# Patient Record
Sex: Female | Born: 1960 | Race: White | Hispanic: No | Marital: Married | Smoking: Never smoker
Health system: Southern US, Community
[De-identification: ages and names within clinical notes are randomized; demographics above are authoritative.]

## PROBLEM LIST (undated history)

## (undated) DIAGNOSIS — Z862 Personal history of diseases of the blood and blood-forming organs and certain disorders involving the immune mechanism: Secondary | ICD-10-CM

## (undated) DIAGNOSIS — Z860100 Personal history of colon polyps, unspecified: Secondary | ICD-10-CM

## (undated) DIAGNOSIS — E119 Type 2 diabetes mellitus without complications: Secondary | ICD-10-CM

## (undated) DIAGNOSIS — K219 Gastro-esophageal reflux disease without esophagitis: Secondary | ICD-10-CM

## (undated) DIAGNOSIS — M5126 Other intervertebral disc displacement, lumbar region: Secondary | ICD-10-CM

## (undated) DIAGNOSIS — K7581 Nonalcoholic steatohepatitis (NASH): Secondary | ICD-10-CM

## (undated) DIAGNOSIS — F419 Anxiety disorder, unspecified: Secondary | ICD-10-CM

## (undated) DIAGNOSIS — Z8619 Personal history of other infectious and parasitic diseases: Secondary | ICD-10-CM

## (undated) DIAGNOSIS — F32A Depression, unspecified: Secondary | ICD-10-CM

## (undated) DIAGNOSIS — G4730 Sleep apnea, unspecified: Secondary | ICD-10-CM

## (undated) DIAGNOSIS — I10 Essential (primary) hypertension: Secondary | ICD-10-CM

## (undated) DIAGNOSIS — M5030 Other cervical disc degeneration, unspecified cervical region: Secondary | ICD-10-CM

## (undated) DIAGNOSIS — Z9889 Other specified postprocedural states: Secondary | ICD-10-CM

## (undated) DIAGNOSIS — J40 Bronchitis, not specified as acute or chronic: Secondary | ICD-10-CM

## (undated) DIAGNOSIS — R200 Anesthesia of skin: Secondary | ICD-10-CM

## (undated) DIAGNOSIS — S0300XA Dislocation of jaw, unspecified side, initial encounter: Secondary | ICD-10-CM

## (undated) DIAGNOSIS — T7840XA Allergy, unspecified, initial encounter: Secondary | ICD-10-CM

## (undated) DIAGNOSIS — K589 Irritable bowel syndrome without diarrhea: Secondary | ICD-10-CM

## (undated) DIAGNOSIS — M5136 Other intervertebral disc degeneration, lumbar region: Secondary | ICD-10-CM

## (undated) DIAGNOSIS — N901 Moderate vulvar dysplasia: Secondary | ICD-10-CM

## (undated) DIAGNOSIS — H6690 Otitis media, unspecified, unspecified ear: Secondary | ICD-10-CM

## (undated) DIAGNOSIS — F329 Major depressive disorder, single episode, unspecified: Secondary | ICD-10-CM

## (undated) DIAGNOSIS — R112 Nausea with vomiting, unspecified: Secondary | ICD-10-CM

## (undated) DIAGNOSIS — B159 Hepatitis A without hepatic coma: Secondary | ICD-10-CM

## (undated) DIAGNOSIS — E785 Hyperlipidemia, unspecified: Secondary | ICD-10-CM

## (undated) DIAGNOSIS — Z973 Presence of spectacles and contact lenses: Secondary | ICD-10-CM

## (undated) DIAGNOSIS — W57XXXA Bitten or stung by nonvenomous insect and other nonvenomous arthropods, initial encounter: Secondary | ICD-10-CM

## (undated) DIAGNOSIS — M51369 Other intervertebral disc degeneration, lumbar region without mention of lumbar back pain or lower extremity pain: Secondary | ICD-10-CM

## (undated) HISTORY — PX: WRIST SURGERY: SHX841

## (undated) HISTORY — DX: Allergy, unspecified, initial encounter: T78.40XA

## (undated) HISTORY — PX: TUBAL LIGATION: SHX77

## (undated) HISTORY — DX: Depression, unspecified: F32.A

## (undated) HISTORY — DX: Anxiety disorder, unspecified: F41.9

## (undated) HISTORY — DX: Essential (primary) hypertension: I10

## (undated) HISTORY — DX: Gastro-esophageal reflux disease without esophagitis: K21.9

## (undated) HISTORY — DX: Type 2 diabetes mellitus without complications: E11.9

## (undated) HISTORY — PX: OTHER SURGICAL HISTORY: SHX169

## (undated) HISTORY — DX: Major depressive disorder, single episode, unspecified: F32.9

## (undated) HISTORY — PX: CRYOTHERAPY: SHX1416

## (undated) HISTORY — DX: Hyperlipidemia, unspecified: E78.5

---

## 1999-05-25 ENCOUNTER — Other Ambulatory Visit: Admission: RE | Admit: 1999-05-25 | Discharge: 1999-05-25 | Payer: Self-pay | Admitting: *Deleted

## 2000-04-05 ENCOUNTER — Encounter: Payer: Self-pay | Admitting: *Deleted

## 2000-04-05 ENCOUNTER — Ambulatory Visit (HOSPITAL_COMMUNITY): Admission: RE | Admit: 2000-04-05 | Discharge: 2000-04-05 | Payer: Self-pay | Admitting: *Deleted

## 2003-05-21 ENCOUNTER — Other Ambulatory Visit: Admission: RE | Admit: 2003-05-21 | Discharge: 2003-05-21 | Payer: Self-pay | Admitting: *Deleted

## 2003-05-27 ENCOUNTER — Encounter: Payer: Self-pay | Admitting: *Deleted

## 2003-05-27 ENCOUNTER — Ambulatory Visit (HOSPITAL_COMMUNITY): Admission: RE | Admit: 2003-05-27 | Discharge: 2003-05-27 | Payer: Self-pay | Admitting: *Deleted

## 2003-11-17 ENCOUNTER — Ambulatory Visit (HOSPITAL_BASED_OUTPATIENT_CLINIC_OR_DEPARTMENT_OTHER): Admission: RE | Admit: 2003-11-17 | Discharge: 2003-11-17 | Payer: Self-pay | Admitting: Family Medicine

## 2004-07-07 ENCOUNTER — Other Ambulatory Visit: Admission: RE | Admit: 2004-07-07 | Discharge: 2004-07-07 | Payer: Self-pay | Admitting: *Deleted

## 2005-02-03 ENCOUNTER — Ambulatory Visit: Payer: Self-pay | Admitting: Psychiatry

## 2005-08-03 ENCOUNTER — Other Ambulatory Visit: Admission: RE | Admit: 2005-08-03 | Discharge: 2005-08-03 | Payer: Self-pay | Admitting: *Deleted

## 2005-08-11 ENCOUNTER — Encounter: Admission: RE | Admit: 2005-08-11 | Discharge: 2005-08-11 | Payer: Self-pay | Admitting: *Deleted

## 2006-01-04 ENCOUNTER — Emergency Department: Payer: Self-pay | Admitting: Internal Medicine

## 2006-05-18 ENCOUNTER — Ambulatory Visit: Payer: Self-pay | Admitting: Orthopedic Surgery

## 2006-09-18 ENCOUNTER — Encounter: Admission: RE | Admit: 2006-09-18 | Discharge: 2006-09-18 | Payer: Self-pay | Admitting: *Deleted

## 2007-10-09 ENCOUNTER — Ambulatory Visit (HOSPITAL_COMMUNITY): Admission: RE | Admit: 2007-10-09 | Discharge: 2007-10-09 | Payer: Self-pay | Admitting: *Deleted

## 2007-11-07 ENCOUNTER — Other Ambulatory Visit: Admission: RE | Admit: 2007-11-07 | Discharge: 2007-11-07 | Payer: Self-pay | Admitting: *Deleted

## 2008-06-09 ENCOUNTER — Ambulatory Visit: Payer: Self-pay | Admitting: Internal Medicine

## 2008-06-09 DIAGNOSIS — R51 Headache: Secondary | ICD-10-CM | POA: Insufficient documentation

## 2008-06-09 DIAGNOSIS — J309 Allergic rhinitis, unspecified: Secondary | ICD-10-CM | POA: Insufficient documentation

## 2008-06-09 DIAGNOSIS — E785 Hyperlipidemia, unspecified: Secondary | ICD-10-CM | POA: Insufficient documentation

## 2008-06-09 DIAGNOSIS — Z860100 Personal history of colon polyps, unspecified: Secondary | ICD-10-CM | POA: Insufficient documentation

## 2008-06-09 DIAGNOSIS — K219 Gastro-esophageal reflux disease without esophagitis: Secondary | ICD-10-CM | POA: Insufficient documentation

## 2008-06-09 DIAGNOSIS — F32A Depression, unspecified: Secondary | ICD-10-CM | POA: Insufficient documentation

## 2008-06-09 DIAGNOSIS — I152 Hypertension secondary to endocrine disorders: Secondary | ICD-10-CM | POA: Insufficient documentation

## 2008-06-09 DIAGNOSIS — F419 Anxiety disorder, unspecified: Secondary | ICD-10-CM | POA: Insufficient documentation

## 2008-06-09 DIAGNOSIS — F329 Major depressive disorder, single episode, unspecified: Secondary | ICD-10-CM

## 2008-06-09 DIAGNOSIS — I10 Essential (primary) hypertension: Secondary | ICD-10-CM | POA: Insufficient documentation

## 2008-06-09 DIAGNOSIS — E1169 Type 2 diabetes mellitus with other specified complication: Secondary | ICD-10-CM | POA: Insufficient documentation

## 2008-06-09 DIAGNOSIS — R519 Headache, unspecified: Secondary | ICD-10-CM | POA: Insufficient documentation

## 2008-07-11 ENCOUNTER — Ambulatory Visit: Payer: Self-pay | Admitting: Gastroenterology

## 2008-12-11 ENCOUNTER — Ambulatory Visit (HOSPITAL_COMMUNITY): Admission: RE | Admit: 2008-12-11 | Discharge: 2008-12-11 | Payer: Self-pay | Admitting: Gynecology

## 2008-12-26 HISTORY — PX: COLONOSCOPY: SHX174

## 2009-07-29 ENCOUNTER — Ambulatory Visit: Payer: Self-pay | Admitting: Family Medicine

## 2009-08-03 LAB — HM COLONOSCOPY

## 2009-08-13 ENCOUNTER — Ambulatory Visit: Payer: Self-pay | Admitting: Gastroenterology

## 2009-12-17 ENCOUNTER — Ambulatory Visit: Payer: Self-pay | Admitting: Chiropractic Medicine

## 2010-12-08 LAB — HM PAP SMEAR: HM PAP: NEGATIVE

## 2011-10-03 ENCOUNTER — Ambulatory Visit: Payer: Self-pay | Admitting: Family Medicine

## 2011-10-27 ENCOUNTER — Ambulatory Visit: Payer: Self-pay | Admitting: Family Medicine

## 2011-11-26 ENCOUNTER — Ambulatory Visit: Payer: Self-pay | Admitting: Family Medicine

## 2013-04-30 ENCOUNTER — Ambulatory Visit: Payer: Self-pay | Admitting: Orthopedic Surgery

## 2013-06-17 ENCOUNTER — Other Ambulatory Visit: Payer: Self-pay | Admitting: Gynecology

## 2013-06-17 DIAGNOSIS — R928 Other abnormal and inconclusive findings on diagnostic imaging of breast: Secondary | ICD-10-CM

## 2013-07-19 ENCOUNTER — Ambulatory Visit
Admission: RE | Admit: 2013-07-19 | Discharge: 2013-07-19 | Disposition: A | Discharge status: Home / Self Care | Payer: BC Managed Care – PPO | Source: Ambulatory Visit | Attending: Gynecology | Admitting: Gynecology

## 2013-07-19 DIAGNOSIS — R928 Other abnormal and inconclusive findings on diagnostic imaging of breast: Secondary | ICD-10-CM

## 2013-11-25 ENCOUNTER — Ambulatory Visit: Payer: Self-pay | Admitting: Family Medicine

## 2015-01-23 LAB — HEMOGLOBIN A1C: HEMOGLOBIN A1C: 6.4 % — AB (ref 4.0–6.0)

## 2015-03-04 LAB — HEPATIC FUNCTION PANEL
ALT: 46 U/L — AB (ref 7–35)
AST: 27 U/L (ref 13–35)

## 2015-03-04 LAB — BASIC METABOLIC PANEL
BUN: 15 mg/dL (ref 4–21)
Creatinine: 0.8 mg/dL (ref 0.5–1.1)
GLUCOSE: 170 mg/dL
Potassium: 4 mmol/L (ref 3.4–5.3)
Sodium: 139 mmol/L (ref 137–147)

## 2015-03-04 LAB — CBC AND DIFFERENTIAL
HCT: 39 % (ref 36–46)
Hemoglobin: 13.2 g/dL (ref 12.0–16.0)
PLATELETS: 291 10*3/uL (ref 150–399)
WBC: 7.5 10*3/mL

## 2015-08-07 ENCOUNTER — Other Ambulatory Visit: Payer: Self-pay | Admitting: Family Medicine

## 2015-08-07 DIAGNOSIS — F32A Depression, unspecified: Secondary | ICD-10-CM

## 2015-08-07 DIAGNOSIS — F329 Major depressive disorder, single episode, unspecified: Secondary | ICD-10-CM

## 2015-08-07 NOTE — Telephone Encounter (Signed)
Last ov 01/23/15

## 2015-08-28 ENCOUNTER — Other Ambulatory Visit: Payer: Self-pay | Admitting: Family Medicine

## 2015-08-28 DIAGNOSIS — B084 Enteroviral vesicular stomatitis with exanthem: Secondary | ICD-10-CM | POA: Insufficient documentation

## 2015-08-28 DIAGNOSIS — M8580 Other specified disorders of bone density and structure, unspecified site: Secondary | ICD-10-CM | POA: Insufficient documentation

## 2015-08-28 DIAGNOSIS — I10 Essential (primary) hypertension: Secondary | ICD-10-CM | POA: Insufficient documentation

## 2015-08-28 DIAGNOSIS — I8390 Asymptomatic varicose veins of unspecified lower extremity: Secondary | ICD-10-CM | POA: Insufficient documentation

## 2015-08-28 DIAGNOSIS — G8929 Other chronic pain: Secondary | ICD-10-CM | POA: Insufficient documentation

## 2015-08-28 DIAGNOSIS — E559 Vitamin D deficiency, unspecified: Secondary | ICD-10-CM | POA: Insufficient documentation

## 2015-08-28 DIAGNOSIS — N938 Other specified abnormal uterine and vaginal bleeding: Secondary | ICD-10-CM | POA: Insufficient documentation

## 2015-08-28 DIAGNOSIS — M19079 Primary osteoarthritis, unspecified ankle and foot: Secondary | ICD-10-CM | POA: Insufficient documentation

## 2015-08-28 DIAGNOSIS — K219 Gastro-esophageal reflux disease without esophagitis: Secondary | ICD-10-CM | POA: Insufficient documentation

## 2015-08-28 DIAGNOSIS — G5600 Carpal tunnel syndrome, unspecified upper limb: Secondary | ICD-10-CM | POA: Insufficient documentation

## 2015-08-28 DIAGNOSIS — Z91010 Allergy to peanuts: Secondary | ICD-10-CM | POA: Insufficient documentation

## 2015-08-28 DIAGNOSIS — E119 Type 2 diabetes mellitus without complications: Secondary | ICD-10-CM | POA: Insufficient documentation

## 2015-08-28 DIAGNOSIS — R040 Epistaxis: Secondary | ICD-10-CM | POA: Insufficient documentation

## 2015-08-28 DIAGNOSIS — M7060 Trochanteric bursitis, unspecified hip: Secondary | ICD-10-CM | POA: Insufficient documentation

## 2015-08-28 DIAGNOSIS — M109 Gout, unspecified: Secondary | ICD-10-CM | POA: Insufficient documentation

## 2015-08-28 DIAGNOSIS — R202 Paresthesia of skin: Secondary | ICD-10-CM | POA: Insufficient documentation

## 2015-08-28 DIAGNOSIS — M545 Low back pain, unspecified: Secondary | ICD-10-CM | POA: Insufficient documentation

## 2015-08-28 DIAGNOSIS — B341 Enterovirus infection, unspecified: Secondary | ICD-10-CM | POA: Insufficient documentation

## 2015-08-28 DIAGNOSIS — F32A Depression, unspecified: Secondary | ICD-10-CM | POA: Insufficient documentation

## 2015-08-28 DIAGNOSIS — R319 Hematuria, unspecified: Secondary | ICD-10-CM | POA: Insufficient documentation

## 2015-08-28 DIAGNOSIS — K7581 Nonalcoholic steatohepatitis (NASH): Secondary | ICD-10-CM | POA: Insufficient documentation

## 2015-08-28 DIAGNOSIS — N809 Endometriosis, unspecified: Secondary | ICD-10-CM | POA: Insufficient documentation

## 2015-08-28 DIAGNOSIS — F4320 Adjustment disorder, unspecified: Secondary | ICD-10-CM | POA: Insufficient documentation

## 2015-08-28 DIAGNOSIS — B9711 Coxsackievirus as the cause of diseases classified elsewhere: Secondary | ICD-10-CM | POA: Insufficient documentation

## 2015-08-28 DIAGNOSIS — F329 Major depressive disorder, single episode, unspecified: Secondary | ICD-10-CM | POA: Insufficient documentation

## 2015-08-28 DIAGNOSIS — Z91030 Bee allergy status: Secondary | ICD-10-CM | POA: Insufficient documentation

## 2015-08-28 DIAGNOSIS — E7800 Pure hypercholesterolemia, unspecified: Secondary | ICD-10-CM | POA: Insufficient documentation

## 2015-09-12 ENCOUNTER — Ambulatory Visit: Payer: Self-pay

## 2015-09-18 ENCOUNTER — Ambulatory Visit (INDEPENDENT_AMBULATORY_CARE_PROVIDER_SITE_OTHER): Payer: BLUE CROSS/BLUE SHIELD | Admitting: Family Medicine

## 2015-09-18 ENCOUNTER — Encounter: Payer: Self-pay | Admitting: Family Medicine

## 2015-09-18 VITALS — BP 120/72 | HR 80 | Temp 98.4°F | Resp 16 | Wt 165.0 lb

## 2015-09-18 DIAGNOSIS — F32A Depression, unspecified: Secondary | ICD-10-CM

## 2015-09-18 DIAGNOSIS — B354 Tinea corporis: Secondary | ICD-10-CM | POA: Diagnosis not present

## 2015-09-18 DIAGNOSIS — E119 Type 2 diabetes mellitus without complications: Secondary | ICD-10-CM

## 2015-09-18 DIAGNOSIS — F329 Major depressive disorder, single episode, unspecified: Secondary | ICD-10-CM | POA: Diagnosis not present

## 2015-09-18 DIAGNOSIS — Z23 Encounter for immunization: Secondary | ICD-10-CM | POA: Diagnosis not present

## 2015-09-18 DIAGNOSIS — E785 Hyperlipidemia, unspecified: Secondary | ICD-10-CM

## 2015-09-18 DIAGNOSIS — I10 Essential (primary) hypertension: Secondary | ICD-10-CM

## 2015-09-18 LAB — POCT GLYCOSYLATED HEMOGLOBIN (HGB A1C): HEMOGLOBIN A1C: 7.8

## 2015-09-18 MED ORDER — METFORMIN HCL 1000 MG PO TABS: 1000.0000 mg | ORAL_TABLET | Freq: Two times a day (BID) | ORAL | Status: DC

## 2015-09-18 MED ORDER — NYSTATIN 100000 UNIT/GM EX OINT: 1.0000 "application " | TOPICAL_OINTMENT | Freq: Two times a day (BID) | CUTANEOUS | Status: DC

## 2015-09-18 NOTE — Progress Notes (Signed)
Patient ID: Hailey Horton, female   DOB: 05/03/1961, 54 y.o.   MRN: [id number]       Patient: Hailey Horton Female    DOB: Mar 27, 1961   54 y.o.   MRN: [id number] Visit Date: 09/18/2015  Today's Provider: Margarita Rana, MD   Chief Complaint  Patient presents with  . Diabetes  . Rash   Subjective:    HPI   Diabetes Mellitus Type II, Follow-up:   Lab Results  Component Value Date   HGBA1C 6.4* 01/23/2015   Last seen for diabetes 9 months ago.  Management since then includes none. She reports good compliance with treatment. She is not having side effects.  Current symptoms include none. Home blood sugar records: 120-150's  Episodes of hypoglycemia? no   Current Insulin Regimen: n/a Most Recent Eye Exam: within a year Current exercise: none  ------------------------------------------------------------------------   Hypertension, follow-up:  BP Readings from Last 3 Encounters:  09/18/15 120/72  04/28/15 116/78  06/09/08 124/88    She was last seen for hypertension 9 months ago.  BP at that visit was 116/78. Management since that visit includes none .She reports good compliance with treatment. She is not having side effects.  She is not exercising. Outside blood pressures are not being checked.  Patient denies chest pain, chest pressure/discomfort and dyspnea.    ------------------------------------------------------------------------    Lipid/Cholesterol, Follow-up:   Last seen for this 9 months ago.  Management since that visit includes none.  Last Lipid Panel: No results found for: CHOL, TRIG, HDL, CHOLHDL, VLDL, LDLCALC, LDLDIRECT  She reports good compliance with treatment. She is not having side effects.   Wt Readings from Last 3 Encounters:  09/18/15 165 lb (74.844 kg)  04/28/15 166 lb (75.297 kg)  06/09/08 166 lb (75.297 kg)    ------------------------------------------------------------------------   Pt reports that she has had a  red rash under both her breast for 3 weeks. She reports that it is pain and she has cracks in the skin. She has tried baby powder, cold packs, gold bond, Cut green, aloe, and diaper rash cream. None of these has worked. She reports that the area itches. She reports that she has been sweating a lot because of the weather and not being able to wear a bra.       Allergies  Allergen Reactions  . Acetaminophen-Codeine   . Clarithromycin     GI upset  . Codeine Phosphate     REACTION: difficulty breathing; vomiting  . Hydrochlorothiazide   . Penicillins     REACTION: difficulty breathing; rash; vomiting  . Peanuts  [Peanut Oil] Swelling  . Sulfacetamide Sodium     REACTION: difficulty breathing, rash, vomiting  . Sulfa Antibiotics Rash   Previous Medications   ALBUTEROL (PROAIR HFA) 108 (90 BASE) MCG/ACT INHALER    Inhale 1 spray into the lungs as needed.   ALPRAZOLAM (XANAX) 0.5 MG TABLET    Take by mouth. 1/2 -1 tablet 2 times a day as needed.   ASPIRIN 81 MG TABLET    Take 1 tablet by mouth daily.   EPINEPHRINE (EPIPEN 2-PAK) 0.3 MG/0.3 ML IJ SOAJ INJECTION    EPIPEN 2-PAK, 0.3MG/0.3ML (Injection Solution Auto-injector)  1 Device as needed for 0 days  Quantity: 1;  Refills: 2   Ordered :14-Apr-2015  Margarita Rana MD;  Started 14-Apr-2015 Active Comments: Medication taken as needed.    ESCITALOPRAM (LEXAPRO) 20 MG TABLET    TAKE 1 TABLET BY MOUTH EVERY DAY  ESOMEPRAZOLE (NEXIUM) 40 MG CAPSULE    Take 1 capsule by mouth daily.   FLUTICASONE (FLONASE) 50 MCG/ACT NASAL SPRAY    Place 2 sprays into the nose daily.   METFORMIN (GLUCOPHAGE) 500 MG TABLET    TAKE 1 TABLET TWICE DAILY   QUINAPRIL (ACCUPRIL) 20 MG TABLET    Take 1 tablet by mouth daily.   SIMVASTATIN (ZOCOR) 20 MG TABLET    Take 1 tablet by mouth at bedtime.   VITAMIN D, ERGOCALCIFEROL, (DRISDOL) 50000 UNITS CAPS CAPSULE    Take 1 capsule by mouth every 30 (thirty) days.    Review of Systems  Constitutional: Negative.     HENT: Negative.   Eyes: Negative.   Respiratory: Negative.   Cardiovascular: Negative.   Gastrointestinal: Negative.   Endocrine: Negative.   Genitourinary: Negative.   Musculoskeletal: Negative.   Skin: Positive for rash.  Allergic/Immunologic: Negative.   Neurological: Negative.   Hematological: Negative.   Psychiatric/Behavioral: Negative.     Social History  Substance Use Topics  . Smoking status: Never Smoker   . Smokeless tobacco: Not on file  . Alcohol Use: No   Objective:   BP 120/72 mmHg  Pulse 80  Temp(Src) 98.4 F (36.9 C) (Oral)  Resp 16  Wt 165 lb (74.844 kg)  Physical Exam  Constitutional: She is oriented to person, place, and time. She appears well-developed and well-nourished.  Cardiovascular: Normal rate and regular rhythm.   Pulmonary/Chest: Effort normal and breath sounds normal.  Neurological: She is alert and oriented to person, place, and time.  Skin: There is erythema.  Rash with satellite lesions under breasts bilaterally.   Psychiatric: She has a normal mood and affect. Her behavior is normal. Judgment and thought content normal.      Assessment & Plan:     1. Essential hypertension Condition is stable. Please continue current medication and  plan of care as noted.    2. Type 2 diabetes mellitus without complication Diabetes not under as good of control as would like. Will increase Metformin and work on diet and exercise.  Recheck in 3 months.    - POCT HgB A1C - metFORMIN (GLUCOPHAGE) 1000 MG tablet; Take 1 tablet (1,000 mg total) by mouth 2 (two) times daily.  Dispense: 60 tablet; Refill: 5 Results for orders placed or performed in visit on 09/18/15  POCT HgB A1C  Result Value Ref Range   Hemoglobin A1C 7.8     3. Hyperlipidemia Stable.   4. Depression Will monitor. Has had a lot of changes.    5. Need for influenza vaccination - Flu Vaccine QUAD 36+ mos IM  6. Tinea corporis Condition is worsening. Will start medication  for better control.  Patient instructed to call back if condition worsens or does not improve.    - nystatin ointment (MYCOSTATIN); Apply 1 application topically 2 (two) times daily. To affected area  Dispense: 60 g; Refill: 1     Patient was seen and examined by Jerrell Belfast, MD, and note scribed by Webb Laws, [place].  I have reviewed the document for accuracy and completeness and I agree with above. - Jerrell Belfast, MD   Margarita Rana, MD  [facility]

## 2015-10-06 ENCOUNTER — Other Ambulatory Visit: Payer: Self-pay | Admitting: Family Medicine

## 2015-10-06 DIAGNOSIS — E119 Type 2 diabetes mellitus without complications: Secondary | ICD-10-CM

## 2015-10-06 NOTE — Telephone Encounter (Signed)
Needs refill on one touch ultra blue strips.  Uses [facility]  [address]  Call back is [phone]  Thanks Con Memos

## 2015-10-07 MED ORDER — GLUCOSE BLOOD VI STRP: ORAL_STRIP | Status: DC

## 2015-10-08 ENCOUNTER — Other Ambulatory Visit: Payer: Self-pay | Admitting: Family Medicine

## 2015-10-08 ENCOUNTER — Telehealth: Payer: Self-pay | Admitting: Family Medicine

## 2015-10-08 DIAGNOSIS — F419 Anxiety disorder, unspecified: Secondary | ICD-10-CM

## 2015-10-08 MED ORDER — ALPRAZOLAM 0.5 MG PO TABS: ORAL_TABLET | ORAL | Status: DC

## 2015-10-08 NOTE — Telephone Encounter (Signed)
Pt contacted office for refill request on the following medications:  ALPRAZolam (XANAX) 0.5 MG.  [facility].  [phone]

## 2015-10-08 NOTE — Telephone Encounter (Signed)
Printed, please fax or call in to pharmacy. Thank you.

## 2015-10-08 NOTE — Telephone Encounter (Signed)
Pt contacted office for refill request on the following medications:

## 2015-10-21 ENCOUNTER — Other Ambulatory Visit: Payer: Self-pay | Admitting: Family Medicine

## 2015-10-21 DIAGNOSIS — I10 Essential (primary) hypertension: Secondary | ICD-10-CM

## 2015-11-05 ENCOUNTER — Other Ambulatory Visit: Payer: Self-pay | Admitting: Family Medicine

## 2015-11-05 ENCOUNTER — Other Ambulatory Visit: Payer: Self-pay

## 2015-11-05 DIAGNOSIS — K219 Gastro-esophageal reflux disease without esophagitis: Secondary | ICD-10-CM

## 2015-11-05 DIAGNOSIS — F329 Major depressive disorder, single episode, unspecified: Secondary | ICD-10-CM

## 2015-11-05 DIAGNOSIS — F32A Depression, unspecified: Secondary | ICD-10-CM

## 2015-11-05 MED ORDER — ESOMEPRAZOLE MAGNESIUM 40 MG PO CPDR: 40.0000 mg | DELAYED_RELEASE_CAPSULE | Freq: Every day | ORAL | Status: DC

## 2015-12-09 ENCOUNTER — Other Ambulatory Visit: Payer: Self-pay | Admitting: Family Medicine

## 2015-12-09 DIAGNOSIS — E785 Hyperlipidemia, unspecified: Secondary | ICD-10-CM

## 2015-12-14 ENCOUNTER — Ambulatory Visit: Payer: BLUE CROSS/BLUE SHIELD | Admitting: Family Medicine

## 2015-12-15 ENCOUNTER — Ambulatory Visit: Payer: Self-pay | Admitting: Family Medicine

## 2015-12-18 ENCOUNTER — Ambulatory Visit: Payer: BLUE CROSS/BLUE SHIELD | Admitting: Family Medicine

## 2016-02-03 ENCOUNTER — Ambulatory Visit: Payer: BLUE CROSS/BLUE SHIELD | Admitting: Family Medicine

## 2016-03-18 ENCOUNTER — Ambulatory Visit: Payer: BLUE CROSS/BLUE SHIELD | Admitting: Family Medicine

## 2016-03-28 ENCOUNTER — Ambulatory Visit (INDEPENDENT_AMBULATORY_CARE_PROVIDER_SITE_OTHER): Payer: BLUE CROSS/BLUE SHIELD | Admitting: Family Medicine

## 2016-03-28 ENCOUNTER — Encounter: Payer: Self-pay | Admitting: Family Medicine

## 2016-03-28 VITALS — BP 116/68 | HR 68 | Temp 97.6°F | Resp 16 | Wt 159.0 lb

## 2016-03-28 DIAGNOSIS — F32A Depression, unspecified: Secondary | ICD-10-CM

## 2016-03-28 DIAGNOSIS — I10 Essential (primary) hypertension: Secondary | ICD-10-CM

## 2016-03-28 DIAGNOSIS — E119 Type 2 diabetes mellitus without complications: Secondary | ICD-10-CM

## 2016-03-28 DIAGNOSIS — J309 Allergic rhinitis, unspecified: Secondary | ICD-10-CM | POA: Diagnosis not present

## 2016-03-28 DIAGNOSIS — F419 Anxiety disorder, unspecified: Secondary | ICD-10-CM | POA: Diagnosis not present

## 2016-03-28 DIAGNOSIS — F329 Major depressive disorder, single episode, unspecified: Secondary | ICD-10-CM

## 2016-03-28 DIAGNOSIS — E559 Vitamin D deficiency, unspecified: Secondary | ICD-10-CM | POA: Diagnosis not present

## 2016-03-28 DIAGNOSIS — E7800 Pure hypercholesterolemia, unspecified: Secondary | ICD-10-CM

## 2016-03-28 LAB — POCT GLYCOSYLATED HEMOGLOBIN (HGB A1C): Hemoglobin A1C: 6.7

## 2016-03-28 LAB — POCT UA - MICROALBUMIN: Microalbumin Ur, POC: 20 mg/L

## 2016-03-28 MED ORDER — VITAMIN D (ERGOCALCIFEROL) 1.25 MG (50000 UNIT) PO CAPS: 50000.0000 [IU] | ORAL_CAPSULE | ORAL | Status: DC

## 2016-03-28 MED ORDER — QUINAPRIL HCL 20 MG PO TABS: 20.0000 mg | ORAL_TABLET | Freq: Every day | ORAL | Status: DC

## 2016-03-28 MED ORDER — FLUTICASONE PROPIONATE 50 MCG/ACT NA SUSP: 2.0000 | Freq: Every day | NASAL | Status: DC

## 2016-03-28 MED ORDER — MIRTAZAPINE 15 MG PO TBDP: 15.0000 mg | ORAL_TABLET | Freq: Every day | ORAL | Status: DC

## 2016-03-28 NOTE — Progress Notes (Signed)
Patient ID: Hailey Horton, female   DOB: Jan 20, 1961, 55 y.o.   MRN: [id number]        Patient: Hailey Horton Female    DOB: Feb 05, 1961   55 y.o.   MRN: [id number] Visit Date: 03/28/2016  Today's Provider: Margarita Rana, MD   Chief Complaint  Patient presents with  . Diabetes  . Hypertension  . Hyperlipidemia  . Depression  . Anxiety   Subjective:    Depression        This is a chronic problem.  The problem has been gradually worsening since onset.  Associated symptoms include decreased concentration, fatigue, appetite change (Pt reports not having much of an appetite.), sad and suicidal ideas.  Associated symptoms include does not have insomnia, no restlessness, no myalgias and no headaches.  Past treatments include SSRIs - Selective serotonin reuptake inhibitors.  Compliance with treatment is good.  Past medical history includes anxiety and suicide attempts (Pt report "cutting her wrists as a teenager"  but "Not very deep").   Anxiety Presents for follow-up visit. The problem has been gradually worsening. Symptoms include chest pain (Pt describes this as a "Sharpe chest pain"  ), decreased concentration, depressed mood, excessive worry, nausea, nervous/anxious behavior, panic and suicidal ideas. Patient reports no compulsions, confusion, dizziness, feeling of choking, hyperventilation, impotence, insomnia, irritability, malaise, muscle tension, obsessions, palpitations, restlessness or shortness of breath. The symptoms are aggravated by family issues (Pt reports recently starting a new job, but the anxiety/depression has been worsening since before then. ). The quality of sleep is poor. Nighttime awakenings: several.   Her past medical history is significant for anxiety/panic attacks and suicide attempts (Pt report "cutting her wrists as a teenager"  but "Not very deep"). Past treatments include SSRIs. The treatment provided no relief. Compliance with prior treatments has been good.      Diabetes Mellitus Type II, Follow-up:   Lab Results  Component Value Date   HGBA1C 6.7 03/28/2016   HGBA1C 7.8 09/18/2015   HGBA1C 6.4* 01/23/2015   Last seen for diabetes 6 months ago.  Management since then includes Increased Metformin to 1,038m twice a day.  Pt reports having to go back to the 5030mtwice a day secondary to the increase caused diarrhea. She reports good compliance with treatment.  She is not able to take Metformin everyday twice a day secondary to diarrhea. She is having side effects. Pt reports even taking 50030mwice a day causes occasional diarrhea. Current symptoms include paresthesia of the feet and have been stable. Home blood sugar records: low to mid 100's.  Episodes of hypoglycemia? yes - May have had a low last week.   Most Recent Eye Exam: Has been over a year Weight trend: stable Current exercise: walking  ------------------------------------------------------------------------   Hypertension, follow-up:  BP Readings from Last 3 Encounters:  03/28/16 116/68  09/18/15 120/72  04/28/15 116/78    She was last seen for hypertension 6 months ago.  Management since that visit includes None .She reports excellent compliance with treatment. She is not having side effects.  She is not exercising. She is not adherent to low salt diet.   She is experiencing fatigue.  Patient denies none.   Cardiovascular risk factors include diabetes mellitus and hypertension.  ------------------------------------------------------------------------    Lipid/Cholesterol, Follow-up:   Last seen for this 6 months ago.  Management since that visit includes None.  Last Lipid Panel: No results found for: CHOL, TRIG, HDL, CHOLHDL, VLDL, LDLCALC, LDLDIRECT  She reports  excellent compliance with treatment. She is having side effects.   Wt Readings from Last 3 Encounters:  03/28/16 159 lb (72.122 kg)  09/18/15 165 lb (74.844 kg)  04/28/15 166 lb (75.297 kg)     ------------------------------------------------------------------------        Allergies  Allergen Reactions  . Acetaminophen-Codeine   . Clarithromycin     GI upset  . Codeine Phosphate     REACTION: difficulty breathing; vomiting  . Hydrochlorothiazide   . Penicillins     REACTION: difficulty breathing; rash; vomiting  . Peanuts  [Peanut Oil] Swelling  . Sulfacetamide Sodium     REACTION: difficulty breathing, rash, vomiting  . Sulfa Antibiotics Rash   Previous Medications   ALBUTEROL (PROAIR HFA) 108 (90 BASE) MCG/ACT INHALER    Inhale 1 spray into the lungs as needed.   ALPRAZOLAM (XANAX) 0.5 MG TABLET    1/2 -1 tablet 2 times a day as needed.   ASPIRIN 81 MG TABLET    Take 1 tablet by mouth daily.   EPINEPHRINE (EPIPEN 2-PAK) 0.3 MG/0.3 ML IJ SOAJ INJECTION    EPIPEN 2-PAK, 0.3MG/0.3ML (Injection Solution Auto-injector)  1 Device as needed for 0 days  Quantity: 1;  Refills: 2   Ordered :14-Apr-2015  Margarita Rana MD;  Started 14-Apr-2015 Active Comments: Medication taken as needed.    ESCITALOPRAM (LEXAPRO) 20 MG TABLET    TAKE 1 TABLET BY MOUTH EVERY DAY   ESOMEPRAZOLE (NEXIUM) 40 MG CAPSULE    Take 1 capsule (40 mg total) by mouth daily.   GLUCOSE BLOOD (ONE TOUCH ULTRA TEST) TEST STRIP    To check blood sugar once a day.  DX: E11.9   METFORMIN (GLUCOPHAGE) 500 MG TABLET    Take 500 mg by mouth 2 (two) times daily.   NYSTATIN OINTMENT (MYCOSTATIN)    Apply 1 application topically 2 (two) times daily. To affected area   SIMVASTATIN (ZOCOR) 20 MG TABLET    TAKE 1 TABLET BY MOUTH AT BEDTIME    Review of Systems  Constitutional: Positive for appetite change (Pt reports not having much of an appetite.) and fatigue. Negative for fever, diaphoresis, activity change, irritability and unexpected weight change.  Respiratory: Positive for cough (Has a little cough and sore throat. ). Negative for apnea, choking, chest tightness, shortness of breath, wheezing and  stridor.   Cardiovascular: Positive for chest pain (Pt describes this as a "Sharpe chest pain"  ). Negative for palpitations and leg swelling.  Gastrointestinal: Positive for nausea, abdominal pain and diarrhea. Negative for vomiting, constipation, blood in stool, abdominal distention, anal bleeding and rectal pain.  Endocrine: Positive for polydipsia. Negative for cold intolerance, heat intolerance, polyphagia and polyuria.  Genitourinary: Negative for impotence.  Musculoskeletal: Positive for back pain, joint swelling and arthralgias. Negative for myalgias, gait problem, neck pain and neck stiffness.       Worsening back pain; now also left ankle pain.  Neurological: Negative for dizziness, light-headedness and headaches.  Psychiatric/Behavioral: Positive for depression, suicidal ideas, sleep disturbance, dysphoric mood and decreased concentration. Negative for hallucinations, behavioral problems, confusion, self-injury and agitation. The patient is nervous/anxious. The patient does not have insomnia and is not hyperactive.     Social History  Substance Use Topics  . Smoking status: Never Smoker   . Smokeless tobacco: Not on file  . Alcohol Use: No   Objective:   BP 116/68 mmHg  Pulse 68  Temp(Src) 97.6 F (36.4 C) (Oral)  Resp 16  Wt 159 lb (72.122  kg)  Results for orders placed or performed in visit on 03/28/16  POCT glycosylated hemoglobin (Hb A1C)  Result Value Ref Range   Hemoglobin A1C 6.7   POCT UA - Microalbumin  Result Value Ref Range   Microalbumin Ur, POC 20 mg/L    Physical Exam  Constitutional: She is oriented to person, place, and time. She appears well-developed and well-nourished.  Cardiovascular: Normal rate, regular rhythm and normal heart sounds.   Pulmonary/Chest: Effort normal and breath sounds normal.  Neurological: She is alert and oriented to person, place, and time.  Psychiatric: She has a normal mood and affect. Her behavior is normal. Judgment and  thought content normal.        Assessment & Plan:      1. Essential hypertension Stable; refills provided, will recheck labs.  Recheck at follow up.   - quinapril (ACCUPRIL) 20 MG tablet; Take 1 tablet (20 mg total) by mouth daily.  Dispense: 30 tablet; Refill: 5 - TSH - CBC with Differential/Platelet - Comprehensive metabolic panel  2. Type 2 diabetes mellitus without complication, without long-term current use of insulin (HCC) A1C improved at 6.7%; Microalbumin normal at 20.  Advised pt to continue Metformin 535m BID.  Recheck in 3 months.   - POCT glycosylated hemoglobin (Hb A1C) - POCT UA - Microalbumin - Comprehensive metabolic panel  3. Hypercholesteremia Stable will recheck labs today. - Lipid panel  4. Allergic rhinitis, unspecified allergic rhinitis type Stable Refills provided.  - fluticasone (FLONASE) 50 MCG/ACT nasal spray; Place 2 sprays into both nostrils daily.  Dispense: 16 g; Refill: 5  5. Depression Not at goal.  Contracted for safety.  Will add Mirtazapine and recheck in one week.  Warned about sedation for first few days that should improve.   - mirtazapine (REMERON SOL-TAB) 15 MG disintegrating tablet; Take 1 tablet (15 mg total) by mouth at bedtime. Take 1/2 tablet at bedtime for one week; then increase 1 tablet at night.  Dispense: 30 tablet; Refill: 5  6. Anxiety Not at goal. Will increase medication as noted.   - mirtazapine (REMERON SOL-TAB) 15 MG disintegrating tablet; Take 1 tablet (15 mg total) by mouth at bedtime. Take 1/2 tablet at bedtime for one week; then increase 1 tablet at night.  Dispense: 30 tablet; Refill: 5 - TSH  7. Avitaminosis D Refills provided; will recheck vitamin d today.   - Vitamin D, Ergocalciferol, (DRISDOL) 50000 units CAPS capsule; Take 1 capsule (50,000 Units total) by mouth every 30 (thirty) days.  Dispense: 3 capsule; Refill: 1 - VITAMIN D 25 Hydroxy (Vit-D Deficiency, Fractures) - CBC with Differential/Platelet       Patient was seen and examined by NJerrell Belfast MD, and note scribed by ERenaldo Fiddler CMA. I have reviewed the document for accuracy and completeness and I agree with above. - NJerrell Belfast MD   NMargarita Rana MD  [facility]

## 2016-03-29 LAB — CBC WITH DIFFERENTIAL/PLATELET
Basophils Absolute: 0 10*3/uL (ref 0.0–0.2)
Basos: 1 %
EOS (ABSOLUTE): 0.2 10*3/uL (ref 0.0–0.4)
EOS: 3 %
HEMATOCRIT: 41.1 % (ref 34.0–46.6)
Hemoglobin: 13.5 g/dL (ref 11.1–15.9)
IMMATURE GRANULOCYTES: 0 %
Immature Grans (Abs): 0 10*3/uL (ref 0.0–0.1)
Lymphocytes Absolute: 2.2 10*3/uL (ref 0.7–3.1)
Lymphs: 28 %
MCH: 29.1 pg (ref 26.6–33.0)
MCHC: 32.8 g/dL (ref 31.5–35.7)
MCV: 89 fL (ref 79–97)
MONOCYTES: 7 %
MONOS ABS: 0.5 10*3/uL (ref 0.1–0.9)
NEUTROS PCT: 61 %
Neutrophils Absolute: 4.8 10*3/uL (ref 1.4–7.0)
Platelets: 326 10*3/uL (ref 150–379)
RBC: 4.64 x10E6/uL (ref 3.77–5.28)
RDW: 14.6 % (ref 12.3–15.4)
WBC: 7.8 10*3/uL (ref 3.4–10.8)

## 2016-03-29 LAB — LIPID PANEL
Chol/HDL Ratio: 3.7 ratio units (ref 0.0–4.4)
Cholesterol, Total: 174 mg/dL (ref 100–199)
HDL: 47 mg/dL (ref >39)
LDL Calculated: 92 mg/dL (ref 0–99)
TRIGLYCERIDES: 174 mg/dL — AB (ref 0–149)
VLDL CHOLESTEROL CAL: 35 mg/dL (ref 5–40)

## 2016-03-29 LAB — COMPREHENSIVE METABOLIC PANEL
ALT: 42 IU/L — AB (ref 0–32)
AST: 25 IU/L (ref 0–40)
Albumin/Globulin Ratio: 1.7 (ref 1.2–2.2)
Albumin: 4.3 g/dL (ref 3.5–5.5)
Alkaline Phosphatase: 119 IU/L — ABNORMAL HIGH (ref 39–117)
BUN/Creatinine Ratio: 21 (ref 9–23)
BUN: 16 mg/dL (ref 6–24)
Bilirubin Total: 0.2 mg/dL (ref 0.0–1.2)
CALCIUM: 9.9 mg/dL (ref 8.7–10.2)
CO2: 24 mmol/L (ref 18–29)
CREATININE: 0.77 mg/dL (ref 0.57–1.00)
Chloride: 106 mmol/L (ref 96–106)
GFR calc Af Amer: 100 mL/min/{1.73_m2} (ref >59)
GFR, EST NON AFRICAN AMERICAN: 87 mL/min/{1.73_m2} (ref >59)
GLOBULIN, TOTAL: 2.5 g/dL (ref 1.5–4.5)
Glucose: 117 mg/dL — ABNORMAL HIGH (ref 65–99)
Potassium: 5.2 mmol/L (ref 3.5–5.2)
SODIUM: 149 mmol/L — AB (ref 134–144)
Total Protein: 6.8 g/dL (ref 6.0–8.5)

## 2016-03-29 LAB — VITAMIN D 25 HYDROXY (VIT D DEFICIENCY, FRACTURES): VIT D 25 HYDROXY: 29.6 ng/mL — AB (ref 30.0–100.0)

## 2016-03-29 LAB — TSH: TSH: 4.67 u[IU]/mL — ABNORMAL HIGH (ref 0.450–4.500)

## 2016-04-04 ENCOUNTER — Ambulatory Visit: Payer: BLUE CROSS/BLUE SHIELD | Admitting: Family Medicine

## 2016-04-04 ENCOUNTER — Telehealth: Payer: Self-pay

## 2016-04-04 NOTE — Telephone Encounter (Signed)
[email or web address]

## 2016-04-04 NOTE — Telephone Encounter (Signed)
Pt is returning call.  CB#[phone]/MW

## 2016-04-04 NOTE — Telephone Encounter (Signed)
-----   Message from Margarita Rana, MD sent at 04/01/2016  9:35 AM EDT ----- Labs stable. Vit D at borderline, think can improve with 10 to 15 minutes in the sun daily, will help her mental health also.  TSH borderline.  Recheck TSH and T4  After next follow up .   Cholesterol looks good.  Liver enzymes stable. Thanks.

## 2016-04-05 NOTE — Telephone Encounter (Signed)
[email or web address] 

## 2016-04-05 NOTE — Telephone Encounter (Signed)
Pt informed and voiced understanding of results. 

## 2016-04-06 ENCOUNTER — Ambulatory Visit (INDEPENDENT_AMBULATORY_CARE_PROVIDER_SITE_OTHER): Payer: BLUE CROSS/BLUE SHIELD | Admitting: Family Medicine

## 2016-04-06 ENCOUNTER — Encounter: Payer: Self-pay | Admitting: Family Medicine

## 2016-04-06 VITALS — BP 120/82 | HR 79 | Temp 98.1°F | Resp 16 | Wt 160.0 lb

## 2016-04-06 DIAGNOSIS — F329 Major depressive disorder, single episode, unspecified: Secondary | ICD-10-CM | POA: Diagnosis not present

## 2016-04-06 DIAGNOSIS — F32A Depression, unspecified: Secondary | ICD-10-CM

## 2016-04-06 DIAGNOSIS — J019 Acute sinusitis, unspecified: Secondary | ICD-10-CM | POA: Insufficient documentation

## 2016-04-06 DIAGNOSIS — J309 Allergic rhinitis, unspecified: Secondary | ICD-10-CM

## 2016-04-06 DIAGNOSIS — J0100 Acute maxillary sinusitis, unspecified: Secondary | ICD-10-CM

## 2016-04-06 MED ORDER — DOXYCYCLINE HYCLATE 100 MG PO TABS: 100.0000 mg | ORAL_TABLET | Freq: Two times a day (BID) | ORAL | Status: DC

## 2016-04-06 NOTE — Progress Notes (Signed)
Patient: Hailey Horton Female    DOB: 1961/09/26   55 y.o.   MRN: [id number] Visit Date: 04/06/2016  Today's Provider: Margarita Rana, MD   Chief Complaint  Patient presents with  . URI   Subjective:    URI  This is a new problem. The current episode started 1 to 4 weeks ago (8 days ago). The problem has been gradually worsening. There has been no fever. Associated symptoms include congestion (nasal congestion when laying down), coughing (not productive), ear pain (just not right. Comes and goes.  ), headaches (intermiittent. Frontal. ), nausea, a plugged ear sensation, rhinorrhea, sneezing and a sore throat (resolved). Pertinent negatives include no abdominal pain, chest pain, diarrhea, dysuria, joint pain, joint swelling, neck pain, sinus pain, swollen glands, vomiting or wheezing. Treatments tried: DayQuil and Benadryl OTC. The treatment provided no relief.   Depression is slightly better. Ddi not start Remeron because of concern of weight gain.  Has to drive her brother in law around.  Does feel she is doing better since she is not at work.   No panic. Is going to see how it goes.    Also reviewed labs. TSH is borderline.  Will recheck this summer. Not concerning at this time.     Allergies  Allergen Reactions  . Acetaminophen-Codeine   . Clarithromycin     GI upset  . Codeine Phosphate     REACTION: difficulty breathing; vomiting  . Hydrochlorothiazide   . Penicillins     REACTION: difficulty breathing; rash; vomiting  . Peanuts  [Peanut Oil] Swelling  . Sulfacetamide Sodium     REACTION: difficulty breathing, rash, vomiting  . Sulfa Antibiotics Rash   Previous Medications   ALBUTEROL (PROAIR HFA) 108 (90 BASE) MCG/ACT INHALER    Inhale 1 spray into the lungs as needed.   ALPRAZOLAM (XANAX) 0.5 MG TABLET    1/2 -1 tablet 2 times a day as needed.   ASPIRIN 81 MG TABLET    Take 1 tablet by mouth daily.   EPINEPHRINE (EPIPEN 2-PAK) 0.3 MG/0.3 ML IJ SOAJ INJECTION     EPIPEN 2-PAK, 0.3MG/0.3ML (Injection Solution Auto-injector)  1 Device as needed for 0 days  Quantity: 1;  Refills: 2   Ordered :14-Apr-2015  Margarita Rana MD;  Started 14-Apr-2015 Active Comments: Medication taken as needed.    ESCITALOPRAM (LEXAPRO) 20 MG TABLET    TAKE 1 TABLET BY MOUTH EVERY DAY   ESOMEPRAZOLE (NEXIUM) 40 MG CAPSULE    Take 1 capsule (40 mg total) by mouth daily.   FLUTICASONE (FLONASE) 50 MCG/ACT NASAL SPRAY    Place 2 sprays into both nostrils daily.   GLUCOSE BLOOD (ONE TOUCH ULTRA TEST) TEST STRIP    To check blood sugar once a day.  DX: E11.9   METFORMIN (GLUCOPHAGE) 500 MG TABLET    Take 500 mg by mouth 2 (two) times daily.   NYSTATIN OINTMENT (MYCOSTATIN)    Apply 1 application topically 2 (two) times daily. To affected area   QUINAPRIL (ACCUPRIL) 20 MG TABLET    Take 1 tablet (20 mg total) by mouth daily.   SIMVASTATIN (ZOCOR) 20 MG TABLET    TAKE 1 TABLET BY MOUTH AT BEDTIME   VITAMIN D, ERGOCALCIFEROL, (DRISDOL) 50000 UNITS CAPS CAPSULE    Take 1 capsule (50,000 Units total) by mouth every 30 (thirty) days.    Review of Systems  Constitutional: Negative for fever, chills, diaphoresis, activity change, appetite change and fatigue.  HENT: Positive for congestion (nasal congestion when laying down), ear pain (just not right. Comes and goes.  ), postnasal drip, rhinorrhea, sinus pressure, sneezing, sore throat (resolved), tinnitus and voice change. Negative for mouth sores and nosebleeds.        Blowing yellow/ green out of nose  Respiratory: Positive for cough (not productive). Negative for chest tightness, shortness of breath and wheezing.   Cardiovascular: Negative for chest pain and palpitations.  Gastrointestinal: Positive for nausea. Negative for vomiting, abdominal pain and diarrhea.  Genitourinary: Negative for dysuria.  Musculoskeletal: Negative for joint pain and neck pain.  Neurological: Positive for dizziness, light-headedness and headaches  (intermiittent. Frontal. ). Negative for weakness.    Social History  Substance Use Topics  . Smoking status: Never Smoker   . Smokeless tobacco: Not on file  . Alcohol Use: No   Objective:   BP 120/82 mmHg  Pulse 79  Temp(Src) 98.1 F (36.7 C) (Oral)  Resp 16  Wt 160 lb (72.576 kg)  SpO2 97%  Physical Exam  Constitutional: She is oriented to person, place, and time. She appears well-developed and well-nourished.  HENT:  Head: Normocephalic and atraumatic.  Right Ear: Tympanic membrane and external ear normal.  Left Ear: Tympanic membrane and external ear normal.  Nose: Mucosal edema and rhinorrhea present. Right sinus exhibits maxillary sinus tenderness. Left sinus exhibits maxillary sinus tenderness.  Mouth/Throat: Uvula is midline and oropharynx is clear and moist.  Eyes: Conjunctivae and EOM are normal. Pupils are equal, round, and reactive to light.  Neck: Normal range of motion. Neck supple.  Cardiovascular: Normal rate and regular rhythm.   Pulmonary/Chest: Effort normal and breath sounds normal. She has no wheezes. She has no rales.  Neurological: She is alert and oriented to person, place, and time.      Assessment & Plan:     1. Allergic rhinitis, unspecified allergic rhinitis type Continue current medication.   2. Clinical depression Improved some. Continue to monitor.  Did not take Mirtazapine. Discontinued on medication list.    3. Acute maxillary sinusitis, recurrence not specified New problem. Worsening. Will treat. Patient instructed to call back if condition worsens or does not improve.    - doxycycline (VIBRA-TABS) 100 MG tablet; Take 1 tablet (100 mg total) by mouth 2 (two) times daily.  Dispense: 20 tablet; Refill: 0      Margarita Rana, MD  [facility]

## 2016-04-14 ENCOUNTER — Encounter: Payer: BLUE CROSS/BLUE SHIELD | Admitting: Family Medicine

## 2016-04-18 ENCOUNTER — Other Ambulatory Visit: Payer: Self-pay

## 2016-04-18 DIAGNOSIS — E119 Type 2 diabetes mellitus without complications: Secondary | ICD-10-CM

## 2016-04-18 MED ORDER — METFORMIN HCL 500 MG PO TABS: 500.0000 mg | ORAL_TABLET | Freq: Two times a day (BID) | ORAL | Status: DC

## 2016-05-11 ENCOUNTER — Other Ambulatory Visit: Payer: Self-pay

## 2016-05-11 DIAGNOSIS — K219 Gastro-esophageal reflux disease without esophagitis: Secondary | ICD-10-CM

## 2016-05-11 DIAGNOSIS — F329 Major depressive disorder, single episode, unspecified: Secondary | ICD-10-CM

## 2016-05-11 DIAGNOSIS — F32A Depression, unspecified: Secondary | ICD-10-CM

## 2016-05-11 MED ORDER — ESCITALOPRAM OXALATE 20 MG PO TABS: 20.0000 mg | ORAL_TABLET | Freq: Every day | ORAL | Status: DC

## 2016-05-11 MED ORDER — ESOMEPRAZOLE MAGNESIUM 40 MG PO CPDR: 40.0000 mg | DELAYED_RELEASE_CAPSULE | Freq: Every day | ORAL | Status: DC

## 2016-05-25 ENCOUNTER — Other Ambulatory Visit: Payer: Self-pay | Admitting: Family Medicine

## 2016-05-25 DIAGNOSIS — F4312 Post-traumatic stress disorder, chronic: Secondary | ICD-10-CM | POA: Diagnosis not present

## 2016-05-25 DIAGNOSIS — E559 Vitamin D deficiency, unspecified: Secondary | ICD-10-CM

## 2016-06-01 ENCOUNTER — Ambulatory Visit: Payer: BLUE CROSS/BLUE SHIELD | Admitting: Family Medicine

## 2016-06-03 ENCOUNTER — Other Ambulatory Visit: Payer: Self-pay | Admitting: Family Medicine

## 2016-06-03 DIAGNOSIS — F419 Anxiety disorder, unspecified: Secondary | ICD-10-CM

## 2016-06-03 DIAGNOSIS — E559 Vitamin D deficiency, unspecified: Secondary | ICD-10-CM

## 2016-06-17 DIAGNOSIS — F4312 Post-traumatic stress disorder, chronic: Secondary | ICD-10-CM | POA: Diagnosis not present

## 2016-06-30 DIAGNOSIS — H524 Presbyopia: Secondary | ICD-10-CM | POA: Diagnosis not present

## 2016-07-11 ENCOUNTER — Other Ambulatory Visit: Payer: Self-pay | Admitting: Family Medicine

## 2016-07-11 DIAGNOSIS — E7800 Pure hypercholesterolemia, unspecified: Secondary | ICD-10-CM

## 2016-08-02 ENCOUNTER — Ambulatory Visit: Payer: BLUE CROSS/BLUE SHIELD | Admitting: Family Medicine

## 2016-08-26 ENCOUNTER — Ambulatory Visit: Payer: BLUE CROSS/BLUE SHIELD | Admitting: Family Medicine

## 2016-09-22 ENCOUNTER — Ambulatory Visit (INDEPENDENT_AMBULATORY_CARE_PROVIDER_SITE_OTHER): Payer: BLUE CROSS/BLUE SHIELD

## 2016-09-22 DIAGNOSIS — Z23 Encounter for immunization: Secondary | ICD-10-CM | POA: Diagnosis not present

## 2016-09-28 ENCOUNTER — Encounter: Payer: Self-pay | Admitting: Family Medicine

## 2016-09-28 ENCOUNTER — Ambulatory Visit (INDEPENDENT_AMBULATORY_CARE_PROVIDER_SITE_OTHER): Payer: BLUE CROSS/BLUE SHIELD | Admitting: Family Medicine

## 2016-09-28 VITALS — BP 130/74 | HR 84 | Temp 98.0°F | Resp 16 | Wt 162.0 lb

## 2016-09-28 DIAGNOSIS — F329 Major depressive disorder, single episode, unspecified: Secondary | ICD-10-CM

## 2016-09-28 DIAGNOSIS — F32A Depression, unspecified: Secondary | ICD-10-CM

## 2016-09-28 DIAGNOSIS — R7989 Other specified abnormal findings of blood chemistry: Secondary | ICD-10-CM

## 2016-09-28 DIAGNOSIS — I8393 Asymptomatic varicose veins of bilateral lower extremities: Secondary | ICD-10-CM | POA: Diagnosis not present

## 2016-09-28 DIAGNOSIS — F419 Anxiety disorder, unspecified: Secondary | ICD-10-CM

## 2016-09-28 DIAGNOSIS — E119 Type 2 diabetes mellitus without complications: Secondary | ICD-10-CM | POA: Diagnosis not present

## 2016-09-28 DIAGNOSIS — R946 Abnormal results of thyroid function studies: Secondary | ICD-10-CM

## 2016-09-28 DIAGNOSIS — R079 Chest pain, unspecified: Secondary | ICD-10-CM

## 2016-09-28 DIAGNOSIS — I10 Essential (primary) hypertension: Secondary | ICD-10-CM

## 2016-09-28 LAB — POCT GLYCOSYLATED HEMOGLOBIN (HGB A1C): Hemoglobin A1C: 7.2

## 2016-09-28 MED ORDER — GLUCOSE BLOOD VI STRP: ORAL_STRIP | 3 refills | Status: DC

## 2016-09-28 MED ORDER — EPINEPHRINE 0.3 MG/0.3ML IJ SOAJ: 0.3000 mg | Freq: Once | INTRAMUSCULAR | 0 refills | Status: AC

## 2016-09-28 MED ORDER — ALBUTEROL SULFATE HFA 108 (90 BASE) MCG/ACT IN AERS: 1.0000 | INHALATION_SPRAY | RESPIRATORY_TRACT | 12 refills | Status: DC | PRN

## 2016-09-28 MED ORDER — QUINAPRIL HCL 20 MG PO TABS: 20.0000 mg | ORAL_TABLET | Freq: Every day | ORAL | 5 refills | Status: DC

## 2016-09-28 NOTE — Progress Notes (Signed)
Subjective:  HPI  Diabetes Mellitus Type II, Follow-up:   Lab Results  Component Value Date   HGBA1C 6.7 03/28/2016   HGBA1C 7.8 09/18/2015   HGBA1C 6.4 (A) 01/23/2015    Last seen for diabetes 6 months ago.  Management since then includes none. She reports good compliance with treatment. She is not having side effects.  Home blood sugar records: 100-120  Episodes of hypoglycemia? no   Current Insulin Regimen: n/a Most Recent Eye Exam: 3-4 months ago Current diet: not asked Current exercise: none  Pertinent Labs:    Component Value Date/Time   CHOL 174 03/28/2016 1005   TRIG 174 (H) 03/28/2016 1005   HDL 47 03/28/2016 1005   LDLCALC 92 03/28/2016 1005   CREATININE 0.77 03/28/2016 1005    Wt Readings from Last 3 Encounters:  09/28/16 162 lb (73.5 kg)  04/06/16 160 lb (72.6 kg)  03/28/16 159 lb (72.1 kg)    ------------------------------------------------------------------------   Hypertension, follow-up:  BP Readings from Last 3 Encounters:  09/28/16 130/74  04/06/16 120/82  03/28/16 116/68     She was last seen for hypertension 6 months ago.  BP at that visit was 120/82. Management since that visit includes none. She reports good compliance with treatment. She is not having side effects.  She is not exercising. Outside blood pressures are occasionally checked. She is experiencing chest pain.  Patient denies chest pressure/discomfort, claudication, dyspnea, exertional chest pressure/discomfort, fatigue, irregular heart beat, lower extremity edema, near-syncope, orthopnea, palpitations, paroxysmal nocturnal dyspnea, syncope and tachypnea.   Cardiovascular risk factors include diabetes mellitus, dyslipidemia and hypertension.  Wt Readings from Last 3 Encounters:  09/28/16 162 lb (73.5 kg)  04/06/16 160 lb (72.6 kg)  03/28/16 159 lb (72.1 kg)   ------------------------------------------------------------------------   Pt also reports that she  had chest pain for 4 days about 1 month ago. She also had sweats, nausea and pain in her arm from the elbow down on her left arm. She also felt that she was going to pass out. Pt denies any shortness of breath. No further chest pain since this episode. " I was almost about to call 911 but I didn't, I might should have but I didn't.  This is the first time I've seen the patient but I am impressed that she has significant anxiety. Her description of her anxiety it is one of him pounding the attacks. 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 She denies that she has suicidal thoughts recently but says about the past that "everybody has those thoughts ."   Back in April pt had labs done and TSH was borderline, will need rechecked today.  Prior to Admission medications   Medication Sig Start Date End Date Taking? Authorizing Provider  albuterol (PROAIR HFA) 108 (90 BASE) MCG/ACT inhaler Inhale 1 spray into the lungs as needed. 04/14/15   Historical Provider, MD  ALPRAZolam Duanne Moron) 0.5 MG tablet TAKE 1/2 TO 1 TABLET TWICE A DAY AS NEEDED 06/03/16   Margarita Rana, MD  aspirin 81 MG tablet Take 1 tablet by mouth daily. 12/08/10   Historical Provider, MD  doxycycline (VIBRA-TABS) 100 MG tablet Take 1 tablet (100 mg total) by mouth 2 (two) times daily. 04/06/16   Margarita Rana, MD  EPINEPHrine (EPIPEN 2-PAK) 0.3 mg/0.3 mL IJ SOAJ injection EPIPEN 2-PAK, 0.3MG/0.3ML (Injection Solution Auto-injector)  1 Device as needed for 0 days  Quantity: 1;  Refills: 2   Ordered :14-Apr-2015  Margarita Rana MD;  Started 14-Apr-2015  Active Comments: Medication taken as needed.  04/14/15   Historical Provider, MD  escitalopram (LEXAPRO) 20 MG tablet Take 1 tablet (20 mg total) by mouth daily. 05/11/16   Margarita Rana, MD  esomeprazole (NEXIUM) 40 MG capsule Take 1 capsule (40 mg total) by mouth daily. 05/11/16   Margarita Rana, MD  fluticasone (FLONASE) 50 MCG/ACT nasal spray Place 2 sprays into both nostrils daily.  03/28/16   Margarita Rana, MD  glucose blood (ONE TOUCH ULTRA TEST) test strip To check blood sugar once a day.  DX: E11.9 10/07/15   Margarita Rana, MD  metFORMIN (GLUCOPHAGE) 500 MG tablet Take 1 tablet (500 mg total) by mouth 2 (two) times daily. 04/18/16   Margarita Rana, MD  nystatin ointment (MYCOSTATIN) Apply 1 application topically 2 (two) times daily. To affected area 09/18/15   Margarita Rana, MD  quinapril (ACCUPRIL) 20 MG tablet Take 1 tablet (20 mg total) by mouth daily. 03/28/16   Margarita Rana, MD  simvastatin (ZOCOR) 20 MG tablet TAKE 1 TABLET BY MOUTH AT BEDTIME 07/11/16   Richard Maceo Pro., MD  Vitamin D, Ergocalciferol, (DRISDOL) 50000 units CAPS capsule TAKE 1 CAPSULE BY MOUTH ONCE A MONTH 06/03/16   Margarita Rana, MD    Patient Active Problem List   Diagnosis Date Noted  . Sinusitis, acute 04/06/2016  . Adaptation reaction 08/28/2015  . Allergic to bees 08/28/2015  . Carpal tunnel syndrome 08/28/2015  . Chronic LBP 08/28/2015  . Coxsackie viral disease 08/28/2015  . Clinical depression 08/28/2015  . Diabetes ([place]) 08/28/2015  . DUB (dysfunctional uterine bleeding) 08/28/2015  . Endometriosis 08/28/2015  . Bleeding from the nose 08/28/2015  . Acid reflux 08/28/2015  . Gouty arthritis of toe 08/28/2015  . Enteroviral vesicular stomatitis with exanthem 08/28/2015  . Blood in the urine 08/28/2015  . Hypercholesteremia 08/28/2015  . BP (high blood pressure) 08/28/2015  . Leg paresthesia 08/28/2015  . NASH (nonalcoholic steatohepatitis) 08/28/2015  . Degenerative arthritis of toe joint 08/28/2015  . Osteopenia 08/28/2015  . Allergic to peanuts 08/28/2015  . Avitaminosis D 08/28/2015  . Phlebectasia 08/28/2015  . Bursitis, trochanteric 08/28/2015  . Hyperlipidemia 06/09/2008  . Anxiety 06/09/2008  . Depression 06/09/2008  . Essential hypertension 06/09/2008  . Allergic rhinitis 06/09/2008  . GERD 06/09/2008  . HEADACHE 06/09/2008  . COLONIC POLYPS, HX OF 06/09/2008     Past Medical History:  Diagnosis Date  . Allergy   . Anxiety   . Depression   . Diabetes mellitus without complication ([place])   . GERD (gastroesophageal reflux disease)   . Hyperlipidemia   . Hypertension     Social History   Social History  . Marital status: Married    Spouse name: N/A  . Number of children: N/A  . Years of education: N/A   Occupational History  . Not on file.   Social History Main Topics  . Smoking status: Never Smoker  . Smokeless tobacco: Never Used  . Alcohol use No  . Drug use: No  . Sexual activity: Not on file   Other Topics Concern  . Not on file   Social History Narrative  . No narrative on file    Allergies  Allergen Reactions  . Acetaminophen-Codeine   . Clarithromycin     GI upset  . Codeine Phosphate     REACTION: difficulty breathing; vomiting  . Hydrochlorothiazide   . Peanuts  [Peanut Oil] Swelling  . Penicillins     REACTION: difficulty breathing; rash; vomiting  .  Sulfacetamide Sodium     REACTION: difficulty breathing, rash, vomiting  . Sulfa Antibiotics Rash    Review of Systems  Constitutional: Negative.   HENT: Negative.   Eyes: Negative.   Respiratory: Negative.   Cardiovascular: Positive for chest pain.  Gastrointestinal: Positive for diarrhea.  Genitourinary: Negative.   Musculoskeletal: Negative.   Skin: Negative.   Neurological: Positive for tingling.  Endo/Heme/Allergies: Negative.   Psychiatric/Behavioral: Negative.     Immunization History  Administered Date(s) Administered  . Hepatitis A 01/29/2010, 08/02/2010  . Hepatitis B 01/29/2010, 02/26/2010, 08/02/2010  . Influenza,inj,Quad PF,36+ Mos 09/18/2015, 09/22/2016  . Pneumococcal Conjugate-13 12/09/2011  . Tdap 12/08/2010   Objective:  BP 130/74 (BP Location: Left Arm, Patient Position: Sitting, Cuff Size: Normal)   Pulse 84   Temp 98 F (36.7 C) (Oral)   Resp 16   Wt 162 lb (73.5 kg)   BMI 27.81 kg/m   Physical Exam   Constitutional: She is oriented to person, place, and time and well-developed, well-nourished, and in no distress.  HENT:  Head: Normocephalic and atraumatic.  Right Ear: External ear normal.  Left Ear: External ear normal.  Nose: Nose normal.  Eyes: Conjunctivae and EOM are normal. Pupils are equal, round, and reactive to light.  Neck: Normal range of motion. Neck supple.  Cardiovascular: Normal rate, regular rhythm, normal heart sounds and intact distal pulses.   Pulmonary/Chest: Effort normal and breath sounds normal.  Abdominal: Soft.  Musculoskeletal: Normal range of motion.  Neurological: She is alert and oriented to person, place, and time. She has normal reflexes. Gait normal. GCS score is 15.  Skin: Skin is warm and dry.  Psychiatric: Mood, memory and affect normal.    Lab Results  Component Value Date   WBC 7.8 03/28/2016   HGB 13.2 03/04/2015   HCT 41.1 03/28/2016   PLT 326 03/28/2016   GLUCOSE 117 (H) 03/28/2016   CHOL 174 03/28/2016   TRIG 174 (H) 03/28/2016   HDL 47 03/28/2016   LDLCALC 92 03/28/2016   TSH 4.670 (H) 03/28/2016   HGBA1C 6.7 03/28/2016   MICROALBUR 20 03/28/2016    CMP     Component Value Date/Time   NA 149 (H) 03/28/2016 1005   K 5.2 03/28/2016 1005   CL 106 03/28/2016 1005   CO2 24 03/28/2016 1005   GLUCOSE 117 (H) 03/28/2016 1005   BUN 16 03/28/2016 1005   CREATININE 0.77 03/28/2016 1005   CALCIUM 9.9 03/28/2016 1005   PROT 6.8 03/28/2016 1005   ALBUMIN 4.3 03/28/2016 1005   AST 25 03/28/2016 1005   ALT 42 (H) 03/28/2016 1005   ALKPHOS 119 (H) 03/28/2016 1005   BILITOT 0.2 03/28/2016 1005   GFRNONAA 87 03/28/2016 1005   GFRAA 100 03/28/2016 1005    Assessment and Plan :  1. Type 2 diabetes mellitus without complication, without long-term current use of insulin (HCC)  - POCT HgB A1C 7.2 today. Stable.  - glucose blood (ONE TOUCH ULTRA TEST) test strip; To check blood sugar once a day.  DX: E11.9  Dispense: 100 each; Refill:  3  2. Essential hypertension  - quinapril (ACCUPRIL) 20 MG tablet; Take 1 tablet (20 mg total) by mouth daily.  Dispense: 30 tablet; Refill: 5  3. Depression, unspecified depression type Patient says that she is willing to see psychiatry. I think this is very important. Think her psychiatric illnesses affect her day-to-day quality of life - Ambulatory referral to Psychiatry  4. Anxiety/Possible panic attacks/possible Agoraphobia Refer  to psychiatry if patient will keep appointment  5. Chest pain, unspecified type  - EKG 12-Lead  6. Varicose veins of both lower extremities No pain.  7. Abnormal TSH  - TSH   HPI, Exam, and A&P Transcribed under the direction and in the presence of Richard L. Cranford Mon, MD  Electronically Signed: Webb Laws, [place] MD [facility] 09/28/2016 3:41 PM

## 2016-09-30 DIAGNOSIS — R946 Abnormal results of thyroid function studies: Secondary | ICD-10-CM | POA: Diagnosis not present

## 2016-10-01 LAB — TSH: TSH: 1.64 u[IU]/mL (ref 0.450–4.500)

## 2016-11-17 ENCOUNTER — Other Ambulatory Visit: Payer: Self-pay | Admitting: Family Medicine

## 2016-11-17 DIAGNOSIS — F329 Major depressive disorder, single episode, unspecified: Secondary | ICD-10-CM

## 2016-11-17 DIAGNOSIS — F32A Depression, unspecified: Secondary | ICD-10-CM

## 2016-11-28 ENCOUNTER — Ambulatory Visit (INDEPENDENT_AMBULATORY_CARE_PROVIDER_SITE_OTHER): Payer: BLUE CROSS/BLUE SHIELD | Admitting: Family Medicine

## 2016-11-28 ENCOUNTER — Encounter (INDEPENDENT_AMBULATORY_CARE_PROVIDER_SITE_OTHER): Payer: Self-pay

## 2016-11-28 VITALS — BP 108/76 | HR 78 | Temp 98.3°F | Resp 16 | Wt 163.0 lb

## 2016-11-28 DIAGNOSIS — K7581 Nonalcoholic steatohepatitis (NASH): Secondary | ICD-10-CM | POA: Diagnosis not present

## 2016-11-28 DIAGNOSIS — F3340 Major depressive disorder, recurrent, in remission, unspecified: Secondary | ICD-10-CM

## 2016-11-28 DIAGNOSIS — K219 Gastro-esophageal reflux disease without esophagitis: Secondary | ICD-10-CM

## 2016-11-28 DIAGNOSIS — M5137 Other intervertebral disc degeneration, lumbosacral region: Secondary | ICD-10-CM

## 2016-11-28 DIAGNOSIS — M5030 Other cervical disc degeneration, unspecified cervical region: Secondary | ICD-10-CM | POA: Diagnosis not present

## 2016-11-28 MED ORDER — NAPROXEN 500 MG PO TABS: 500.0000 mg | ORAL_TABLET | Freq: Two times a day (BID) | ORAL | 5 refills | Status: DC | PRN

## 2016-11-28 NOTE — Progress Notes (Signed)
Hailey Horton  MRN: [id number] DOB: 1961/05/11  Subjective:  HPI  Patient states she has had headaches off and on for 3 months. No pattern to when headaches come on. Pain is described as sharp and at times dull achy. Pain located on the left side of her head and in the back of the neck. Some lightheadedness. No visual changes. No chest pain or SOB. She states this pain reminds her of when she had issues with spine inflammation in the past. She has taking Aleve and Goodies but pain did not get better. Patient Active Problem List   Diagnosis Date Noted  . Sinusitis, acute 04/06/2016  . Adaptation reaction 08/28/2015  . Allergic to bees 08/28/2015  . Carpal tunnel syndrome 08/28/2015  . Chronic LBP 08/28/2015  . Coxsackie viral disease 08/28/2015  . Clinical depression 08/28/2015  . Diabetes ([place]) 08/28/2015  . DUB (dysfunctional uterine bleeding) 08/28/2015  . Endometriosis 08/28/2015  . Bleeding from the nose 08/28/2015  . Acid reflux 08/28/2015  . Gouty arthritis of toe 08/28/2015  . Enteroviral vesicular stomatitis with exanthem 08/28/2015  . Blood in the urine 08/28/2015  . Hypercholesteremia 08/28/2015  . BP (high blood pressure) 08/28/2015  . Leg paresthesia 08/28/2015  . NASH (nonalcoholic steatohepatitis) 08/28/2015  . Degenerative arthritis of toe joint 08/28/2015  . Osteopenia 08/28/2015  . Allergic to peanuts 08/28/2015  . Avitaminosis D 08/28/2015  . Phlebectasia 08/28/2015  . Bursitis, trochanteric 08/28/2015  . Hyperlipidemia 06/09/2008  . Anxiety 06/09/2008  . Depression 06/09/2008  . Essential hypertension 06/09/2008  . Allergic rhinitis 06/09/2008  . GERD 06/09/2008  . HEADACHE 06/09/2008  . COLONIC POLYPS, HX OF 06/09/2008    Past Medical History:  Diagnosis Date  . Allergy   . Anxiety   . Depression   . Diabetes mellitus without complication ([place])   . GERD (gastroesophageal reflux disease)   . Hyperlipidemia   . Hypertension     Social  History   Social History  . Marital status: Married    Spouse name: N/A  . Number of children: N/A  . Years of education: N/A   Occupational History  . Not on file.   Social History Main Topics  . Smoking status: Never Smoker  . Smokeless tobacco: Never Used  . Alcohol use No  . Drug use: No  . Sexual activity: Not on file   Other Topics Concern  . Not on file   Social History Narrative  . No narrative on file    Outpatient Encounter Prescriptions as of 11/28/2016  Medication Sig Note  . albuterol (PROAIR HFA) 108 (90 Base) MCG/ACT inhaler Inhale 1 puff into the lungs as needed.   . ALPRAZolam (XANAX) 0.5 MG tablet TAKE 1/2 TO 1 TABLET TWICE A DAY AS NEEDED   . aspirin 81 MG tablet Take 1 tablet by mouth daily. 09/17/2015: Received from: [place]: Take by mouth.  . escitalopram (LEXAPRO) 20 MG tablet TAKE 1 TABLET (20 MG TOTAL) BY MOUTH DAILY.   Marland Kitchen esomeprazole (NEXIUM) 40 MG capsule Take 1 capsule (40 mg total) by mouth daily.   . fluticasone (FLONASE) 50 MCG/ACT nasal spray Place 2 sprays into both nostrils daily.   Marland Kitchen glucose blood (ONE TOUCH ULTRA TEST) test strip To check blood sugar once a day.  DX: E11.9   . metFORMIN (GLUCOPHAGE) 500 MG tablet Take 1 tablet (500 mg total) by mouth 2 (two) times daily.   Marland Kitchen nystatin ointment (MYCOSTATIN) Apply 1 application  topically 2 (two) times daily. To affected area   . quinapril (ACCUPRIL) 20 MG tablet Take 1 tablet (20 mg total) by mouth daily.   . simvastatin (ZOCOR) 20 MG tablet TAKE 1 TABLET BY MOUTH AT BEDTIME   . Vitamin D, Ergocalciferol, (DRISDOL) 50000 units CAPS capsule TAKE 1 CAPSULE BY MOUTH ONCE A MONTH    No facility-administered encounter medications on file as of 11/28/2016.     Allergies  Allergen Reactions  . Acetaminophen-Codeine   . Clarithromycin     GI upset  . Codeine Phosphate     REACTION: difficulty breathing; vomiting  . Hydrochlorothiazide   . Peanuts  [Peanut Oil]  Swelling  . Penicillins     REACTION: difficulty breathing; rash; vomiting  . Sulfacetamide Sodium     REACTION: difficulty breathing, rash, vomiting  . Sulfa Antibiotics Rash    Review of Systems  Constitutional: Negative.   Eyes: Negative.   Respiratory: Negative.   Cardiovascular: Negative.   Musculoskeletal: Positive for back pain and joint pain.  Neurological: Positive for dizziness, tingling (right hand) and headaches.    Objective:  BP 108/76   Pulse 78   Temp 98.3 F (36.8 C)   Resp 16   Wt 163 lb (73.9 kg)   BMI 27.98 kg/m   Physical Exam  Constitutional: She is oriented to person, place, and time and well-developed, well-nourished, and in no distress.  HENT:  Head: Normocephalic and atraumatic.  Eyes: Conjunctivae are normal. No scleral icterus.  Neck: No thyromegaly present.  Cardiovascular: Normal rate, regular rhythm and normal heart sounds.   Pulmonary/Chest: Effort normal and breath sounds normal.  Abdominal: Soft.  Neurological: She is alert and oriented to person, place, and time. Gait normal.  Skin: Skin is warm and dry.  Psychiatric: Mood, memory, affect and judgment normal.    Assessment and Plan :  Bipolar depression Patient did not keep appointment with psychiatry that are made for her. We will follow this for now. Cervical and lumbosacral degenerative disc disease Hypertension Hyperlipidemia GERD Headache Almost certainly secondary to cervical DDD, it is in the right posterior occipital region.  I have done the exam and reviewed the chart and it is accurate to the best of my knowledge. Development worker, community has been used and  any errors in dictation or transcription are unintentional. Miguel Aschoff M.D. [facility]   I have done the exam and reviewed the chart and it is accurate to the best of my knowledge. Development worker, community has been used and  any errors in dictation or transcription are  unintentional. Miguel Aschoff M.D. [facility]

## 2016-12-14 ENCOUNTER — Other Ambulatory Visit: Payer: Self-pay | Admitting: Family Medicine

## 2016-12-14 DIAGNOSIS — K219 Gastro-esophageal reflux disease without esophagitis: Secondary | ICD-10-CM

## 2017-01-10 ENCOUNTER — Ambulatory Visit (INDEPENDENT_AMBULATORY_CARE_PROVIDER_SITE_OTHER): Payer: BLUE CROSS/BLUE SHIELD | Admitting: Family Medicine

## 2017-01-10 VITALS — BP 142/74 | HR 76 | Temp 97.5°F | Resp 14 | Wt 165.0 lb

## 2017-01-10 DIAGNOSIS — K219 Gastro-esophageal reflux disease without esophagitis: Secondary | ICD-10-CM

## 2017-01-10 DIAGNOSIS — G4489 Other headache syndrome: Secondary | ICD-10-CM | POA: Diagnosis not present

## 2017-01-10 DIAGNOSIS — F3340 Major depressive disorder, recurrent, in remission, unspecified: Secondary | ICD-10-CM | POA: Diagnosis not present

## 2017-01-10 DIAGNOSIS — M5030 Other cervical disc degeneration, unspecified cervical region: Secondary | ICD-10-CM

## 2017-01-10 DIAGNOSIS — E784 Other hyperlipidemia: Secondary | ICD-10-CM

## 2017-01-10 DIAGNOSIS — R739 Hyperglycemia, unspecified: Secondary | ICD-10-CM | POA: Diagnosis not present

## 2017-01-10 DIAGNOSIS — I10 Essential (primary) hypertension: Secondary | ICD-10-CM | POA: Diagnosis not present

## 2017-01-10 DIAGNOSIS — E7849 Other hyperlipidemia: Secondary | ICD-10-CM

## 2017-01-10 NOTE — Progress Notes (Signed)
Hailey Horton  MRN: [id number] DOB: 05/14/1961  Subjective:  HPI  Patient is here for follow up from last visit on 11/28/16 for headache. It was noted on that visit that is was most likely due to cervical DDD. Started naproxen. Headaches are better, less frequent. Takes naproxen about 4 days a week. Neck soreness present where DDD is, some back stiffness and chronic back pain.  Patient Active Problem List   Diagnosis Date Noted  . Sinusitis, acute 04/06/2016  . Adaptation reaction 08/28/2015  . Allergic to bees 08/28/2015  . Carpal tunnel syndrome 08/28/2015  . Chronic LBP 08/28/2015  . Coxsackie viral disease 08/28/2015  . Clinical depression 08/28/2015  . Diabetes ([place]) 08/28/2015  . DUB (dysfunctional uterine bleeding) 08/28/2015  . Endometriosis 08/28/2015  . Bleeding from the nose 08/28/2015  . Acid reflux 08/28/2015  . Gouty arthritis of toe 08/28/2015  . Enteroviral vesicular stomatitis with exanthem 08/28/2015  . Blood in the urine 08/28/2015  . Hypercholesteremia 08/28/2015  . BP (high blood pressure) 08/28/2015  . Leg paresthesia 08/28/2015  . NASH (nonalcoholic steatohepatitis) 08/28/2015  . Degenerative arthritis of toe joint 08/28/2015  . Osteopenia 08/28/2015  . Allergic to peanuts 08/28/2015  . Avitaminosis D 08/28/2015  . Phlebectasia 08/28/2015  . Bursitis, trochanteric 08/28/2015  . Hyperlipidemia 06/09/2008  . Anxiety 06/09/2008  . Depression 06/09/2008  . Essential hypertension 06/09/2008  . Allergic rhinitis 06/09/2008  . GERD 06/09/2008  . HEADACHE 06/09/2008  . COLONIC POLYPS, HX OF 06/09/2008    Past Medical History:  Diagnosis Date  . Allergy   . Anxiety   . Depression   . Diabetes mellitus without complication ([place])   . GERD (gastroesophageal reflux disease)   . Hyperlipidemia   . Hypertension     Social History   Social History  . Marital status: Married    Spouse name: N/A  . Number of children: N/A  . Years of  education: N/A   Occupational History  . Not on file.   Social History Main Topics  . Smoking status: Never Smoker  . Smokeless tobacco: Never Used  . Alcohol use No  . Drug use: No  . Sexual activity: Not on file   Other Topics Concern  . Not on file   Social History Narrative  . No narrative on file    Outpatient Encounter Prescriptions as of 01/10/2017  Medication Sig Note  . albuterol (PROAIR HFA) 108 (90 Base) MCG/ACT inhaler Inhale 1 puff into the lungs as needed.   . ALPRAZolam (XANAX) 0.5 MG tablet TAKE 1/2 TO 1 TABLET TWICE A DAY AS NEEDED   . aspirin 81 MG tablet Take 1 tablet by mouth daily. 09/17/2015: Received from: [place]: Take by mouth.  . EPINEPHrine 0.3 mg/0.3 mL IJ SOAJ injection Inject into the muscle once. 01/10/2017: prn  . escitalopram (LEXAPRO) 20 MG tablet TAKE 1 TABLET (20 MG TOTAL) BY MOUTH DAILY.   Marland Kitchen esomeprazole (NEXIUM) 40 MG capsule TAKE 1 CAPSULE (40 MG TOTAL) BY MOUTH DAILY.   . fluticasone (FLONASE) 50 MCG/ACT nasal spray Place 2 sprays into both nostrils daily.   Marland Kitchen glucose blood (ONE TOUCH ULTRA TEST) test strip To check blood sugar once a day.  DX: E11.9   . metFORMIN (GLUCOPHAGE) 500 MG tablet Take 1 tablet (500 mg total) by mouth 2 (two) times daily.   . naproxen (NAPROSYN) 500 MG tablet Take 1 tablet (500 mg total) by mouth 2 (two) times daily as  needed.   . nystatin ointment (MYCOSTATIN) Apply 1 application topically 2 (two) times daily. To affected area   . quinapril (ACCUPRIL) 20 MG tablet Take 1 tablet (20 mg total) by mouth daily.   . simvastatin (ZOCOR) 20 MG tablet TAKE 1 TABLET BY MOUTH AT BEDTIME   . Vitamin D, Ergocalciferol, (DRISDOL) 50000 units CAPS capsule TAKE 1 CAPSULE BY MOUTH ONCE A MONTH    No facility-administered encounter medications on file as of 01/10/2017.     Allergies  Allergen Reactions  . Acetaminophen-Codeine   . Clarithromycin     GI upset  . Codeine Phosphate      REACTION: difficulty breathing; vomiting  . Hydrochlorothiazide   . Peanuts  [Peanut Oil] Swelling  . Penicillins     REACTION: difficulty breathing; rash; vomiting  . Sulfacetamide Sodium     REACTION: difficulty breathing, rash, vomiting  . Sulfa Antibiotics Rash    Review of Systems  Constitutional: Negative.   Respiratory: Negative.   Cardiovascular: Negative.   Musculoskeletal: Positive for back pain.       Back stiffness, neck soreness  Neurological: Positive for headaches.  Psychiatric/Behavioral: Positive for depression. The patient is nervous/anxious.        Stable on medication.    Objective:  BP (!) 142/74   Pulse 76   Temp 97.5 F (36.4 C)   Resp 14   Wt 165 lb (74.8 kg)   BMI 28.32 kg/m   Physical Exam  Constitutional: She is oriented to person, place, and time and well-developed, well-nourished, and in no distress.  HENT:  Head: Normocephalic and atraumatic.  Eyes: Conjunctivae are normal. Pupils are equal, round, and reactive to light.  Cardiovascular: Normal rate, regular rhythm, normal heart sounds and intact distal pulses.   No murmur heard. Pulmonary/Chest: Effort normal and breath sounds normal. No respiratory distress. She has no wheezes.  Neurological: She is alert and oriented to person, place, and time.  Psychiatric: Mood, memory, affect and judgment normal.    Assessment and Plan :  1. Other headache syndrome Better. Follow. May need further work up.  2. Gastroesophageal reflux disease without esophagitis Stable. - TSH  3. Recurrent major depressive disorder, in remission [facility]) Discussed with patient going to psychiatrist. Patient declined at this time. I think this would be very beneficial for patient. - TSH Most likely bipolar, pt  declined psychiatric referral 4. Cervical DDD  4. DDD (degenerative disc disease), cervical  5. Essential hypertension - CBC w/Diff/Platelet - Comprehensive metabolic panel  6. Other hyperlipidemia -  Comprehensive metabolic panel - Lipid Panel With LDL/HDL Ratio  7. Hyperglycemia - HgB A1c  HPI, Exam and A&P transcribed under direction and in the presence of Miguel Aschoff, MD. I have done the exam and reviewed the chart and it is accurate to the best of my knowledge. Development worker, community has been used and  any errors in dictation or transcription are unintentional. Miguel Aschoff M.D. [facility]

## 2017-01-11 LAB — CBC WITH DIFFERENTIAL/PLATELET
BASOS ABS: 0 10*3/uL (ref 0.0–0.2)
BASOS: 1 %
EOS (ABSOLUTE): 0.1 10*3/uL (ref 0.0–0.4)
Eos: 2 %
Hematocrit: 39.7 % (ref 34.0–46.6)
Hemoglobin: 13 g/dL (ref 11.1–15.9)
IMMATURE GRANULOCYTES: 0 %
Immature Grans (Abs): 0 10*3/uL (ref 0.0–0.1)
Lymphocytes Absolute: 1.6 10*3/uL (ref 0.7–3.1)
Lymphs: 25 %
MCH: 28.6 pg (ref 26.6–33.0)
MCHC: 32.7 g/dL (ref 31.5–35.7)
MCV: 87 fL (ref 79–97)
MONOS ABS: 0.3 10*3/uL (ref 0.1–0.9)
Monocytes: 5 %
NEUTROS PCT: 67 %
Neutrophils Absolute: 4.1 10*3/uL (ref 1.4–7.0)
PLATELETS: 333 10*3/uL (ref 150–379)
RBC: 4.55 x10E6/uL (ref 3.77–5.28)
RDW: 14.1 % (ref 12.3–15.4)
WBC: 6.1 10*3/uL (ref 3.4–10.8)

## 2017-01-11 LAB — COMPREHENSIVE METABOLIC PANEL
A/G RATIO: 1.6 (ref 1.2–2.2)
ALT: 65 IU/L — ABNORMAL HIGH (ref 0–32)
AST: 47 IU/L — AB (ref 0–40)
Albumin: 4.2 g/dL (ref 3.5–5.5)
Alkaline Phosphatase: 103 IU/L (ref 39–117)
BILIRUBIN TOTAL: 0.3 mg/dL (ref 0.0–1.2)
BUN/Creatinine Ratio: 23 (ref 9–23)
BUN: 18 mg/dL (ref 6–24)
CALCIUM: 9.6 mg/dL (ref 8.7–10.2)
CHLORIDE: 101 mmol/L (ref 96–106)
CO2: 23 mmol/L (ref 18–29)
Creatinine, Ser: 0.77 mg/dL (ref 0.57–1.00)
GFR calc Af Amer: 99 mL/min/{1.73_m2} (ref >59)
GFR, EST NON AFRICAN AMERICAN: 86 mL/min/{1.73_m2} (ref >59)
Globulin, Total: 2.7 g/dL (ref 1.5–4.5)
Glucose: 195 mg/dL — ABNORMAL HIGH (ref 65–99)
POTASSIUM: 4.4 mmol/L (ref 3.5–5.2)
Sodium: 140 mmol/L (ref 134–144)
Total Protein: 6.9 g/dL (ref 6.0–8.5)

## 2017-01-11 LAB — HEMOGLOBIN A1C
ESTIMATED AVERAGE GLUCOSE: 163 mg/dL
Hgb A1c MFr Bld: 7.3 % — ABNORMAL HIGH (ref 4.8–5.6)

## 2017-01-11 LAB — LIPID PANEL WITH LDL/HDL RATIO
CHOLESTEROL TOTAL: 191 mg/dL (ref 100–199)
HDL: 45 mg/dL (ref >39)
LDL Calculated: 108 mg/dL — ABNORMAL HIGH (ref 0–99)
LDl/HDL Ratio: 2.4 ratio units (ref 0.0–3.2)
Triglycerides: 191 mg/dL — ABNORMAL HIGH (ref 0–149)
VLDL Cholesterol Cal: 38 mg/dL (ref 5–40)

## 2017-01-11 LAB — TSH: TSH: 1.68 u[IU]/mL (ref 0.450–4.500)

## 2017-01-16 ENCOUNTER — Telehealth: Payer: Self-pay | Admitting: Family Medicine

## 2017-01-16 ENCOUNTER — Telehealth: Payer: Self-pay

## 2017-01-16 ENCOUNTER — Other Ambulatory Visit: Payer: Self-pay | Admitting: Family Medicine

## 2017-01-16 DIAGNOSIS — E119 Type 2 diabetes mellitus without complications: Secondary | ICD-10-CM

## 2017-01-16 NOTE — Telephone Encounter (Signed)
-----   Message from Jerrol Banana., MD sent at 01/16/2017  3:47 PM EST ----- Mild fatty liver and cholesterol mildly elevated. Work on diet and exercise. Diabetes stable.

## 2017-01-16 NOTE — Telephone Encounter (Signed)
lmtcb Der Gagliano Drozdowski, CMA  

## 2017-01-16 NOTE — Telephone Encounter (Signed)
t is requesting lab results.  CB# [phone]

## 2017-01-16 NOTE — Telephone Encounter (Signed)
Please review labs. Hailey Horton, CMA

## 2017-01-16 NOTE — Telephone Encounter (Signed)
lmtcb Jaxyn Rout Drozdowski, CMA  

## 2017-01-16 NOTE — Telephone Encounter (Signed)
Please review-aa 

## 2017-01-17 NOTE — Telephone Encounter (Signed)
Advised pt of lab results. Pt verbally acknowledges understanding. Emily Drozdowski, CMA   

## 2017-01-18 ENCOUNTER — Other Ambulatory Visit: Payer: Self-pay | Admitting: Family Medicine

## 2017-02-10 ENCOUNTER — Telehealth: Payer: Self-pay | Admitting: Family Medicine

## 2017-02-10 DIAGNOSIS — F419 Anxiety disorder, unspecified: Secondary | ICD-10-CM

## 2017-02-10 MED ORDER — ALPRAZOLAM 0.5 MG PO TABS: ORAL_TABLET | ORAL | 5 refills | Status: DC

## 2017-02-10 NOTE — Telephone Encounter (Signed)
rx called in-aa 

## 2017-02-10 NOTE — Telephone Encounter (Signed)
Pt needs refill on her  ALPRAZolam Duanne Moron) 0.5 MG tablet  Taking 06/03/16 -- Margarita Rana, MD    TAKE 1/2 TO 1 TABLET TWICE A DAY AS NEEDED   She uses [facility] [address]  Thanks teri

## 2017-02-10 NOTE — Telephone Encounter (Signed)
Please review-aa 

## 2017-02-10 NOTE — Telephone Encounter (Signed)
[place] with 5 rf

## 2017-02-15 ENCOUNTER — Encounter: Payer: BLUE CROSS/BLUE SHIELD | Admitting: Physician Assistant

## 2017-04-05 ENCOUNTER — Other Ambulatory Visit: Payer: Self-pay | Admitting: Family Medicine

## 2017-04-05 DIAGNOSIS — I10 Essential (primary) hypertension: Secondary | ICD-10-CM

## 2017-04-05 MED ORDER — QUINAPRIL HCL 20 MG PO TABS: 20.0000 mg | ORAL_TABLET | Freq: Every day | ORAL | 3 refills | Status: DC

## 2017-04-05 NOTE — Telephone Encounter (Signed)
Done-aa

## 2017-04-05 NOTE — Telephone Encounter (Signed)
[facility]ed [facility] request for a 90-day supply for following medication. Thanks CC   quinapril (ACCUPRIL) 20 MG tablet  Take 1 tablet by mouth daily.

## 2017-05-10 ENCOUNTER — Encounter: Payer: Self-pay | Admitting: Family Medicine

## 2017-05-10 ENCOUNTER — Ambulatory Visit (INDEPENDENT_AMBULATORY_CARE_PROVIDER_SITE_OTHER): Payer: BLUE CROSS/BLUE SHIELD | Admitting: Family Medicine

## 2017-05-10 VITALS — BP 120/72 | HR 80 | Temp 98.2°F | Wt 164.0 lb

## 2017-05-10 DIAGNOSIS — M545 Low back pain: Secondary | ICD-10-CM

## 2017-05-10 DIAGNOSIS — I10 Essential (primary) hypertension: Secondary | ICD-10-CM

## 2017-05-10 DIAGNOSIS — E559 Vitamin D deficiency, unspecified: Secondary | ICD-10-CM

## 2017-05-10 DIAGNOSIS — G8929 Other chronic pain: Secondary | ICD-10-CM

## 2017-05-10 DIAGNOSIS — F32A Depression, unspecified: Secondary | ICD-10-CM

## 2017-05-10 DIAGNOSIS — F329 Major depressive disorder, single episode, unspecified: Secondary | ICD-10-CM | POA: Diagnosis not present

## 2017-05-10 DIAGNOSIS — E119 Type 2 diabetes mellitus without complications: Secondary | ICD-10-CM | POA: Diagnosis not present

## 2017-05-10 LAB — POCT GLYCOSYLATED HEMOGLOBIN (HGB A1C)
ESTIMATED AVERAGE GLUCOSE: 171
HEMOGLOBIN A1C: 7.6

## 2017-05-10 MED ORDER — METFORMIN HCL 1000 MG PO TABS: 1000.0000 mg | ORAL_TABLET | Freq: Two times a day (BID) | ORAL | 5 refills | Status: DC

## 2017-05-10 MED ORDER — GLUCOSE BLOOD VI STRP: ORAL_STRIP | 3 refills | Status: DC

## 2017-05-10 MED ORDER — ESCITALOPRAM OXALATE 20 MG PO TABS: 20.0000 mg | ORAL_TABLET | Freq: Every day | ORAL | 1 refills | Status: DC

## 2017-05-10 MED ORDER — VITAMIN D (ERGOCALCIFEROL) 1.25 MG (50000 UNIT) PO CAPS: 50000.0000 [IU] | ORAL_CAPSULE | ORAL | 0 refills | Status: DC

## 2017-05-10 NOTE — Progress Notes (Signed)
Patient: Hailey Horton Female    DOB: [birth year]   56 y.o.   MRN: [id number] Visit Date: 05/10/2017  Today's Provider: Wilhemena Durie, MD   Chief Complaint  Patient presents with  . Diabetes  . Hypertension  . Depression   Subjective:    HPI  Diabetes Mellitus Type II, Follow-up:   Lab Results  Component Value Date   HGBA1C 7.6 05/10/2017   HGBA1C 7.3 (H) 01/10/2017   HGBA1C 7.2 09/28/2016    Last seen for diabetes 4 months ago.  Management since then includes no changes. She reports good compliance with treatment. She is not having side effects.  Current symptoms include none and have been stable. Home blood sugar records: trend: fluctuating a bit  Episodes of hypoglycemia? no  Most Recent Eye Exam: due Weight trend: stable Prior visit with dietician: no Current diet: well balanced Current exercise: walking  Pertinent Labs:    Component Value Date/Time   CHOL 191 01/10/2017 1151   TRIG 191 (H) 01/10/2017 1151   HDL 45 01/10/2017 1151   LDLCALC 108 (H) 01/10/2017 1151   CREATININE 0.77 01/10/2017 1151    Wt Readings from Last 3 Encounters:  05/10/17 164 lb (74.4 kg)  01/10/17 165 lb (74.8 kg)  11/28/16 163 lb (73.9 kg)    Hypertension, follow-up:  BP Readings from Last 3 Encounters:  05/10/17 120/72  01/10/17 (!) 142/74  11/28/16 108/76    She was last seen for hypertension 4 months ago.  BP at that visit was 142/74. Management since that visit includes no changes. She reports good compliance with treatment. She is not having side effects.  She is exercising. She is adherent to low salt diet.   Outside blood pressures are checked occasionally. Patient denies exertional chest pressure/discomfort, lower extremity edema and palpitations.   Cardiovascular risk factors include diabetes mellitus and dyslipidemia.     Weight trend: stable Wt Readings from Last 3 Encounters:  05/10/17 164 lb (74.4 kg)  01/10/17 165 lb (74.8 kg)    11/28/16 163 lb (73.9 kg)    Current diet: well balanced   Depression, follow up: Patient was seen in the office 4 months ago. No changes were made in her medications. However, patient was referred to psychiatry.  Depression screen [facility] 2/9 05/10/2017  Decreased Interest 0  Down, Depressed, Hopeless 1  PHQ - 2 Score 1  Altered sleeping 2  Tired, decreased energy 1  Change in appetite 0  Feeling bad or failure about yourself  1  Trouble concentrating 0  Moving slowly or fidgety/restless 0  Suicidal thoughts (No Data)  PHQ-9 Score 5       Allergies  Allergen Reactions  . Acetaminophen-Codeine   . Clarithromycin     GI upset  . Codeine Phosphate     REACTION: difficulty breathing; vomiting  . Hydrochlorothiazide   . Peanuts  [Peanut Oil] Swelling  . Penicillins     REACTION: difficulty breathing; rash; vomiting  . Sulfacetamide Sodium     REACTION: difficulty breathing, rash, vomiting  . Sulfa Antibiotics Rash     Current Outpatient Prescriptions:  .  albuterol (PROAIR HFA) 108 (90 Base) MCG/ACT inhaler, Inhale 1 puff into the lungs as needed., Disp: 1 Inhaler, Rfl: 12 .  ALPRAZolam (XANAX) 0.5 MG tablet, TAKE 1/2 TO 1 TABLET TWICE A DAY AS NEEDED, Disp: 60 tablet, Rfl: 5 .  aspirin 81 MG tablet, Take 1 tablet by mouth daily., Disp: , Rfl:  .  EPINEPHrine 0.3 mg/0.3 mL IJ SOAJ injection, Inject into the muscle once., Disp: , Rfl:  .  escitalopram (LEXAPRO) 20 MG tablet, TAKE 1 TABLET (20 MG TOTAL) BY MOUTH DAILY., Disp: 90 tablet, Rfl: 1 .  esomeprazole (NEXIUM) 40 MG capsule, TAKE 1 CAPSULE (40 MG TOTAL) BY MOUTH DAILY., Disp: 90 capsule, Rfl: 3 .  fluticasone (FLONASE) 50 MCG/ACT nasal spray, Place 2 sprays into both nostrils daily., Disp: 16 g, Rfl: 5 .  glucose blood (ONE TOUCH ULTRA TEST) test strip, To check blood sugar once a day.  DX: E11.9, Disp: 100 each, Rfl: 3 .  metFORMIN (GLUCOPHAGE) 500 MG tablet, TAKE 1 TABLET (500 MG TOTAL) BY MOUTH 2 (TWO) TIMES  DAILY., Disp: 60 tablet, Rfl: 5 .  naproxen (NAPROSYN) 500 MG tablet, Take 1 tablet (500 mg total) by mouth 2 (two) times daily as needed., Disp: 60 tablet, Rfl: 5 .  nystatin ointment (MYCOSTATIN), Apply 1 application topically 2 (two) times daily. To affected area, Disp: 60 g, Rfl: 1 .  quinapril (ACCUPRIL) 20 MG tablet, Take 1 tablet (20 mg total) by mouth daily., Disp: 90 tablet, Rfl: 3 .  simvastatin (ZOCOR) 20 MG tablet, TAKE 1 TABLET BY MOUTH AT BEDTIME, Disp: 30 tablet, Rfl: 11 .  Vitamin D, Ergocalciferol, (DRISDOL) 50000 units CAPS capsule, TAKE 1 CAPSULE BY MOUTH ONCE A MONTH, Disp: 12 capsule, Rfl: 0  Review of Systems  Constitutional: Negative.   Respiratory: Negative.   Cardiovascular: Negative.   Endocrine: Negative.   Allergic/Immunologic: Negative.   Neurological: Negative.   Hematological: Negative.   Psychiatric/Behavioral: Negative.     Social History  Substance Use Topics  . Smoking status: Never Smoker  . Smokeless tobacco: Never Used  . Alcohol use No   Objective:   BP 120/72 (BP Location: Left Arm, Patient Position: Sitting, Cuff Size: Normal)   Pulse 80   Temp 98.2 F (36.8 C)   Wt 164 lb (74.4 kg)   SpO2 96%   BMI 28.15 kg/m  Vitals:   05/10/17 1332  BP: 120/72  Pulse: 80  Temp: 98.2 F (36.8 C)  SpO2: 96%  Weight: 164 lb (74.4 kg)     Physical Exam  Constitutional: She is oriented to person, place, and time. She appears well-developed and well-nourished.  HENT:  Head: Normocephalic and atraumatic.  Eyes: Conjunctivae are normal. No scleral icterus.  Neck: No thyromegaly present.  Cardiovascular: Normal rate, regular rhythm and normal heart sounds.   Pulmonary/Chest: Effort normal and breath sounds normal.  Musculoskeletal: She exhibits tenderness. She exhibits no edema.  Patient experiences tenderness all throughout thoracic area.   Neurological: She is alert and oriented to person, place, and time.  Skin: Skin is warm and dry.   Psychiatric: She has a normal mood and affect. Her behavior is normal. Judgment and thought content normal.        Assessment & Plan:     1. Essential hypertension Stable. F/U in 6 months for CPE w/ Dr. B here in the office.   2. Type 2 diabetes mellitus without complication, without long-term current use of insulin (HCC) HgbA1c was 7.6 today. Increase Metformin to 10102m BID for better control.  - POCT glycosylated hemoglobin (Hb A1C) - metFORMIN (GLUCOPHAGE) 1000 MG tablet; Take 1 tablet (1,000 mg total) by mouth 2 (two) times daily with a meal.  Dispense: 60 tablet; Refill: 5 - glucose blood (ONE TOUCH ULTRA TEST) test strip; To check blood sugar once a day.  DX: E11.9  Dispense: 100 each; Refill: 3  3. Depression, unspecified depression type/likely Bipolar Patient declined psychiatry referral. Will continue to monitor for now.  - escitalopram (LEXAPRO) 20 MG tablet; Take 1 tablet (20 mg total) by mouth daily.  Dispense: 90 tablet; Refill: 1  4. Chronic low back pain, unspecified back pain laterality, with sciatica presence unspecified Start lower back stretches and exercises to help with the pain. Continue with PRN Naproxen.   5. Avitaminosis D - Vitamin D, Ergocalciferol, (DRISDOL) 50000 units CAPS capsule; Take 1 capsule (50,000 Units total) by mouth every 7 (seven) days.  Dispense: 12 capsule; Refill: 0        I have done the exam and reviewed the above chart and it is accurate to the best of my knowledge. Development worker, community has been used in this note in any air is in the dictation or transcription are unintentional.  Wilhemena Durie, MD  [place]

## 2017-05-12 ENCOUNTER — Other Ambulatory Visit: Payer: Self-pay

## 2017-05-12 MED ORDER — FREESTYLE LANCETS MISC: 12 refills | Status: DC

## 2017-05-12 MED ORDER — FREESTYLE LITE DEVI: 0 refills | Status: DC

## 2017-05-12 MED ORDER — GLUCOSE BLOOD VI STRP: ORAL_STRIP | 12 refills | Status: DC

## 2017-05-27 ENCOUNTER — Other Ambulatory Visit: Payer: Self-pay | Admitting: Family Medicine

## 2017-05-27 DIAGNOSIS — F329 Major depressive disorder, single episode, unspecified: Secondary | ICD-10-CM

## 2017-05-27 DIAGNOSIS — F32A Depression, unspecified: Secondary | ICD-10-CM

## 2017-06-01 ENCOUNTER — Other Ambulatory Visit: Payer: Self-pay | Admitting: Family Medicine

## 2017-06-01 DIAGNOSIS — E119 Type 2 diabetes mellitus without complications: Secondary | ICD-10-CM

## 2017-06-01 DIAGNOSIS — I10 Essential (primary) hypertension: Secondary | ICD-10-CM

## 2017-06-01 NOTE — Telephone Encounter (Signed)
[facility] faxed a request for a 90-days supply on the following medications. Thanks CC   simvastatin (ZOCOR) 20 MG tablet  Take 1 Tablet by mouth at bedtime.  quinapril (ACCUPRIL) 20 MG tablet  Take 1 Tablet by mouth daily.  metFORMIN (GLUCOPHAGE) 1000 MG tablet  Take 1 Tablet ( 500 MG Total ) by mouth 2 times daily.  naproxen (NAPROSYN) 500 MG tablet

## 2017-06-02 ENCOUNTER — Telehealth: Payer: Self-pay | Admitting: Family Medicine

## 2017-06-02 NOTE — Telephone Encounter (Addendum)
[facility] faxed a  refill request on the following medications:  simvastatin (ZOCOR) 20 MG tablet.  Take 1 tablet by mouth at bedtime.  90 day supply.    naproxen (NAPROSYN) 500 MG tablet.  Take 1 tablet by mouth 2 times daily.  90 day supply.  quinapril (ACCUPRIL) 20 MG tablet.  Take 1 tablet by mouth daily.  90 day supply.   [facility] [employer] [facility]

## 2017-06-05 ENCOUNTER — Telehealth: Payer: Self-pay | Admitting: Family Medicine

## 2017-06-05 MED ORDER — QUINAPRIL HCL 20 MG PO TABS: 20.0000 mg | ORAL_TABLET | Freq: Every day | ORAL | 12 refills | Status: DC

## 2017-06-05 MED ORDER — NAPROXEN 500 MG PO TABS: 500.0000 mg | ORAL_TABLET | Freq: Two times a day (BID) | ORAL | 5 refills | Status: DC | PRN

## 2017-06-05 MED ORDER — METFORMIN HCL 1000 MG PO TABS: 1000.0000 mg | ORAL_TABLET | Freq: Two times a day (BID) | ORAL | 12 refills | Status: DC

## 2017-06-05 MED ORDER — SIMVASTATIN 20 MG PO TABS: 20.0000 mg | ORAL_TABLET | Freq: Every day | ORAL | 11 refills | Status: DC

## 2017-06-05 NOTE — Telephone Encounter (Signed)
Ok to decrease Metformin? Please advise. Thanks!

## 2017-06-05 NOTE — Telephone Encounter (Signed)
ok 

## 2017-06-05 NOTE — Telephone Encounter (Signed)
Pt states she is having acid reflux and diarrhea really bad.  Pt is asking if this is coming from the Rx metFORMIN (GLUCOPHAGE) 1000 MG tablet.  Pt is asking if she can go back to 500MG 2 times a day?  Please advise.  NP#[phone]/[id number]

## 2017-06-08 NOTE — Telephone Encounter (Signed)
yes

## 2017-06-08 NOTE — Telephone Encounter (Signed)
Ptis returning call.  IC#[phone]/[id number]

## 2017-06-08 NOTE — Telephone Encounter (Signed)
lmtcb Tylek Boney Drozdowski, CMA  

## 2017-06-12 NOTE — Telephone Encounter (Signed)
Advised  ED 

## 2017-08-07 ENCOUNTER — Encounter: Payer: BLUE CROSS/BLUE SHIELD | Admitting: Family Medicine

## 2017-08-08 ENCOUNTER — Ambulatory Visit (INDEPENDENT_AMBULATORY_CARE_PROVIDER_SITE_OTHER): Payer: BLUE CROSS/BLUE SHIELD | Admitting: Family Medicine

## 2017-08-08 ENCOUNTER — Telehealth: Payer: Self-pay

## 2017-08-08 VITALS — BP 132/84 | HR 78 | Temp 97.8°F | Resp 16 | Wt 162.0 lb

## 2017-08-08 DIAGNOSIS — R002 Palpitations: Secondary | ICD-10-CM | POA: Diagnosis not present

## 2017-08-08 DIAGNOSIS — R0602 Shortness of breath: Secondary | ICD-10-CM | POA: Diagnosis not present

## 2017-08-08 DIAGNOSIS — R42 Dizziness and giddiness: Secondary | ICD-10-CM | POA: Diagnosis not present

## 2017-08-08 NOTE — Telephone Encounter (Signed)
Patient called saying that she feels "something is wrong with her heart". She is c/o heart fluttering that has been off and on for the last 2 weeks. She denies any shortness of breath, dizzy spells, headache, chest pain, or numbness and tingling in her extremities. Patient has not had any recent changes to medications. She reports that nothing makes her symptoms worse, nothing makes it better. She has not started any herbal supplements or home remedies. Appt was scheduled for this afternoon for evaluation. Patient only wanted to see Dr Rosanna Randy. Advised patient that if her symptoms worsen, that she would need to go to the ER. Patient verbalized understanding.

## 2017-08-08 NOTE — Progress Notes (Signed)
Hailey Horton  MRN: [id number] DOB: 1961/08/16  Subjective:  HPI   The patient is a 56 year old female who presents for evaluation of dizziness, near syncope and heart fluttering.  Patient reports that 2 weeks ago she almost passed out.  She said she got lightheaded and it caused her to fall.  She did not actually lose consciousness.  At that time she also had chest heaviness, nausea, headache and tingling in her left arm.  Since that time she has continued to feel foggy, have heart flutters, SOB on exertion and has trouble concentrating.   Patient had cardiac work up prior to 2010 including treadmill.  She states she was unable to do the treadmill because of her back pain.  She states they sent her to the cardiologist because of flutters and fatigue.   Patient Active Problem List   Diagnosis Date Noted  . Hyperglycemia 01/10/2017  . Sinusitis, acute 04/06/2016  . Adaptation reaction 08/28/2015  . Allergic to bees 08/28/2015  . Carpal tunnel syndrome 08/28/2015  . Chronic LBP 08/28/2015  . Coxsackie viral disease 08/28/2015  . Clinical depression 08/28/2015  . Diabetes ([place]) 08/28/2015  . DUB (dysfunctional uterine bleeding) 08/28/2015  . Endometriosis 08/28/2015  . Bleeding from the nose 08/28/2015  . Acid reflux 08/28/2015  . Gouty arthritis of toe 08/28/2015  . Enteroviral vesicular stomatitis with exanthem 08/28/2015  . Blood in the urine 08/28/2015  . Hypercholesteremia 08/28/2015  . BP (high blood pressure) 08/28/2015  . Leg paresthesia 08/28/2015  . NASH (nonalcoholic steatohepatitis) 08/28/2015  . Degenerative arthritis of toe joint 08/28/2015  . Osteopenia 08/28/2015  . Allergic to peanuts 08/28/2015  . Avitaminosis D 08/28/2015  . Phlebectasia 08/28/2015  . Bursitis, trochanteric 08/28/2015  . Hyperlipidemia 06/09/2008  . Anxiety 06/09/2008  . Depression 06/09/2008  . Essential hypertension 06/09/2008  . Allergic rhinitis 06/09/2008  . GERD 06/09/2008  .  HEADACHE 06/09/2008  . COLONIC POLYPS, HX OF 06/09/2008    Past Medical History:  Diagnosis Date  . Allergy   . Anxiety   . Depression   . Diabetes mellitus without complication ([place])   . GERD (gastroesophageal reflux disease)   . Hyperlipidemia   . Hypertension     Social History   Social History  . Marital status: Married    Spouse name: N/A  . Number of children: N/A  . Years of education: N/A   Occupational History  . Not on file.   Social History Main Topics  . Smoking status: Never Smoker  . Smokeless tobacco: Never Used  . Alcohol use No  . Drug use: No  . Sexual activity: Not on file   Other Topics Concern  . Not on file   Social History Narrative  . No narrative on file    Outpatient Encounter Prescriptions as of 08/08/2017  Medication Sig Note  . albuterol (PROAIR HFA) 108 (90 Base) MCG/ACT inhaler Inhale 1 puff into the lungs as needed.   . ALPRAZolam (XANAX) 0.5 MG tablet TAKE 1/2 TO 1 TABLET TWICE A DAY AS NEEDED   . aspirin 81 MG tablet Take 1 tablet by mouth daily. 09/17/2015: Received from: [place]: Take by mouth.  . Blood Glucose Monitoring Suppl (FREESTYLE LITE) DEVI Needs a meter to check sugar once daily DX E11.9   . EPINEPHrine 0.3 mg/0.3 mL IJ SOAJ injection Inject into the muscle once. 01/10/2017: prn  . escitalopram (LEXAPRO) 20 MG tablet Take 1 tablet (20 mg total)  by mouth daily.   Marland Kitchen escitalopram (LEXAPRO) 20 MG tablet TAKE 1 TABLET (20 MG TOTAL) BY MOUTH DAILY.   Marland Kitchen esomeprazole (NEXIUM) 40 MG capsule TAKE 1 CAPSULE (40 MG TOTAL) BY MOUTH DAILY.   . fluticasone (FLONASE) 50 MCG/ACT nasal spray Place 2 sprays into both nostrils daily.   Marland Kitchen glucose blood (FREESTYLE LITE) test strip Check sugar once daily DX E11.9, freestyle lite strips   . Lancets (FREESTYLE) lancets Check sugar once daily. Needs freestyle lite lancets. DX E11.9   . metFORMIN (GLUCOPHAGE) 1000 MG tablet Take 1 tablet (1,000 mg total) by mouth  2 (two) times daily with a meal. (Patient taking differently: Take 1,000 mg by mouth daily with breakfast. )   . naproxen (NAPROSYN) 500 MG tablet Take 1 tablet (500 mg total) by mouth 2 (two) times daily as needed.   . nystatin ointment (MYCOSTATIN) Apply 1 application topically 2 (two) times daily. To affected area   . quinapril (ACCUPRIL) 20 MG tablet Take 1 tablet (20 mg total) by mouth daily.   . simvastatin (ZOCOR) 20 MG tablet Take 1 tablet (20 mg total) by mouth at bedtime.   . Vitamin D, Ergocalciferol, (DRISDOL) 50000 units CAPS capsule Take 1 capsule (50,000 Units total) by mouth every 7 (seven) days. (Patient taking differently: Take 50,000 Units by mouth every 30 (thirty) days. )    No facility-administered encounter medications on file as of 08/08/2017.     Allergies  Allergen Reactions  . Acetaminophen-Codeine   . Clarithromycin     GI upset  . Codeine Phosphate     REACTION: difficulty breathing; vomiting  . Hydrochlorothiazide   . Peanuts  [Peanut Oil] Swelling  . Penicillins     REACTION: difficulty breathing; rash; vomiting  . Sulfacetamide Sodium     REACTION: difficulty breathing, rash, vomiting  . Sulfa Antibiotics Rash    Review of Systems  Constitutional: Positive for malaise/fatigue. Negative for fever.  Respiratory: Positive for shortness of breath (with exertion. x a few months). Negative for cough and wheezing.   Cardiovascular: Positive for palpitations and claudication (maybe sometimes). Negative for chest pain, orthopnea and leg swelling.  Gastrointestinal: Negative.   Neurological: Positive for dizziness, tingling and headaches. Negative for tremors, sensory change, speech change and weakness.  Endo/Heme/Allergies: Negative.   Psychiatric/Behavioral: Negative.     Objective:  BP 132/84 (BP Location: Right Arm, Patient Position: Sitting, Cuff Size: Normal)   Pulse 78   Temp 97.8 F (36.6 C) (Oral)   Resp 16   Wt 162 lb (73.5 kg)   SpO2 97%    BMI 27.81 kg/m   Physical Exam  Constitutional: She is oriented to person, place, and time and well-developed, well-nourished, and in no distress.  HENT:  Head: Normocephalic and atraumatic.  Right Ear: External ear normal.  Left Ear: External ear normal.  Nose: Nose normal.  Eyes: Pupils are equal, round, and reactive to light.  Neck: Normal range of motion.  Cardiovascular: Normal rate, regular rhythm, normal heart sounds and intact distal pulses.   Mild varicose veins of her right lower extremity  Pulmonary/Chest: Effort normal and breath sounds normal.  Neurological: She is alert and oriented to person, place, and time. Gait normal.  Skin: Skin is warm and dry.  Psychiatric: Mood, memory, affect and judgment normal.    Assessment and Plan :   1. Palpitation  - EKG 12-Lead - CBC with Differential/Platelet - Comprehensive metabolic panel - TSH - Ambulatory referral to Cardiology  2.  Shortness of breath  - CBC with Differential/Platelet - Comprehensive metabolic panel - TSH - Ambulatory referral to Cardiology  3. Dizziness  - CBC with Differential/Platelet - Comprehensive metabolic panel - TSH - Ambulatory referral to Cardiology 4.Probable Bipolar Depression In remission.  HPI, Exam and A&P Transcribed under the direction and in the presence of Miguel Aschoff, Brooke Bonito., MD. Electronically Signed: Althea Charon, RMA I have done the exam and reviewed the chart and it is accurate to the best of my knowledge. Development worker, community has been used and  any errors in dictation or transcription are unintentional. Miguel Aschoff M.D. [facility]

## 2017-08-09 ENCOUNTER — Other Ambulatory Visit: Payer: Self-pay | Admitting: Family Medicine

## 2017-08-09 DIAGNOSIS — E559 Vitamin D deficiency, unspecified: Secondary | ICD-10-CM

## 2017-08-09 LAB — CBC WITH DIFFERENTIAL/PLATELET
BASOS ABS: 0 10*3/uL (ref 0.0–0.2)
Basos: 1 %
EOS (ABSOLUTE): 0.2 10*3/uL (ref 0.0–0.4)
Eos: 3 %
Hematocrit: 40.5 % (ref 34.0–46.6)
Hemoglobin: 13.1 g/dL (ref 11.1–15.9)
Immature Grans (Abs): 0 10*3/uL (ref 0.0–0.1)
Immature Granulocytes: 0 %
LYMPHS ABS: 2.1 10*3/uL (ref 0.7–3.1)
Lymphs: 29 %
MCH: 27.9 pg (ref 26.6–33.0)
MCHC: 32.3 g/dL (ref 31.5–35.7)
MCV: 86 fL (ref 79–97)
MONOCYTES: 6 %
MONOS ABS: 0.4 10*3/uL (ref 0.1–0.9)
NEUTROS ABS: 4.5 10*3/uL (ref 1.4–7.0)
Neutrophils: 61 %
PLATELETS: 340 10*3/uL (ref 150–379)
RBC: 4.69 x10E6/uL (ref 3.77–5.28)
RDW: 14.4 % (ref 12.3–15.4)
WBC: 7.3 10*3/uL (ref 3.4–10.8)

## 2017-08-09 LAB — COMPREHENSIVE METABOLIC PANEL
ALK PHOS: 111 IU/L (ref 39–117)
ALT: 66 IU/L — ABNORMAL HIGH (ref 0–32)
AST: 49 IU/L — ABNORMAL HIGH (ref 0–40)
Albumin/Globulin Ratio: 1.7 (ref 1.2–2.2)
Albumin: 4.5 g/dL (ref 3.5–5.5)
BILIRUBIN TOTAL: 0.2 mg/dL (ref 0.0–1.2)
BUN / CREAT RATIO: 28 — AB (ref 9–23)
BUN: 22 mg/dL (ref 6–24)
CHLORIDE: 105 mmol/L (ref 96–106)
CO2: 22 mmol/L (ref 20–29)
CREATININE: 0.79 mg/dL (ref 0.57–1.00)
Calcium: 9.9 mg/dL (ref 8.7–10.2)
GFR calc Af Amer: 96 mL/min/{1.73_m2} (ref >59)
GFR calc non Af Amer: 83 mL/min/{1.73_m2} (ref >59)
GLUCOSE: 112 mg/dL — AB (ref 65–99)
Globulin, Total: 2.6 g/dL (ref 1.5–4.5)
Potassium: 5.3 mmol/L — ABNORMAL HIGH (ref 3.5–5.2)
SODIUM: 142 mmol/L (ref 134–144)
Total Protein: 7.1 g/dL (ref 6.0–8.5)

## 2017-08-09 LAB — TSH: TSH: 1.65 u[IU]/mL (ref 0.450–4.500)

## 2017-08-10 ENCOUNTER — Telehealth: Payer: Self-pay

## 2017-08-10 DIAGNOSIS — Z1159 Encounter for screening for other viral diseases: Secondary | ICD-10-CM

## 2017-08-10 DIAGNOSIS — K760 Fatty (change of) liver, not elsewhere classified: Secondary | ICD-10-CM

## 2017-08-10 NOTE — Telephone Encounter (Signed)
For hep B and Hep A do you want Qualitative or Quantitative? Please advise. Thanks!

## 2017-08-10 NOTE — Telephone Encounter (Signed)
Dr. Darnell Level, before I call the patient, she has not had Hep C checked. Would you like to have this done? Please advise. Thanks!

## 2017-08-10 NOTE — Telephone Encounter (Signed)
Qualitative

## 2017-08-10 NOTE — Telephone Encounter (Signed)
-----   Message from Jerrol Banana., MD sent at 08/10/2017  8:12 AM EDT ----- Labs ok--mild elevation of liver--most likeley due to fatty liver--please make sure Hep C has been done--pt to work on diet and exercise.

## 2017-08-10 NOTE — Telephone Encounter (Signed)
Yes--HepC,B and A--thx

## 2017-08-11 DIAGNOSIS — K760 Fatty (change of) liver, not elsewhere classified: Secondary | ICD-10-CM | POA: Diagnosis not present

## 2017-08-11 DIAGNOSIS — Z1159 Encounter for screening for other viral diseases: Secondary | ICD-10-CM | POA: Diagnosis not present

## 2017-08-11 NOTE — Telephone Encounter (Signed)
Lmtcb, lab slip placed up front for patient to get this done-aa

## 2017-08-11 NOTE — Telephone Encounter (Signed)
Pt advised-aa 

## 2017-08-12 LAB — HEPATITIS C ANTIBODY: Hep C Virus Ab: <0.1 s/co ratio (ref 0.0–0.9)

## 2017-08-12 LAB — HEPATITIS B SURFACE ANTIBODY,QUALITATIVE: Hep B Surface Ab, Qual: NONREACTIVE

## 2017-08-12 LAB — HEPATITIS A ANTIBODY, TOTAL: HEP A TOTAL AB: POSITIVE — AB

## 2017-08-22 ENCOUNTER — Ambulatory Visit (INDEPENDENT_AMBULATORY_CARE_PROVIDER_SITE_OTHER): Payer: BLUE CROSS/BLUE SHIELD | Admitting: Family Medicine

## 2017-08-22 VITALS — BP 140/82 | HR 72 | Temp 98.3°F | Resp 16 | Wt 162.0 lb

## 2017-08-22 DIAGNOSIS — F32A Depression, unspecified: Secondary | ICD-10-CM

## 2017-08-22 DIAGNOSIS — K7581 Nonalcoholic steatohepatitis (NASH): Secondary | ICD-10-CM

## 2017-08-22 DIAGNOSIS — F329 Major depressive disorder, single episode, unspecified: Secondary | ICD-10-CM

## 2017-08-22 NOTE — Progress Notes (Signed)
Hailey Horton  MRN: [id number] DOB: [birth year]  Subjective:  HPI   The patient is a 56 year old female who presents today for follow up of abnormal liver enzymes and positive Hepatitis A test.  The patient was last seen on 08/08/17 for palpitations, dizziness, SOB and depression.      Patient Active Problem List   Diagnosis Date Noted  . Hyperglycemia 01/10/2017  . Sinusitis, acute 04/06/2016  . Adaptation reaction 08/28/2015  . Allergic to bees 08/28/2015  . Carpal tunnel syndrome 08/28/2015  . Chronic LBP 08/28/2015  . Coxsackie viral disease 08/28/2015  . Clinical depression 08/28/2015  . Diabetes ([place]) 08/28/2015  . DUB (dysfunctional uterine bleeding) 08/28/2015  . Endometriosis 08/28/2015  . Bleeding from the nose 08/28/2015  . Acid reflux 08/28/2015  . Gouty arthritis of toe 08/28/2015  . Enteroviral vesicular stomatitis with exanthem 08/28/2015  . Blood in the urine 08/28/2015  . Hypercholesteremia 08/28/2015  . BP (high blood pressure) 08/28/2015  . Leg paresthesia 08/28/2015  . NASH (nonalcoholic steatohepatitis) 08/28/2015  . Degenerative arthritis of toe joint 08/28/2015  . Osteopenia 08/28/2015  . Allergic to peanuts 08/28/2015  . Avitaminosis D 08/28/2015  . Phlebectasia 08/28/2015  . Bursitis, trochanteric 08/28/2015  . Hyperlipidemia 06/09/2008  . Anxiety 06/09/2008  . Depression 06/09/2008  . Essential hypertension 06/09/2008  . Allergic rhinitis 06/09/2008  . GERD 06/09/2008  . HEADACHE 06/09/2008  . COLONIC POLYPS, HX OF 06/09/2008    Past Medical History:  Diagnosis Date  . Allergy   . Anxiety   . Depression   . Diabetes mellitus without complication ([place])   . GERD (gastroesophageal reflux disease)   . Hyperlipidemia   . Hypertension     Social History   Social History  . Marital status: Married    Spouse name: N/A  . Number of children: N/A  . Years of education: N/A   Occupational History  . Not on file.   Social  History Main Topics  . Smoking status: Never Smoker  . Smokeless tobacco: Never Used  . Alcohol use No  . Drug use: No  . Sexual activity: Not on file   Other Topics Concern  . Not on file   Social History Narrative  . No narrative on file    Outpatient Encounter Prescriptions as of 08/22/2017  Medication Sig Note  . albuterol (PROAIR HFA) 108 (90 Base) MCG/ACT inhaler Inhale 1 puff into the lungs as needed.   . ALPRAZolam (XANAX) 0.5 MG tablet TAKE 1/2 TO 1 TABLET TWICE A DAY AS NEEDED   . aspirin 81 MG tablet Take 1 tablet by mouth daily. 09/17/2015: Received from: [facility]: Take by mouth.  . Blood Glucose Monitoring Suppl (FREESTYLE LITE) DEVI Needs a meter to check sugar once daily DX E11.9   . diclofenac sodium (VOLTAREN) 1 % GEL Apply 2 g topically 4 (four) times daily.   Marland Kitchen EPINEPHrine 0.3 mg/0.3 mL IJ SOAJ injection Inject into the muscle once. 01/10/2017: prn  . escitalopram (LEXAPRO) 20 MG tablet Take 1 tablet (20 mg total) by mouth daily.   Marland Kitchen escitalopram (LEXAPRO) 20 MG tablet TAKE 1 TABLET (20 MG TOTAL) BY MOUTH DAILY.   Marland Kitchen esomeprazole (NEXIUM) 40 MG capsule TAKE 1 CAPSULE (40 MG TOTAL) BY MOUTH DAILY.   . fluticasone (FLONASE) 50 MCG/ACT nasal spray Place 2 sprays into both nostrils daily.   Marland Kitchen glucose blood (FREESTYLE LITE) test strip Check sugar once daily DX E11.9, freestyle lite  strips   . Lancets (FREESTYLE) lancets Check sugar once daily. Needs freestyle lite lancets. DX E11.9   . metFORMIN (GLUCOPHAGE) 1000 MG tablet Take 1 tablet (1,000 mg total) by mouth 2 (two) times daily with a meal. (Patient taking differently: Take 1,000 mg by mouth daily with breakfast. )   . naproxen (NAPROSYN) 500 MG tablet Take 1 tablet (500 mg total) by mouth 2 (two) times daily as needed.   . nystatin ointment (MYCOSTATIN) Apply 1 application topically 2 (two) times daily. To affected area   . quinapril (ACCUPRIL) 20 MG tablet Take 1 tablet (20 mg total) by  mouth daily.   . simvastatin (ZOCOR) 20 MG tablet Take 1 tablet (20 mg total) by mouth at bedtime.   . Vitamin D, Ergocalciferol, (DRISDOL) 50000 units CAPS capsule TAKE 1 CAPSULE (50,000 UNITS TOTAL) BY MOUTH EVERY 7 (SEVEN) DAYS.    No facility-administered encounter medications on file as of 08/22/2017.     Allergies  Allergen Reactions  . Acetaminophen-Codeine   . Clarithromycin     GI upset  . Codeine Phosphate     REACTION: difficulty breathing; vomiting  . Hydrochlorothiazide   . Peanuts  [Peanut Oil] Swelling  . Penicillins     REACTION: difficulty breathing; rash; vomiting  . Sulfacetamide Sodium     REACTION: difficulty breathing, rash, vomiting  . Sulfa Antibiotics Rash    Review of Systems  Constitutional: Positive for malaise/fatigue. Negative for fever and weight loss.  Respiratory: Negative for cough, shortness of breath and wheezing.   Cardiovascular: Positive for palpitations. Negative for chest pain and orthopnea.  Gastrointestinal: Positive for nausea. Negative for abdominal pain, blood in stool, constipation, diarrhea, heartburn, melena and vomiting.  Neurological: Negative for weakness.    Objective:  BP 140/82 (BP Location: Right Arm, Patient Position: Sitting, Cuff Size: Normal)   Pulse 72   Temp 98.3 F (36.8 C) (Oral)   Resp 16   Wt 162 lb (73.5 kg)   BMI 27.81 kg/m   Physical Exam  Constitutional: She is well-developed, well-nourished, and in no distress.  HENT:  Head: Normocephalic.  Eyes: Pupils are equal, round, and reactive to light.  Neck: Normal range of motion.  Cardiovascular: Normal rate, regular rhythm and normal heart sounds.   Pulmonary/Chest: Effort normal and breath sounds normal.    Assessment and Plan :  Elevated LFTs with positive Hep A Spoke with the [facility] Communicable disease department and per their recommendation the patient does not need follow up labs or revaccination.  She stated that the Hep A Total test does not  indicate active infection.  She said that the patient may have been infected back 1 month ago when she was having diarrhea, however due to her being asymptomatic they would consider this test to be a false positive and not a reportable case.  She did advise that because it may indicate a past infection that the pateint should recommend her close contacts be vaccinated.   Follow clinically.False positive Hep A --wrong test ordered.  I have done the exam and reviewed the chart and it is accurate to the best of my knowledge. Development worker, community has been used and  any errors in dictation or transcription are unintentional. Miguel Aschoff M.D. [facility]

## 2017-09-05 ENCOUNTER — Other Ambulatory Visit: Payer: Self-pay | Admitting: Family Medicine

## 2017-09-05 DIAGNOSIS — E119 Type 2 diabetes mellitus without complications: Secondary | ICD-10-CM

## 2017-09-06 ENCOUNTER — Ambulatory Visit: Payer: Self-pay | Admitting: Family Medicine

## 2017-09-12 ENCOUNTER — Encounter: Payer: Self-pay | Admitting: Family Medicine

## 2017-09-12 ENCOUNTER — Ambulatory Visit (INDEPENDENT_AMBULATORY_CARE_PROVIDER_SITE_OTHER): Payer: BLUE CROSS/BLUE SHIELD | Admitting: Family Medicine

## 2017-09-12 VITALS — BP 110/70 | HR 72 | Temp 97.8°F | Resp 16 | Ht 63.0 in | Wt 159.4 lb

## 2017-09-12 DIAGNOSIS — Z0000 Encounter for general adult medical examination without abnormal findings: Secondary | ICD-10-CM | POA: Diagnosis not present

## 2017-09-12 DIAGNOSIS — Z124 Encounter for screening for malignant neoplasm of cervix: Secondary | ICD-10-CM

## 2017-09-12 DIAGNOSIS — Z1231 Encounter for screening mammogram for malignant neoplasm of breast: Secondary | ICD-10-CM

## 2017-09-12 DIAGNOSIS — Z23 Encounter for immunization: Secondary | ICD-10-CM | POA: Diagnosis not present

## 2017-09-12 DIAGNOSIS — E119 Type 2 diabetes mellitus without complications: Secondary | ICD-10-CM

## 2017-09-12 DIAGNOSIS — Z1239 Encounter for other screening for malignant neoplasm of breast: Secondary | ICD-10-CM

## 2017-09-12 NOTE — Progress Notes (Signed)
Patient: Hailey Horton, Female    DOB: [birth year], 56 y.o.   MRN: [id number] Visit Date: 09/12/2017  Today's Provider: Lavon Paganini, MD   Chief Complaint  Patient presents with  . Annual Exam   Subjective:    Annual physical exam Hailey Horton is a 56 y.o. female who presents today for health maintenance and complete physical. She feels fairly well. She reports exercising none. She reports she is sleeping poorly. Patient reports her brother died on 03/20/23 due to liver failure.   Last pap-12/08/2010- Negative. HPV negative. Patient reports she had a pap in 2014 at [facility] in [place].  Last mammogram- 07/19/2013- BI-RADS 2 Last colonoscopy- 08/13/2009- internal hemorrhoids.  -----------------------------------------------------------------   Review of Systems  Constitutional: Positive for fatigue.       Crying  HENT: Positive for nosebleeds, sinus pressure and tinnitus.   Eyes: Positive for photophobia.       Dry eyes  Respiratory: Negative.   Cardiovascular: Negative.   Gastrointestinal: Positive for constipation, diarrhea and nausea.  Endocrine: Negative.   Genitourinary: Positive for vaginal pain (with intercourse).  Musculoskeletal: Positive for back pain and neck pain.  Skin: Negative.   Allergic/Immunologic: Positive for food allergies.  Neurological: Positive for headaches.  Hematological: Negative.   Psychiatric/Behavioral: Positive for suicidal ideas.    Social History      She  reports that she has never smoked. She has never used smokeless tobacco. She reports that she does not drink alcohol or use drugs.       Social History   Social History  . Marital status: Married    Spouse name: N/A  . Number of children: N/A  . Years of education: N/A   Social History Main Topics  . Smoking status: Never Smoker  . Smokeless tobacco: Never Used  . Alcohol use No  . Drug use: No  . Sexual activity: Not Asked   Other Topics Concern  . None     Social History Narrative  . None    Past Medical History:  Diagnosis Date  . Allergy   . Anxiety   . Depression   . Diabetes mellitus without complication ([place])   . GERD (gastroesophageal reflux disease)   . Hyperlipidemia   . Hypertension      Patient Active Problem List   Diagnosis Date Noted  . Hyperglycemia 01/10/2017  . Sinusitis, acute 04/06/2016  . Adaptation reaction 08/28/2015  . Allergic to bees 08/28/2015  . Carpal tunnel syndrome 08/28/2015  . Chronic LBP 08/28/2015  . Coxsackie viral disease 08/28/2015  . Clinical depression 08/28/2015  . Diabetes ([place]) 08/28/2015  . DUB (dysfunctional uterine bleeding) 08/28/2015  . Endometriosis 08/28/2015  . Bleeding from the nose 08/28/2015  . Acid reflux 08/28/2015  . Gouty arthritis of toe 08/28/2015  . Enteroviral vesicular stomatitis with exanthem 08/28/2015  . Blood in the urine 08/28/2015  . Hypercholesteremia 08/28/2015  . BP (high blood pressure) 08/28/2015  . Leg paresthesia 08/28/2015  . NASH (nonalcoholic steatohepatitis) 08/28/2015  . Degenerative arthritis of toe joint 08/28/2015  . Osteopenia 08/28/2015  . Allergic to peanuts 08/28/2015  . Avitaminosis D 08/28/2015  . Phlebectasia 08/28/2015  . Bursitis, trochanteric 08/28/2015  . Hyperlipidemia 06/09/2008  . Anxiety 06/09/2008  . Depression 06/09/2008  . Essential hypertension 06/09/2008  . Allergic rhinitis 06/09/2008  . GERD 06/09/2008  . HEADACHE 06/09/2008  . COLONIC POLYPS, HX OF 06/09/2008    Past Surgical History:  Procedure Laterality Date  . SPINE  SURGERY  03/17/10   ruptured disck in lower back  . TUBAL LIGATION    . WRIST SURGERY  1994/1979    Family History        Family Status  Relation Status  . Mother Deceased at age 13  . Father Deceased at age 85       MI  . Sister Deceased at age 44       MI  . Brother Deceased at age 56       liver failure  . Mat Aunt (Not Specified)        Her family history includes  Anemia in her mother; Arthritis in her sister; CVA in her mother; Cancer in her maternal aunt and mother; Diabetes in her sister; Epilepsy in her father; Heart disease in her mother and sister; Hypertension in her mother; Leukemia in her mother.     Allergies  Allergen Reactions  . Acetaminophen-Codeine   . Clarithromycin     GI upset  . Codeine Phosphate     REACTION: difficulty breathing; vomiting  . Hydrochlorothiazide   . Peanuts  [Peanut Oil] Swelling  . Penicillins     REACTION: difficulty breathing; rash; vomiting  . Sulfacetamide Sodium     REACTION: difficulty breathing, rash, vomiting  . Sulfa Antibiotics Rash     Current Outpatient Prescriptions:  .  albuterol (PROAIR HFA) 108 (90 Base) MCG/ACT inhaler, Inhale 1 puff into the lungs as needed., Disp: 1 Inhaler, Rfl: 12 .  ALPRAZolam (XANAX) 0.5 MG tablet, TAKE 1/2 TO 1 TABLET TWICE A DAY AS NEEDED, Disp: 60 tablet, Rfl: 5 .  aspirin 81 MG tablet, Take 1 tablet by mouth daily., Disp: , Rfl:  .  Blood Glucose Monitoring Suppl (FREESTYLE LITE) DEVI, Needs a meter to check sugar once daily DX E11.9, Disp: 1 each, Rfl: 0 .  diclofenac sodium (VOLTAREN) 1 % GEL, Apply 2 g topically 4 (four) times daily., Disp: , Rfl:  .  EPINEPHrine 0.3 mg/0.3 mL IJ SOAJ injection, Inject into the muscle once., Disp: , Rfl:  .  escitalopram (LEXAPRO) 20 MG tablet, Take 1 tablet (20 mg total) by mouth daily., Disp: 90 tablet, Rfl: 1 .  esomeprazole (NEXIUM) 40 MG capsule, TAKE 1 CAPSULE (40 MG TOTAL) BY MOUTH DAILY., Disp: 90 capsule, Rfl: 3 .  fluticasone (FLONASE) 50 MCG/ACT nasal spray, Place 2 sprays into both nostrils daily., Disp: 16 g, Rfl: 5 .  glucose blood (FREESTYLE LITE) test strip, Check sugar once daily DX E11.9, freestyle lite strips, Disp: 30 each, Rfl: 12 .  Lancets (FREESTYLE) lancets, Check sugar once daily. Needs freestyle lite lancets. DX E11.9, Disp: 30 each, Rfl: 12 .  metFORMIN (GLUCOPHAGE) 1000 MG tablet, Take 1 tablet  (1,000 mg total) by mouth 2 (two) times daily with a meal. (Patient taking differently: Take 1,000 mg by mouth daily with breakfast. ), Disp: 60 tablet, Rfl: 12 .  naproxen (NAPROSYN) 500 MG tablet, Take 1 tablet (500 mg total) by mouth 2 (two) times daily as needed., Disp: 60 tablet, Rfl: 5 .  nystatin ointment (MYCOSTATIN), Apply 1 application topically 2 (two) times daily. To affected area, Disp: 60 g, Rfl: 1 .  quinapril (ACCUPRIL) 20 MG tablet, Take 1 tablet (20 mg total) by mouth daily., Disp: 90 tablet, Rfl: 12 .  simvastatin (ZOCOR) 20 MG tablet, Take 1 tablet (20 mg total) by mouth at bedtime., Disp: 30 tablet, Rfl: 11 .  Vitamin D, Ergocalciferol, (DRISDOL) 50000 units CAPS capsule, TAKE 1  CAPSULE (50,000 UNITS TOTAL) BY MOUTH EVERY 7 (SEVEN) DAYS., Disp: 12 capsule, Rfl: 3   Patient Care Team: Jerrol Banana., MD as PCP - General (Family Medicine)      Objective:   Vitals: There were no vitals taken for this visit.  There were no vitals filed for this visit.   Physical Exam  Constitutional: She is oriented to person, place, and time. She appears well-developed and well-nourished. No distress.  HENT:  Head: Normocephalic and atraumatic.  Eyes: Conjunctivae are normal. No scleral icterus.  Neck: Neck supple. No thyromegaly present.  Cardiovascular: Normal rate, regular rhythm, normal heart sounds and intact distal pulses.   Pulmonary/Chest: Effort normal and breath sounds normal. No respiratory distress. She has no wheezes. She has no rales.  Breasts: breasts appear normal, no suspicious masses, no skin or nipple changes or axillary nodes.  Abdominal: Soft. She exhibits no distension. There is no tenderness. There is no rebound and no guarding.  Genitourinary:  Genitourinary Comments: GYN:  External genitalia within normal limits.  Vaginal mucosa pink, moist, normal rugae.  Nonfriable cervix without lesions, no discharge or bleeding noted on speculum exam.  Bimanual exam  revealed normal, nongravid uterus.  No cervical motion tenderness. No adnexal masses bilaterally.    Musculoskeletal: She exhibits no edema or deformity.  Lymphadenopathy:    She has no cervical adenopathy.  Neurological: She is alert and oriented to person, place, and time.  Skin: Skin is warm and dry. No rash noted.  Psychiatric: She has a normal mood and affect. Her behavior is normal.  Vitals reviewed.    Depression Screen PHQ 2/9 Scores 09/12/2017 05/10/2017  PHQ - 2 Score 2 1  PHQ- 9 Score 7 5      Assessment & Plan:     Routine Health Maintenance and Physical Exam  Exercise Activities and Dietary recommendations Goals    None      Immunization History  Administered Date(s) Administered  . Hepatitis A 01/29/2010, 08/02/2010  . Hepatitis B 01/29/2010, 02/26/2010, 08/02/2010  . Influenza,inj,Quad PF,6+ Mos 09/18/2015, 09/22/2016  . Pneumococcal Polysaccharide-23 12/09/2011  . Tdap 12/08/2010    Health Maintenance  Topic Date Due  . FOOT EXAM  12/24/1969  . OPHTHALMOLOGY EXAM  12/24/1969  . HIV Screening  12/24/1974  . PAP SMEAR  12/08/2013  . MAMMOGRAM  07/20/2015  . PNEUMOCOCCAL POLYSACCHARIDE VACCINE (2) 12/08/2016  . INFLUENZA VACCINE  07/26/2017  . HEMOGLOBIN A1C  11/10/2017  . COLONOSCOPY  08/04/2019  . TETANUS/TDAP  12/08/2020  . Hepatitis C Screening  Completed     Discussed health benefits of physical activity, and encouraged her to engage in regular exercise appropriate for her age and condition.   Reports h/o osteoporosis - will request records from GYN  Problem List Items Addressed This Visit      Endocrine   Diabetes ([place])    Other Visit Diagnoses    Annual physical exam    -  Primary   Screening for breast cancer       Relevant Orders   MM DIGITAL SCREENING BILATERAL   Cervical cancer screening       Relevant Orders   Pap IG and HPV (high risk) DNA detection   Need for influenza vaccination       Relevant Orders   Flu Vaccine  QUAD 36+ mos IM (Completed)     Recent labs reviewed - non required today  -------------------------------------------------------------------- Has f/u in 1 month for diabetes and NASH  The entirety of the information documented in the History of Present Illness, Review of Systems and Physical Exam were personally obtained by me. Portions of this information were initially documented by Raquel Sarna Ratchford, CMA and reviewed by me for thoroughness and accuracy.     Lavon Paganini, MD  [facility]

## 2017-09-12 NOTE — Progress Notes (Signed)
Wt Readings from Last 3 Encounters:  09/12/17 159 lb 6.4 oz (72.3 kg)  08/22/17 162 lb (73.5 kg)  08/08/17 162 lb (73.5 kg)   Temp Readings from Last 3 Encounters:  09/12/17 97.8 F (36.6 C)  08/22/17 98.3 F (36.8 C) (Oral)  08/08/17 97.8 F (36.6 C) (Oral)   BP Readings from Last 3 Encounters:  09/12/17 110/70  08/22/17 140/82  08/08/17 132/84   Pulse Readings from Last 3 Encounters:  09/12/17 72  08/22/17 72  08/08/17 78

## 2017-09-12 NOTE — Patient Instructions (Signed)
Preventive Care 40-64 Years, Female Preventive care refers to lifestyle choices and visits with your health care provider that can promote health and wellness. What does preventive care include?  A yearly physical exam. This is also called an annual well check.  Dental exams once or twice a year.  Routine eye exams. Ask your health care provider how often you should have your eyes checked.  Personal lifestyle choices, including: ? Daily care of your teeth and gums. ? Regular physical activity. ? Eating a healthy diet. ? Avoiding tobacco and drug use. ? Limiting alcohol use. ? Practicing safe sex. ? Taking low-dose aspirin daily starting at age 58. ? Taking vitamin and mineral supplements as recommended by your health care provider. What happens during an annual well check? The services and screenings done by your health care provider during your annual well check will depend on your age, overall health, lifestyle risk factors, and family history of disease. Counseling Your health care provider may ask you questions about your:  Alcohol use.  Tobacco use.  Drug use.  Emotional well-being.  Home and relationship well-being.  Sexual activity.  Eating habits.  Work and work Statistician.  Method of birth control.  Menstrual cycle.  Pregnancy history.  Screening You may have the following tests or measurements:  Height, weight, and BMI.  Blood pressure.  Lipid and cholesterol levels. These may be checked every 5 years, or more frequently if you are over 81 years old.  Skin check.  Lung cancer screening. You may have this screening every year starting at age 78 if you have a 30-pack-year history of smoking and currently smoke or have quit within the past 15 years.  Fecal occult blood test (FOBT) of the stool. You may have this test every year starting at age 65.  Flexible sigmoidoscopy or colonoscopy. You may have a sigmoidoscopy every 5 years or a colonoscopy  every 10 years starting at age 30.  Hepatitis C blood test.  Hepatitis B blood test.  Sexually transmitted disease (STD) testing.  Diabetes screening. This is done by checking your blood sugar (glucose) after you have not eaten for a while (fasting). You may have this done every 1-3 years.  Mammogram. This may be done every 1-2 years. Talk to your health care provider about when you should start having regular mammograms. This may depend on whether you have a family history of breast cancer.  BRCA-related cancer screening. This may be done if you have a family history of breast, ovarian, tubal, or peritoneal cancers.  Pelvic exam and Pap test. This may be done every 3 years starting at age 80. Starting at age 36, this may be done every 5 years if you have a Pap test in combination with an HPV test.  Bone density scan. This is done to screen for osteoporosis. You may have this scan if you are at high risk for osteoporosis.  Discuss your test results, treatment options, and if necessary, the need for more tests with your health care provider. Vaccines Your health care provider may recommend certain vaccines, such as:  Influenza vaccine. This is recommended every year.  Tetanus, diphtheria, and acellular pertussis (Tdap, Td) vaccine. You may need a Td booster every 10 years.  Varicella vaccine. You may need this if you have not been vaccinated.  Zoster vaccine. You may need this after age 5.  Measles, mumps, and rubella (MMR) vaccine. You may need at least one dose of MMR if you were born in  1957 or later. You may also need a second dose.  Pneumococcal 13-valent conjugate (PCV13) vaccine. You may need this if you have certain conditions and were not previously vaccinated.  Pneumococcal polysaccharide (PPSV23) vaccine. You may need one or two doses if you smoke cigarettes or if you have certain conditions.  Meningococcal vaccine. You may need this if you have certain  conditions.  Hepatitis A vaccine. You may need this if you have certain conditions or if you travel or work in places where you may be exposed to hepatitis A.  Hepatitis B vaccine. You may need this if you have certain conditions or if you travel or work in places where you may be exposed to hepatitis B.  Haemophilus influenzae type b (Hib) vaccine. You may need this if you have certain conditions.  Talk to your health care provider about which screenings and vaccines you need and how often you need them. This information is not intended to replace advice given to you by your health care provider. Make sure you discuss any questions you have with your health care provider. Document Released: 01/08/2016 Document Revised: 08/31/2016 Document Reviewed: 10/13/2015 Elsevier Interactive Patient Education  2017 [employer].

## 2017-09-14 ENCOUNTER — Telehealth: Payer: Self-pay

## 2017-09-14 LAB — PAP IG AND HPV HIGH-RISK: HPV DNA High Risk: NOT DETECTED

## 2017-09-14 NOTE — Telephone Encounter (Signed)
-----   Message from Virginia Crews, MD sent at 09/14/2017 10:37 AM EDT ----- Pap smear was incomplete.  There were not enough cells present.  This can happen sometimes in post-menopausal women.  HPV was negative, which is good.  We will recheck pap smear next year at physical.  Brita Romp Dionne Bucy, MD, MPH [facility] 09/14/2017 10:37 AM

## 2017-09-14 NOTE — Telephone Encounter (Signed)
[email or web address]

## 2017-09-15 NOTE — Telephone Encounter (Signed)
Pt advised and agrees with treatment plan. 

## 2017-09-19 ENCOUNTER — Encounter: Payer: Self-pay | Admitting: Family Medicine

## 2017-10-03 ENCOUNTER — Telehealth: Payer: Self-pay

## 2017-10-03 NOTE — Telephone Encounter (Signed)
Noticed pt has not scheduled mammogram during chart review. Called pt to ask she schedule that appointment. Gave pt number to call to schedule mammogram.

## 2017-10-13 NOTE — Progress Notes (Signed)
Cardiology Office Note  Date:  10/16/2017   ID:  Hailey Horton, Hailey Horton 1961-11-22, MRN [id number]  PCP:  Jerrol Banana., MD   Chief Complaint  Patient presents with  . other    Heart palpitations, sob and dizziness. Meds reviewed verbally with pt.    HPI:  Hailey Horton is a very pleasant 56 year old woman with past medical history of NASH, positive hepatitis A Diabetes Hyperlipidemia Hypertension Anxiety/depression Presents by referral from Dr. Rosanna Randy for consultation of her symptoms of shortness of breath, near syncope, chest tightness, palpitations   August 4th,drove 40 min back home,  got out of the truck,  almost passed out   walking to the house  lightheaded and it caused her to fall.   She did not actually lose consciousness.    At that time she also had chest heaviness, nausea, headache and tingling in her left arm. continued to feel foggy, have heart flutters, SOB on exertion and has trouble concentrating.    Patient had cardiac work up prior to 2010 including treadmill.   She states she was unable to do the treadmill because of her back pain.  2 disks bulging, foot with a pin in place Today did pharmacologic study at that time  Recent stressors brother died  due to liver failure.   Poor sleep, wakes up, fatigue  Not very active, no regular exercise, continues to have periods of shortness of breath and chest tightness  EKG personally reviewed by myself on todays visit Shows normal sinus rhythm with rate 80 bpm nonspecific T wave abnormality   PMH:   has a past medical history of Allergy; Anxiety; Depression; Diabetes mellitus without complication ([place]); GERD (gastroesophageal reflux disease); Hyperlipidemia; and Hypertension.  PSH:    Past Surgical History:  Procedure Laterality Date  . SPINE SURGERY  03/17/10   ruptured disck in lower back  . TUBAL LIGATION    . WRIST SURGERY  1994/1979    Current Outpatient Prescriptions  Medication Sig Dispense  Refill  . albuterol (PROAIR HFA) 108 (90 Base) MCG/ACT inhaler Inhale 1 puff into the lungs as needed. 1 Inhaler 12  . ALPRAZolam (XANAX) 0.5 MG tablet TAKE 1/2 TO 1 TABLET TWICE A DAY AS NEEDED 60 tablet 5  . aspirin 81 MG tablet Take 1 tablet by mouth daily.    . Blood Glucose Monitoring Suppl (FREESTYLE LITE) DEVI Needs a meter to check sugar once daily DX E11.9 1 each 0  . diclofenac sodium (VOLTAREN) 1 % GEL Apply 2 g topically 4 (four) times daily.    Marland Kitchen EPINEPHrine 0.3 mg/0.3 mL IJ SOAJ injection Inject into the muscle once.    . escitalopram (LEXAPRO) 20 MG tablet Take 1 tablet (20 mg total) by mouth daily. 90 tablet 1  . esomeprazole (NEXIUM) 40 MG capsule TAKE 1 CAPSULE (40 MG TOTAL) BY MOUTH DAILY. 90 capsule 3  . fluticasone (FLONASE) 50 MCG/ACT nasal spray Place 2 sprays into both nostrils daily. 16 g 5  . glucose blood (FREESTYLE LITE) test strip Check sugar once daily DX E11.9, freestyle lite strips 30 each 12  . Lancets (FREESTYLE) lancets Check sugar once daily. Needs freestyle lite lancets. DX E11.9 30 each 12  . metFORMIN (GLUCOPHAGE) 1000 MG tablet Take 1 tablet (1,000 mg total) by mouth 2 (two) times daily with a meal. (Patient taking differently: Take 1,000 mg by mouth daily with breakfast. ) 60 tablet 12  . naproxen (NAPROSYN) 500 MG tablet Take 1 tablet (500 mg  total) by mouth 2 (two) times daily as needed. 60 tablet 5  . nystatin ointment (MYCOSTATIN) Apply 1 application topically 2 (two) times daily. To affected area 60 g 1  . quinapril (ACCUPRIL) 20 MG tablet Take 1 tablet (20 mg total) by mouth daily. 90 tablet 12  . simvastatin (ZOCOR) 20 MG tablet Take 1 tablet (20 mg total) by mouth at bedtime. 30 tablet 11  . Vitamin D, Ergocalciferol, (DRISDOL) 50000 units CAPS capsule TAKE 1 CAPSULE (50,000 UNITS TOTAL) BY MOUTH EVERY 7 (SEVEN) DAYS. (Patient taking differently: Take 50,000 Units by mouth every 30 (thirty) days. ) 12 capsule 3   No current facility-administered  medications for this visit.      Allergies:   Acetaminophen-codeine; Clarithromycin; Codeine phosphate; Hydrochlorothiazide; Peanuts  [peanut oil]; Penicillins; Sulfacetamide sodium; and Sulfa antibiotics   Social History:  The patient  reports that she has never smoked. She has never used smokeless tobacco. She reports that she does not drink alcohol or use drugs.   Family History:   family history includes Anemia in her mother; Arthritis in her sister; CVA in her mother; Cancer in her maternal aunt and mother; Diabetes in her sister; Epilepsy in her father; Heart disease in her mother and sister; Hypertension in her mother; Leukemia in her mother.    Review of Systems: Review of Systems  Constitutional: Negative.   Respiratory: Positive for shortness of breath.   Cardiovascular: Positive for chest pain and palpitations.  Gastrointestinal: Negative.   Musculoskeletal: Negative.   Neurological: Positive for dizziness.  Psychiatric/Behavioral: Negative.   All other systems reviewed and are negative.    PHYSICAL EXAM: VS:  BP 127/85 (BP Location: Right Arm, Patient Position: Sitting, Cuff Size: Normal)   Pulse 80   Ht 5' 4"  (1.626 m)   Wt 162 lb 8 oz (73.7 kg)   BMI 27.89 kg/m  , BMI Body mass index is 27.89 kg/m. GEN: Well nourished, well developed, in no acute distress , obese HEENT: normal  Neck: no JVD, carotid bruits, or masses Cardiac: RRR; no murmurs, rubs, or gallops,no edema  Respiratory:  clear to auscultation bilaterally, normal work of breathing GI: soft, nontender, nondistended, + BS MS: no deformity or atrophy  Skin: warm and dry, no rash Neuro:  Strength and sensation are intact Psych: euthymic mood, full affect    Recent Labs: 08/08/2017: ALT 66; BUN 22; Creatinine, Ser 0.79; Hemoglobin 13.1; Platelets 340; Potassium 5.3; Sodium 142; TSH 1.650    Lipid Panel Lab Results  Component Value Date   CHOL 191 01/10/2017   HDL 45 01/10/2017   LDLCALC 108 (H)  01/10/2017   TRIG 191 (H) 01/10/2017      Wt Readings from Last 3 Encounters:  10/16/17 162 lb 8 oz (73.7 kg)  09/12/17 159 lb 6.4 oz (72.3 kg)  08/22/17 162 lb (73.5 kg)       ASSESSMENT AND PLAN:  Type 2 diabetes mellitus without complication, without long-term current use of insulin ([place]) - Plan: EKG 12-Lead We have encouraged continued exercise, careful diet management in an effort to lose weight. Managed by Dr. Rosanna Randy  NASH (nonalcoholic steatohepatitis) She is trying to lose weight  Essential hypertension - Plan: EKG 12-Lead Blood pressure is well controlled on today's visit. No changes made to the medications. Possibly had episode of hypotension over the summer when she had her near syncope symptoms  Other hyperlipidemia - Plan: EKG 12-Lead Currently on a statin Cholesterol is at goal on the current lipid  regimen. No changes to the medications were made.  Palpitations - Plan: EKG 12-Lead Previous episodes, none recently Unable to exclude arrhythmia, or related to stress We will continue to monitor for now  Near syncope - Plan: EKG 12-Lead Etiology unclear, possibly hypovolemia in the hot summer after getting out of her car Recommended she stay hydrated  Chest pain, unspecified type - Plan: EKG 12-Lead, NM Myocar Multi W/Spect W/Wall Motion / EF Significant family history, several risk factors, unable to treadmill Pharmacologic study has been ordered  SOB (shortness of breath) - Plan: EKG 12-Lead, NM Myocar Multi W/Spect W/Wall Motion / EF Recommend she start regular walking program after her stress test Weight loss  Patient was seen in consultation for Dr. Rosanna Randy and will be referred back to his office for ongoing care of the issues detailed above  Disposition:   F/U as needed   Total encounter time more than 60 minutes  Greater than 50% was spent in counseling and coordination of care with the patient    Orders Placed This Encounter  Procedures  .  NM Myocar Multi W/Spect W/Wall Motion / EF  . EKG 12-Lead     Signed, Esmond Plants, M.D., Ph.D. 10/16/2017  [place], [place]

## 2017-10-16 ENCOUNTER — Ambulatory Visit (INDEPENDENT_AMBULATORY_CARE_PROVIDER_SITE_OTHER): Payer: BLUE CROSS/BLUE SHIELD | Admitting: Cardiovascular Disease

## 2017-10-16 ENCOUNTER — Encounter: Payer: Self-pay | Admitting: Cardiovascular Disease

## 2017-10-16 VITALS — BP 127/85 | HR 80 | Ht 64.0 in | Wt 162.5 lb

## 2017-10-16 DIAGNOSIS — I10 Essential (primary) hypertension: Secondary | ICD-10-CM

## 2017-10-16 DIAGNOSIS — R002 Palpitations: Secondary | ICD-10-CM

## 2017-10-16 DIAGNOSIS — R0602 Shortness of breath: Secondary | ICD-10-CM

## 2017-10-16 DIAGNOSIS — R55 Syncope and collapse: Secondary | ICD-10-CM | POA: Diagnosis not present

## 2017-10-16 DIAGNOSIS — R079 Chest pain, unspecified: Secondary | ICD-10-CM

## 2017-10-16 DIAGNOSIS — E7849 Other hyperlipidemia: Secondary | ICD-10-CM

## 2017-10-16 DIAGNOSIS — K7581 Nonalcoholic steatohepatitis (NASH): Secondary | ICD-10-CM | POA: Diagnosis not present

## 2017-10-16 DIAGNOSIS — E119 Type 2 diabetes mellitus without complications: Secondary | ICD-10-CM

## 2017-10-16 NOTE — Patient Instructions (Addendum)
Medication Instructions:   No medication changes made  Labwork:  No new labs needed  Testing/Procedures:  We will schedule a lexiscan myioview for chest pain, lightheaded, SOB [facility] MYOVIEW  Your caregiver has ordered a Stress Test with nuclear imaging. The purpose of this test is to evaluate the blood supply to your heart muscle. This procedure is referred to as a "Non-Invasive Stress Test." This is because other than having an IV started in your vein, nothing is inserted or "invades" your body. Cardiac stress tests are done to find areas of poor blood flow to the heart by determining the extent of coronary artery disease (CAD). Some patients exercise on a treadmill, which naturally increases the blood flow to your heart, while others who are  unable to walk on a treadmill due to physical limitations have a pharmacologic/chemical stress agent called Lexiscan . This medicine will mimic walking on a treadmill by temporarily increasing your coronary blood flow.   Please note: these test may take anywhere between 2-4 hours to complete  PLEASE REPORT TO [place] AT THE FIRST DESK WILL DIRECT YOU WHERE TO GO  Date of Procedure:__Wednesday Oct. 31st____  Arrival Time for Procedure:_07:15AM___  Instructions regarding medication:   __X_ : Hold diabetes medication morning of procedure    PLEASE NOTIFY THE OFFICE AT LEAST 24 HOURS IN ADVANCE IF YOU ARE UNABLE TO KEEP YOUR APPOINTMENT.  [phone] AND  PLEASE NOTIFY NUCLEAR MEDICINE AT [facility] AT LEAST 24 HOURS IN ADVANCE IF YOU ARE UNABLE TO KEEP YOUR APPOINTMENT. [phone]  How to prepare for your Myoview test:  1. Do not eat or drink after midnight 2. No caffeine for 24 hours prior to test 3. No smoking 24 hours prior to test. 4. Your medication may be taken with water.  If your doctor stopped a medication because of this test, do not take that medication. 5. Ladies, please do not wear dresses.   Skirts or pants are appropriate. Please wear a short sleeve shirt. 6. No perfume, cologne or lotion. 7. Wear comfortable walking shoes. No heels!    Follow-Up: It was a pleasure seeing you in the office today. Please call us if you have new issues that need to be addressed before your next appt.  [phone]  Your physician wants you to follow-up in: As needed  If you need a refill on your cardiac medications before your next appointment, please call your pharmacy.

## 2017-10-17 ENCOUNTER — Ambulatory Visit
Admission: RE | Admit: 2017-10-17 | Discharge: 2017-10-17 | Disposition: A | Discharge status: Home / Self Care | Payer: BLUE CROSS/BLUE SHIELD | Source: Ambulatory Visit | Attending: Family Medicine | Admitting: Family Medicine

## 2017-10-17 DIAGNOSIS — Z1239 Encounter for other screening for malignant neoplasm of breast: Secondary | ICD-10-CM

## 2017-10-17 DIAGNOSIS — Z1231 Encounter for screening mammogram for malignant neoplasm of breast: Secondary | ICD-10-CM | POA: Diagnosis not present

## 2017-10-19 ENCOUNTER — Ambulatory Visit: Payer: Self-pay | Admitting: Family Medicine

## 2017-10-25 ENCOUNTER — Ambulatory Visit
Admission: RE | Admit: 2017-10-25 | Discharge: 2017-10-25 | Disposition: A | Discharge status: Home / Self Care | Payer: BLUE CROSS/BLUE SHIELD | Source: Ambulatory Visit | Attending: Cardiovascular Disease | Admitting: Cardiovascular Disease

## 2017-10-25 DIAGNOSIS — R079 Chest pain, unspecified: Secondary | ICD-10-CM

## 2017-10-25 DIAGNOSIS — R0602 Shortness of breath: Secondary | ICD-10-CM | POA: Diagnosis not present

## 2017-10-25 MED ORDER — REGADENOSON 0.4 MG/5ML IV SOLN
0.4000 mg | Freq: Once | INTRAVENOUS | Status: AC
  Administered 2017-10-25: 0.4 mg via INTRAVENOUS

## 2017-10-25 MED ORDER — TECHNETIUM TC 99M TETROFOSMIN IV KIT
32.9190 | PACK | Freq: Once | INTRAVENOUS | Status: AC | PRN
  Administered 2017-10-25: 32.919 via INTRAVENOUS

## 2017-10-25 MED ORDER — TECHNETIUM TC 99M TETROFOSMIN IV KIT
13.0000 | PACK | Freq: Once | INTRAVENOUS | Status: AC | PRN
  Administered 2017-10-25: 13.092 via INTRAVENOUS

## 2017-10-26 LAB — NM MYOCAR MULTI W/SPECT W/WALL MOTION / EF
CHL CUP NUCLEAR SDS: 0
CHL CUP STRESS STAGE 1 GRADE: 0 %
CHL CUP STRESS STAGE 1 HR: 69 {beats}/min
CHL CUP STRESS STAGE 1 SPEED: 0 mph
CHL CUP STRESS STAGE 2 HR: 69 {beats}/min
CHL CUP STRESS STAGE 3 SPEED: 0 mph
CHL CUP STRESS STAGE 4 GRADE: 0 %
CHL CUP STRESS STAGE 4 HR: 94 {beats}/min
CHL CUP STRESS STAGE 4 SPEED: 0 mph
CHL CUP STRESS STAGE 5 DBP: 72 mmHg
CSEPPHR: 95 {beats}/min
Estimated workload: 1 METS
LVDIAVOL: 59 mL (ref 46–106)
LVSYSVOL: 12 mL
NUC STRESS TID: 1.03
Percent HR: 60 %
Percent of predicted max HR: 58 %
Rest HR: 67 {beats}/min
SRS: 0
SSS: 0
Stage 2 Grade: 0 %
Stage 2 Speed: 0 mph
Stage 3 Grade: 0 %
Stage 3 HR: 95 {beats}/min
Stage 5 Grade: 0 %
Stage 5 HR: 88 {beats}/min
Stage 5 SBP: 117 mmHg
Stage 5 Speed: 0 mph

## 2017-10-31 ENCOUNTER — Encounter: Payer: Self-pay | Admitting: Family Medicine

## 2017-10-31 ENCOUNTER — Ambulatory Visit (INDEPENDENT_AMBULATORY_CARE_PROVIDER_SITE_OTHER): Payer: BLUE CROSS/BLUE SHIELD | Admitting: Family Medicine

## 2017-10-31 ENCOUNTER — Telehealth: Payer: Self-pay

## 2017-10-31 DIAGNOSIS — E119 Type 2 diabetes mellitus without complications: Secondary | ICD-10-CM

## 2017-10-31 DIAGNOSIS — Z23 Encounter for immunization: Secondary | ICD-10-CM

## 2017-10-31 LAB — POCT GLYCOSYLATED HEMOGLOBIN (HGB A1C): HEMOGLOBIN A1C: 7

## 2017-10-31 MED ORDER — METFORMIN HCL 1000 MG PO TABS: 1000.0000 mg | ORAL_TABLET | Freq: Every day | ORAL | 12 refills | Status: DC

## 2017-10-31 NOTE — Progress Notes (Signed)
Patient: Hailey Horton Female    DOB: 1961-04-06   56 y.o.   MRN: [id number] Visit Date: 10/31/2017  Today's Provider: Wilhemena Durie, MD   Chief Complaint  Patient presents with  . Diabetes   Subjective:    HPI   Diabetes Mellitus Type II, Follow-up:   Lab Results  Component Value Date   HGBA1C 7.6 05/10/2017   HGBA1C 7.3 (H) 01/10/2017   HGBA1C 7.2 09/28/2016    Last seen for diabetes 4 months ago.  Management since then includes none. She reports good compliance with treatment. She is not having side effects.  Home blood sugar records: 120-130's  Episodes of hypoglycemia? no   Current Insulin Regimen: n/a Most Recent Eye Exam: 06/2016 Current exercise: none  Pertinent Labs:    Component Value Date/Time   CHOL 191 01/10/2017 1151   TRIG 191 (H) 01/10/2017 1151   HDL 45 01/10/2017 1151   LDLCALC 108 (H) 01/10/2017 1151   CREATININE 0.79 08/08/2017 1519    Wt Readings from Last 3 Encounters:  10/31/17 162 lb (73.5 kg)  10/16/17 162 lb 8 oz (73.7 kg)  09/12/17 159 lb 6.4 oz (72.3 kg)   ------------------------------------------------------------------------   Pt is here to discuss her stress test results. She saw Dr. Rockey Situ. His note read " No ischemia, normal ejection fraction, low risk scan. Stress test was done on 10/25/17.     Allergies  Allergen Reactions  . Acetaminophen-Codeine   . Clarithromycin     GI upset  . Codeine Phosphate     REACTION: difficulty breathing; vomiting  . Hydrochlorothiazide   . Peanuts  [Peanut Oil] Swelling  . Penicillins     REACTION: difficulty breathing; rash; vomiting  . Sulfacetamide Sodium     REACTION: difficulty breathing, rash, vomiting  . Sulfa Antibiotics Rash     Current Outpatient Medications:  .  albuterol (PROAIR HFA) 108 (90 Base) MCG/ACT inhaler, Inhale 1 puff into the lungs as needed., Disp: 1 Inhaler, Rfl: 12 .  ALPRAZolam (XANAX) 0.5 MG tablet, TAKE 1/2 TO 1 TABLET TWICE A DAY  AS NEEDED, Disp: 60 tablet, Rfl: 5 .  aspirin 81 MG tablet, Take 1 tablet by mouth daily., Disp: , Rfl:  .  Blood Glucose Monitoring Suppl (FREESTYLE LITE) DEVI, Needs a meter to check sugar once daily DX E11.9, Disp: 1 each, Rfl: 0 .  diclofenac sodium (VOLTAREN) 1 % GEL, Apply 2 g topically 4 (four) times daily., Disp: , Rfl:  .  EPINEPHrine 0.3 mg/0.3 mL IJ SOAJ injection, Inject into the muscle once., Disp: , Rfl:  .  escitalopram (LEXAPRO) 20 MG tablet, Take 1 tablet (20 mg total) by mouth daily., Disp: 90 tablet, Rfl: 1 .  esomeprazole (NEXIUM) 40 MG capsule, TAKE 1 CAPSULE (40 MG TOTAL) BY MOUTH DAILY., Disp: 90 capsule, Rfl: 3 .  fluticasone (FLONASE) 50 MCG/ACT nasal spray, Place 2 sprays into both nostrils daily., Disp: 16 g, Rfl: 5 .  glucose blood (FREESTYLE LITE) test strip, Check sugar once daily DX E11.9, freestyle lite strips, Disp: 30 each, Rfl: 12 .  Lancets (FREESTYLE) lancets, Check sugar once daily. Needs freestyle lite lancets. DX E11.9, Disp: 30 each, Rfl: 12 .  quinapril (ACCUPRIL) 20 MG tablet, Take 1 tablet (20 mg total) by mouth daily., Disp: 90 tablet, Rfl: 12 .  simvastatin (ZOCOR) 20 MG tablet, Take 1 tablet (20 mg total) by mouth at bedtime., Disp: 30 tablet, Rfl: 11 .  Vitamin D,  Ergocalciferol, (DRISDOL) 50000 units CAPS capsule, TAKE 1 CAPSULE (50,000 UNITS TOTAL) BY MOUTH EVERY 7 (SEVEN) DAYS. (Patient taking differently: Take 50,000 Units by mouth every 30 (thirty) days. ), Disp: 12 capsule, Rfl: 3 .  metFORMIN (GLUCOPHAGE) 1000 MG tablet, Take 1 tablet (1,000 mg total) daily with breakfast by mouth., Disp: 30 tablet, Rfl: 12 .  naproxen (NAPROSYN) 500 MG tablet, Take 1 tablet (500 mg total) by mouth 2 (two) times daily as needed. (Patient not taking: Reported on 10/31/2017), Disp: 60 tablet, Rfl: 5 .  nystatin ointment (MYCOSTATIN), Apply 1 application topically 2 (two) times daily. To affected area, Disp: 60 g, Rfl: 1  Review of Systems  Constitutional:  Negative.   HENT: Positive for mouth sores (sore gums).   Eyes: Negative.   Respiratory: Negative.   Cardiovascular: Negative.   Gastrointestinal: Negative.   Endocrine: Negative.   Genitourinary: Negative.   Musculoskeletal: Negative.   Skin: Negative.   Allergic/Immunologic: Negative.   Neurological: Positive for tremors.  Hematological: Negative.   Psychiatric/Behavioral: Negative.     Social History   Tobacco Use  . Smoking status: Never Smoker  . Smokeless tobacco: Never Used  Substance Use Topics  . Alcohol use: No   Objective:   BP 110/78 (BP Location: Right Arm, Patient Position: Sitting, Cuff Size: Normal)   Pulse 80   Temp 98.4 F (36.9 C) (Oral)   Resp 16   Wt 162 lb (73.5 kg)   LMP 10/25/2009 (Approximate) Comment: tubal ligation  BMI 27.81 kg/m  Vitals:   10/31/17 1457  BP: 110/78  Pulse: 80  Resp: 16  Temp: 98.4 F (36.9 C)  TempSrc: Oral  Weight: 162 lb (73.5 kg)     Physical Exam  Constitutional: She is oriented to person, place, and time. She appears well-developed and well-nourished.  HENT:  Head: Normocephalic and atraumatic.  Eyes: Conjunctivae and EOM are normal. Pupils are equal, round, and reactive to light. No scleral icterus.  Neck: Normal range of motion. Neck supple. No thyromegaly present.  Cardiovascular: Normal rate, regular rhythm, normal heart sounds and intact distal pulses.  Pulmonary/Chest: Effort normal and breath sounds normal.  Abdominal: Soft.  Musculoskeletal: Normal range of motion.  Neurological: She is alert and oriented to person, place, and time. She has normal reflexes.  Skin: Skin is warm and dry.  Psychiatric: She has a normal mood and affect. Her behavior is normal. Judgment and thought content normal.   Diabetic Foot Exam - Simple   Simple Foot Form Diabetic Foot exam was performed with the following findings:  Yes 10/31/2017  3:41 PM  Visual Inspection No deformities, no ulcerations, no other skin  breakdown bilaterally:  Yes Sensation Testing Intact to touch and monofilament testing bilaterally:  Yes Pulse Check Posterior Tibialis and Dorsalis pulse intact bilaterally:  Yes Comments         Assessment & Plan:     1. Type 2 diabetes mellitus without complication, without long-term current use of insulin ([facility]) A1c 7.0 today. Stable. Continue current medications and follow up in 1 month. - metFORMIN (GLUCOPHAGE) 1000 MG tablet; Take 1 tablet (1,000 mg total) daily with breakfast by mouth.  Dispense: 30 tablet; Refill: 12 - POCT HgB A1C - Pneumococcal polysaccharide vaccine 23-valent greater than or equal to 2yo subcutaneous/IM 2.MDD      HPI, Exam, and A&P Transcribed under the direction and in the presence of Jameeka Marcy L. Cranford Mon, MD  Electronically Signed: Katina Dung, [place],  MD  [place]

## 2017-10-31 NOTE — Telephone Encounter (Signed)
-----   Message from Virginia Crews, MD sent at 10/30/2017  4:47 PM EST ----- Normal mammogram  Bac[place]al[place], Dionne Bucy, MD, MPH [facility] 10/30/2017 4:47 PM

## 2017-10-31 NOTE — Telephone Encounter (Signed)
[email or web address]

## 2017-11-07 NOTE — Telephone Encounter (Signed)
Pt advised.

## 2017-11-07 NOTE — Telephone Encounter (Signed)
[email or web address]

## 2017-12-01 ENCOUNTER — Other Ambulatory Visit: Payer: Self-pay | Admitting: Family Medicine

## 2017-12-06 ENCOUNTER — Other Ambulatory Visit: Payer: Self-pay | Admitting: Family Medicine

## 2017-12-06 DIAGNOSIS — K219 Gastro-esophageal reflux disease without esophagitis: Secondary | ICD-10-CM

## 2018-02-26 DIAGNOSIS — H2513 Age-related nuclear cataract, bilateral: Secondary | ICD-10-CM | POA: Diagnosis not present

## 2018-02-26 DIAGNOSIS — E089 Diabetes mellitus due to underlying condition without complications: Secondary | ICD-10-CM | POA: Diagnosis not present

## 2018-02-26 DIAGNOSIS — Q828 Other specified congenital malformations of skin: Secondary | ICD-10-CM | POA: Diagnosis not present

## 2018-02-26 DIAGNOSIS — E119 Type 2 diabetes mellitus without complications: Secondary | ICD-10-CM | POA: Diagnosis not present

## 2018-02-26 LAB — HM DIABETES EYE EXAM

## 2018-02-28 ENCOUNTER — Ambulatory Visit: Payer: Self-pay | Admitting: Family Medicine

## 2018-03-04 ENCOUNTER — Other Ambulatory Visit: Payer: Self-pay | Admitting: Family Medicine

## 2018-03-06 ENCOUNTER — Encounter: Payer: Self-pay | Admitting: Family Medicine

## 2018-03-06 ENCOUNTER — Ambulatory Visit: Payer: BLUE CROSS/BLUE SHIELD | Admitting: Family Medicine

## 2018-03-06 VITALS — BP 112/62 | HR 95 | Temp 98.1°F | Resp 18

## 2018-03-06 DIAGNOSIS — J40 Bronchitis, not specified as acute or chronic: Secondary | ICD-10-CM

## 2018-03-06 DIAGNOSIS — H109 Unspecified conjunctivitis: Secondary | ICD-10-CM | POA: Diagnosis not present

## 2018-03-06 DIAGNOSIS — H6692 Otitis media, unspecified, left ear: Secondary | ICD-10-CM | POA: Diagnosis not present

## 2018-03-06 DIAGNOSIS — E119 Type 2 diabetes mellitus without complications: Secondary | ICD-10-CM

## 2018-03-06 DIAGNOSIS — F3340 Major depressive disorder, recurrent, in remission, unspecified: Secondary | ICD-10-CM

## 2018-03-06 LAB — POCT UA - MICROALBUMIN: MICROALBUMIN (UR) POC: 50 mg/L

## 2018-03-06 LAB — POCT GLYCOSYLATED HEMOGLOBIN (HGB A1C): Hemoglobin A1C: 7

## 2018-03-06 MED ORDER — CIPROFLOXACIN HCL 0.3 % OP SOLN: 2.0000 [drp] | OPHTHALMIC | 0 refills | Status: DC

## 2018-03-06 MED ORDER — AZITHROMYCIN 250 MG PO TABS: ORAL_TABLET | ORAL | 0 refills | Status: DC

## 2018-03-06 NOTE — Progress Notes (Signed)
Patient: Hailey Horton Female    DOB: Dec 14, 1961   57 y.o.   MRN: [id number] Visit Date: 03/06/2018  Today's Provider: Wilhemena Durie, MD   Chief Complaint  Patient presents with  . Diabetes  . Cough   Subjective:    HPI  Diabetes Mellitus Type II, Follow-up:   Lab Results  Component Value Date   HGBA1C 7.0 10/31/2017   HGBA1C 7.6 05/10/2017   HGBA1C 7.3 (H) 01/10/2017    Last seen for diabetes 4 months ago.  Management since then includes none. She reports good compliance with treatment. She is not having side effects.  Home blood sugar records: 120's  Episodes of hypoglycemia? yes - "once in a while"   Most Recent Eye Exam: [facility] a week ago [facility] Current exercise: none  Pertinent Labs:    Component Value Date/Time   CHOL 191 01/10/2017 1151   TRIG 191 (H) 01/10/2017 1151   HDL 45 01/10/2017 1151   LDLCALC 108 (H) 01/10/2017 1151   CREATININE 0.79 08/08/2017 1519    Wt Readings from Last 3 Encounters:  10/31/17 162 lb (73.5 kg)  10/16/17 162 lb 8 oz (73.7 kg)  09/12/17 159 lb 6.4 oz (72.3 kg)    ------------------------------------------------------------------------ Pt reports that she started coughing on 02/21/18 and 4 days later started feeling bad. She started running a fever last night of 100.6 and this am of 99. She woke up this morning with her eye matted shut and burning. She reports that she has bilateral ear pain and feels dizzy. She also has some shortness of breath. She has sinus congestion and headache. She reports that she starts to feel worse at night and cough worse at night.      Allergies  Allergen Reactions  . Acetaminophen-Codeine   . Clarithromycin     GI upset  . Codeine Phosphate     REACTION: difficulty breathing; vomiting  . Hydrochlorothiazide   . Peanuts  [Peanut Oil] Swelling  . Penicillins     REACTION: difficulty breathing; rash; vomiting  . Sulfacetamide Sodium     REACTION: difficulty  breathing, rash, vomiting  . Sulfa Antibiotics Rash     Current Outpatient Medications:  .  albuterol (PROVENTIL HFA;VENTOLIN HFA) 108 (90 Base) MCG/ACT inhaler, 1 puff daily as needed, Disp: 1 Inhaler, Rfl: 0 .  ALPRAZolam (XANAX) 0.5 MG tablet, TAKE 1/2 TO 1 TABLET TWICE A DAY AS NEEDED, Disp: 60 tablet, Rfl: 5 .  aspirin 81 MG tablet, Take 1 tablet by mouth daily., Disp: , Rfl:  .  Blood Glucose Monitoring Suppl (FREESTYLE LITE) DEVI, Needs a meter to check sugar once daily DX E11.9, Disp: 1 each, Rfl: 0 .  diclofenac sodium (VOLTAREN) 1 % GEL, Apply 2 g topically 4 (four) times daily., Disp: , Rfl:  .  EPINEPHrine 0.3 mg/0.3 mL IJ SOAJ injection, Inject into the muscle once., Disp: , Rfl:  .  escitalopram (LEXAPRO) 20 MG tablet, Take 1 tablet (20 mg total) by mouth daily., Disp: 90 tablet, Rfl: 1 .  esomeprazole (NEXIUM) 40 MG capsule, TAKE 1 CAPSULE (40 MG TOTAL) BY MOUTH DAILY., Disp: 90 capsule, Rfl: 3 .  fluticasone (FLONASE) 50 MCG/ACT nasal spray, Place 2 sprays into both nostrils daily., Disp: 16 g, Rfl: 5 .  glucose blood (FREESTYLE LITE) test strip, Check sugar once daily DX E11.9, freestyle lite strips, Disp: 30 each, Rfl: 12 .  Lancets (FREESTYLE) lancets, Check sugar once daily. Needs  freestyle lite lancets. DX E11.9, Disp: 30 each, Rfl: 12 .  Multiple Vitamin (MULTIVITAMIN) tablet, Take 1 tablet by mouth daily., Disp: , Rfl:  .  quinapril (ACCUPRIL) 20 MG tablet, Take 1 tablet (20 mg total) by mouth daily., Disp: 90 tablet, Rfl: 12 .  simvastatin (ZOCOR) 20 MG tablet, TAKE 1 TABLET BY MOUTH AT BEDTIME, Disp: 30 tablet, Rfl: 0 .  vitamin C (ASCORBIC ACID) 250 MG tablet, Take 250 mg by mouth daily., Disp: , Rfl:  .  Vitamin D, Ergocalciferol, (DRISDOL) 50000 units CAPS capsule, TAKE 1 CAPSULE (50,000 UNITS TOTAL) BY MOUTH EVERY 7 (SEVEN) DAYS. (Patient taking differently: Take 50,000 Units by mouth every 30 (thirty) days. ), Disp: 12 capsule, Rfl: 3 .  metFORMIN (GLUCOPHAGE) 1000  MG tablet, Take 1 tablet (1,000 mg total) daily with breakfast by mouth., Disp: 30 tablet, Rfl: 12 .  naproxen (NAPROSYN) 500 MG tablet, Take 1 tablet (500 mg total) by mouth 2 (two) times daily as needed. (Patient not taking: Reported on 10/31/2017), Disp: 60 tablet, Rfl: 5 .  nystatin ointment (MYCOSTATIN), Apply 1 application topically 2 (two) times daily. To affected area, Disp: 60 g, Rfl: 1  Review of Systems  Constitutional: Positive for chills, fatigue and fever.  HENT: Positive for congestion, ear pain, postnasal drip, rhinorrhea, sinus pressure, sinus pain and sore throat.   Eyes: Positive for discharge and redness.  Respiratory: Positive for cough and shortness of breath.   Cardiovascular: Negative.   Gastrointestinal: Positive for nausea.  Endocrine: Negative.   Genitourinary: Negative.   Musculoskeletal: Negative.   Skin: Negative.   Allergic/Immunologic: Negative.   Neurological: Positive for dizziness and light-headedness.  Hematological: Negative.   Psychiatric/Behavioral: Negative.     Social History   Tobacco Use  . Smoking status: Never Smoker  . Smokeless tobacco: Never Used  Substance Use Topics  . Alcohol use: No   Objective:   BP 112/62 (BP Location: Left Arm, Patient Position: Sitting, Cuff Size: Normal)   Pulse 95   Temp 98.1 F (36.7 C) (Oral)   Resp 18   LMP 10/25/2009 (Approximate) Comment: tubal ligation  SpO2 97%  Vitals:   03/06/18 1507  BP: 112/62  Pulse: 95  Resp: 18  Temp: 98.1 F (36.7 C)  TempSrc: Oral  SpO2: 97%     Physical Exam  Constitutional: She is oriented to person, place, and time. She appears well-developed and well-nourished.  HENT:  Head: Normocephalic and atraumatic.  Right Ear: External ear normal.  Left Ear: External ear normal.  Nose: Nose normal.  Mouth/Throat: Oropharynx is clear and moist.  Eyes: Conjunctivae are normal.  Neck: Neck supple. No thyromegaly present.  Cardiovascular: Normal rate, regular  rhythm and normal heart sounds.  Pulmonary/Chest: Effort normal and breath sounds normal.  Abdominal: Soft.  Lymphadenopathy:    She has no cervical adenopathy.  Neurological: She is alert and oriented to person, place, and time.  Skin: Skin is warm and dry.  Psychiatric: She has a normal mood and affect. Her behavior is normal. Judgment and thought content normal.        Assessment & Plan:     1. Type 2 diabetes mellitus without complication, without long-term current use of insulin (HCC)  - POCT HgB A1C--7.0 - POCT UA - Microalbumin--  2. Left otitis media, unspecified otitis media type  - azithromycin (ZITHROMAX) 250 MG tablet; Take 2 tablets on the first day then one daily until finished.  Dispense: 6 each; Refill: 0  3.  Conjunctivitis of right eye, unspecified conjunctivitis type  - ciprofloxacin (CILOXAN) 0.3 % ophthalmic solution; Place 2 drops into the right eye every 2 (two) hours. Used as directed below  Dispense: 5 mL; Refill: 0  4. Bronchitis  - azithromycin (ZITHROMAX) 250 MG tablet; Take 2 tablets on the first day then one daily until finished.  Dispense: 6 each; Refill: 0 5.MDD  I have done the exam and reviewed the chart and it is accurate to the best of my knowledge. Development worker, community has been used and  any errors in dictation or transcription are unintentional. Miguel Aschoff M.D. [place], MD  [facility]

## 2018-03-22 DIAGNOSIS — D492 Neoplasm of unspecified behavior of bone, soft tissue, and skin: Secondary | ICD-10-CM | POA: Diagnosis not present

## 2018-03-22 DIAGNOSIS — B078 Other viral warts: Secondary | ICD-10-CM | POA: Diagnosis not present

## 2018-03-28 ENCOUNTER — Encounter: Payer: Self-pay | Admitting: Family Medicine

## 2018-05-15 ENCOUNTER — Ambulatory Visit: Payer: BLUE CROSS/BLUE SHIELD | Admitting: Physician Assistant

## 2018-05-15 ENCOUNTER — Other Ambulatory Visit: Payer: Self-pay

## 2018-05-15 ENCOUNTER — Encounter: Payer: Self-pay | Admitting: Physician Assistant

## 2018-05-15 VITALS — BP 134/84 | HR 84 | Temp 98.3°F | Resp 16 | Wt 158.0 lb

## 2018-05-15 DIAGNOSIS — W57XXXA Bitten or stung by nonvenomous insect and other nonvenomous arthropods, initial encounter: Secondary | ICD-10-CM | POA: Diagnosis not present

## 2018-05-15 DIAGNOSIS — R21 Rash and other nonspecific skin eruption: Secondary | ICD-10-CM | POA: Diagnosis not present

## 2018-05-15 DIAGNOSIS — F419 Anxiety disorder, unspecified: Secondary | ICD-10-CM

## 2018-05-15 DIAGNOSIS — R5383 Other fatigue: Secondary | ICD-10-CM

## 2018-05-15 MED ORDER — GLUCOSE BLOOD VI STRP: ORAL_STRIP | 12 refills | Status: DC

## 2018-05-15 MED ORDER — ALPRAZOLAM 0.5 MG PO TABS: ORAL_TABLET | ORAL | 5 refills | Status: DC

## 2018-05-15 MED ORDER — DOXYCYCLINE HYCLATE 100 MG PO TABS: 100.0000 mg | ORAL_TABLET | Freq: Two times a day (BID) | ORAL | 0 refills | Status: AC

## 2018-05-15 NOTE — Progress Notes (Signed)
Patient: Hailey Horton Female    DOB: [birth year]   57 y.o.   MRN: [id number] Visit Date: 05/15/2018  Today's Provider: Trinna Post, PA-C   Chief Complaint  Patient presents with  . Insect Bite    Possible tick bites.   Subjective:    HPI  Meghana Tullo is a 57 y/o woman with history of tick bites. She reports she was walking through tall grass about one week ago in shorts and pulled multiple ticks off her. Unable to identify how long ticks were attached or type of tick. She does not recall ever being infected with tick borne illness. She reports feeling some intermittent nausea and fatigue, denies myalgias, fevers, rash.     Allergies  Allergen Reactions  . Acetaminophen-Codeine   . Clarithromycin     GI upset  . Codeine Phosphate     REACTION: difficulty breathing; vomiting  . Hydrochlorothiazide   . Peanuts  [Peanut Oil] Swelling  . Penicillins     REACTION: difficulty breathing; rash; vomiting  . Sulfacetamide Sodium     REACTION: difficulty breathing, rash, vomiting  . Sulfa Antibiotics Rash     Current Outpatient Medications:  .  albuterol (PROVENTIL HFA;VENTOLIN HFA) 108 (90 Base) MCG/ACT inhaler, 1 puff daily as needed, Disp: 1 Inhaler, Rfl: 0 .  ALPRAZolam (XANAX) 0.5 MG tablet, TAKE 1/2 TO 1 TABLET TWICE A DAY AS NEEDED, Disp: 60 tablet, Rfl: 5 .  aspirin 81 MG tablet, Take 1 tablet by mouth daily., Disp: , Rfl:  .  azithromycin (ZITHROMAX) 250 MG tablet, Take 2 tablets on the first day then one daily until finished., Disp: 6 each, Rfl: 0 .  Blood Glucose Monitoring Suppl (FREESTYLE LITE) DEVI, Needs a meter to check sugar once daily DX E11.9, Disp: 1 each, Rfl: 0 .  diclofenac sodium (VOLTAREN) 1 % GEL, Apply 2 g topically 4 (four) times daily., Disp: , Rfl:  .  EPINEPHrine 0.3 mg/0.3 mL IJ SOAJ injection, Inject into the muscle once., Disp: , Rfl:  .  escitalopram (LEXAPRO) 20 MG tablet, Take 1 tablet (20 mg total) by mouth daily., Disp: 90  tablet, Rfl: 1 .  esomeprazole (NEXIUM) 40 MG capsule, TAKE 1 CAPSULE (40 MG TOTAL) BY MOUTH DAILY., Disp: 90 capsule, Rfl: 3 .  fluticasone (FLONASE) 50 MCG/ACT nasal spray, Place 2 sprays into both nostrils daily., Disp: 16 g, Rfl: 5 .  glucose blood (FREESTYLE LITE) test strip, Check sugar once daily DX E11.9, freestyle lite strips, Disp: 30 each, Rfl: 12 .  Lancets (FREESTYLE) lancets, Check sugar once daily. Needs freestyle lite lancets. DX E11.9, Disp: 30 each, Rfl: 12 .  metFORMIN (GLUCOPHAGE) 1000 MG tablet, Take 1 tablet (1,000 mg total) daily with breakfast by mouth., Disp: 30 tablet, Rfl: 12 .  Multiple Vitamin (MULTIVITAMIN) tablet, Take 1 tablet by mouth daily., Disp: , Rfl:  .  naproxen (NAPROSYN) 500 MG tablet, Take 1 tablet (500 mg total) by mouth 2 (two) times daily as needed., Disp: 60 tablet, Rfl: 5 .  nystatin ointment (MYCOSTATIN), Apply 1 application topically 2 (two) times daily. To affected area, Disp: 60 g, Rfl: 1 .  quinapril (ACCUPRIL) 20 MG tablet, Take 1 tablet (20 mg total) by mouth daily., Disp: 90 tablet, Rfl: 12 .  simvastatin (ZOCOR) 20 MG tablet, TAKE 1 TABLET BY MOUTH AT BEDTIME, Disp: 30 tablet, Rfl: 0 .  vitamin C (ASCORBIC ACID) 250 MG tablet, Take 250 mg by mouth daily.,  Disp: , Rfl:  .  Vitamin D, Ergocalciferol, (DRISDOL) 50000 units CAPS capsule, TAKE 1 CAPSULE (50,000 UNITS TOTAL) BY MOUTH EVERY 7 (SEVEN) DAYS. (Patient taking differently: Take 50,000 Units by mouth every 30 (thirty) days. ), Disp: 12 capsule, Rfl: 3  Review of Systems  Constitutional: Negative.   Gastrointestinal: Positive for nausea. Negative for abdominal distention, abdominal pain, anal bleeding, blood in stool, constipation, diarrhea, rectal pain and vomiting.  Skin: Positive for rash. Negative for color change, pallor and wound.  Neurological: Positive for headaches. Negative for dizziness and light-headedness.    Social History   Tobacco Use  . Smoking status: Never Smoker    . Smokeless tobacco: Never Used  Substance Use Topics  . Alcohol use: No   Objective:   BP 134/84 (BP Location: Right Arm, Patient Position: Sitting, Cuff Size: Normal)   Pulse 84   Temp 98.3 F (36.8 C) (Oral)   Resp 16   Wt 158 lb (71.7 kg)   LMP 10/25/2009 (Approximate) Comment: tubal ligation  BMI 27.12 kg/m  Vitals:   05/15/18 0940  BP: 134/84  Pulse: 84  Resp: 16  Temp: 98.3 F (36.8 C)  TempSrc: Oral  Weight: 158 lb (71.7 kg)     Physical Exam  Constitutional: She is oriented to person, place, and time. She appears well-developed and well-nourished.  Cardiovascular: Normal rate and regular rhythm.  Pulmonary/Chest: Effort normal and breath sounds normal.  Neurological: She is alert and oriented to person, place, and time.  Skin: Skin is warm and dry. There is erythema.  Erythematous macule on on right lower quadrant of abdomen, left flank, groin area. No other rash apparent.  Psychiatric: She has a normal mood and affect. Her behavior is normal.        Assessment & Plan:     1. Tick bite, initial encounter  Symptoms nonspecific, however patient does have multiple tick bites and is out of the window for prophylaxis. Will treat as below.  - Lyme Ab/Western Blot Reflex - Rocky mtn spotted fvr abs pnl(IgG+IgM) - doxycycline (VIBRA-TABS) 100 MG tablet; Take 1 tablet (100 mg total) by mouth 2 (two) times daily for 7 days.  Dispense: 14 tablet; Refill: 0  2. Rash  May use hydrocortisone cream for itching on lesions.  3. Other fatigue  Return if symptoms worsen or fail to improve.  The entirety of the information documented in the History of Present Illness, Review of Systems and Physical Exam were personally obtained by me. Portions of this information were initially documented by Ashley Royalty, CMA and reviewed by me for thoroughness and accuracy.          Trinna Post, PA-C  [facility]

## 2018-05-15 NOTE — Patient Instructions (Signed)

## 2018-05-18 ENCOUNTER — Telehealth: Payer: Self-pay

## 2018-05-18 LAB — LYME AB/WESTERN BLOT REFLEX
LYME DISEASE AB, QUANT, IGM: <0.8 index (ref 0.00–0.79)
Lyme IgG/IgM Ab: 1.28 {ISR} — ABNORMAL HIGH (ref 0.00–0.90)

## 2018-05-18 LAB — LYME, WESTERN BLOT, SERUM (REFLEXED)
IgG P18 Ab.: ABSENT
IgG P23 Ab.: ABSENT
IgG P28 Ab.: ABSENT
IgG P30 Ab.: ABSENT
IgG P39 Ab.: ABSENT
IgG P41 Ab.: ABSENT
IgG P45 Ab.: ABSENT
IgG P58 Ab.: ABSENT
IgG P66 Ab.: ABSENT
IgG P93 Ab.: ABSENT
IgM P39 Ab.: ABSENT
IgM P41 Ab.: ABSENT
Lyme IgG Wb: NEGATIVE
Lyme IgM Wb: NEGATIVE

## 2018-05-18 LAB — ROCKY MTN SPOTTED FVR ABS PNL(IGG+IGM)
RMSF IgG: POSITIVE — AB
RMSF IgM: 0.21 index (ref 0.00–0.89)

## 2018-05-18 LAB — RMSF, IGG, IFA: RMSF, IGG, IFA: 1:128 {titer} — ABNORMAL HIGH

## 2018-05-18 NOTE — Telephone Encounter (Signed)
Patient advised of lab results

## 2018-06-29 ENCOUNTER — Other Ambulatory Visit: Payer: Self-pay | Admitting: Family Medicine

## 2018-07-09 ENCOUNTER — Ambulatory Visit: Payer: Self-pay | Admitting: Family Medicine

## 2018-08-07 DIAGNOSIS — Z8049 Family history of malignant neoplasm of other genital organs: Secondary | ICD-10-CM | POA: Diagnosis not present

## 2018-08-07 DIAGNOSIS — Z803 Family history of malignant neoplasm of breast: Secondary | ICD-10-CM | POA: Diagnosis not present

## 2018-08-07 DIAGNOSIS — Z1231 Encounter for screening mammogram for malignant neoplasm of breast: Secondary | ICD-10-CM | POA: Diagnosis not present

## 2018-08-07 DIAGNOSIS — M8588 Other specified disorders of bone density and structure, other site: Secondary | ICD-10-CM | POA: Diagnosis not present

## 2018-08-07 DIAGNOSIS — E119 Type 2 diabetes mellitus without complications: Secondary | ICD-10-CM | POA: Diagnosis not present

## 2018-08-07 DIAGNOSIS — E559 Vitamin D deficiency, unspecified: Secondary | ICD-10-CM | POA: Diagnosis not present

## 2018-08-07 DIAGNOSIS — Z13820 Encounter for screening for osteoporosis: Secondary | ICD-10-CM | POA: Diagnosis not present

## 2018-08-07 DIAGNOSIS — R634 Abnormal weight loss: Secondary | ICD-10-CM | POA: Diagnosis not present

## 2018-08-07 DIAGNOSIS — Z6828 Body mass index (BMI) 28.0-28.9, adult: Secondary | ICD-10-CM | POA: Diagnosis not present

## 2018-08-07 DIAGNOSIS — Z01419 Encounter for gynecological examination (general) (routine) without abnormal findings: Secondary | ICD-10-CM | POA: Diagnosis not present

## 2018-08-07 DIAGNOSIS — R5383 Other fatigue: Secondary | ICD-10-CM | POA: Diagnosis not present

## 2018-08-07 DIAGNOSIS — D649 Anemia, unspecified: Secondary | ICD-10-CM | POA: Diagnosis not present

## 2018-08-07 DIAGNOSIS — Z800 Family history of malignant neoplasm of digestive organs: Secondary | ICD-10-CM | POA: Diagnosis not present

## 2018-08-07 DIAGNOSIS — N958 Other specified menopausal and perimenopausal disorders: Secondary | ICD-10-CM | POA: Diagnosis not present

## 2018-08-18 ENCOUNTER — Other Ambulatory Visit: Payer: Self-pay | Admitting: Family Medicine

## 2018-08-18 DIAGNOSIS — F329 Major depressive disorder, single episode, unspecified: Secondary | ICD-10-CM

## 2018-08-18 DIAGNOSIS — I10 Essential (primary) hypertension: Secondary | ICD-10-CM

## 2018-08-18 DIAGNOSIS — F32A Depression, unspecified: Secondary | ICD-10-CM

## 2018-09-13 ENCOUNTER — Ambulatory Visit (INDEPENDENT_AMBULATORY_CARE_PROVIDER_SITE_OTHER): Payer: BLUE CROSS/BLUE SHIELD | Admitting: Family Medicine

## 2018-09-13 ENCOUNTER — Encounter: Payer: Self-pay | Admitting: Family Medicine

## 2018-09-13 VITALS — BP 124/62 | HR 76 | Temp 97.6°F | Resp 16 | Ht 62.5 in | Wt 157.0 lb

## 2018-09-13 DIAGNOSIS — I10 Essential (primary) hypertension: Secondary | ICD-10-CM

## 2018-09-13 DIAGNOSIS — F329 Major depressive disorder, single episode, unspecified: Secondary | ICD-10-CM | POA: Diagnosis not present

## 2018-09-13 DIAGNOSIS — E119 Type 2 diabetes mellitus without complications: Secondary | ICD-10-CM

## 2018-09-13 DIAGNOSIS — S0300XA Dislocation of jaw, unspecified side, initial encounter: Secondary | ICD-10-CM

## 2018-09-13 DIAGNOSIS — Z23 Encounter for immunization: Secondary | ICD-10-CM | POA: Diagnosis not present

## 2018-09-13 DIAGNOSIS — E7849 Other hyperlipidemia: Secondary | ICD-10-CM

## 2018-09-13 DIAGNOSIS — F32A Depression, unspecified: Secondary | ICD-10-CM

## 2018-09-13 LAB — POCT GLYCOSYLATED HEMOGLOBIN (HGB A1C): Hemoglobin A1C: 7.3 % — AB (ref 4.0–5.6)

## 2018-09-13 NOTE — Progress Notes (Signed)
Patient: Hailey Horton Female    DOB: 17-Feb-1961   57 y.o.   MRN: [id number] Visit Date: 09/13/2018  Today's Provider: Wilhemena Durie, MD   Chief Complaint  Patient presents with  . Diabetes  . Hypertension  . Neck Pain   Subjective:    HPI  Diabetes Mellitus Type II, Follow-up:   Lab Results  Component Value Date   HGBA1C 7.3 (A) 09/13/2018   HGBA1C 7.0 03/06/2018   HGBA1C 7.0 10/31/2017    Last seen for diabetes 5 months ago.  Management since then includes no changes. She reports good compliance with treatment. She is not having side effects.  Current symptoms include none and have been stable. Home blood sugar records: not being checked. Episodes of hypoglycemia? no   Current Insulin Regimen: none Most Recent Eye Exam: about 6 months ago.  Weight trend: stable Prior visit with dietician: no Current diet: well balanced Current exercise: no regular exercise  Pertinent Labs:    Component Value Date/Time   CHOL 191 01/10/2017 1151   TRIG 191 (H) 01/10/2017 1151   HDL 45 01/10/2017 1151   LDLCALC 108 (H) 01/10/2017 1151   CREATININE 0.79 08/08/2017 1519    Wt Readings from Last 3 Encounters:  09/13/18 157 lb (71.2 kg)  05/15/18 158 lb (71.7 kg)  10/31/17 162 lb (73.5 kg)    Hypertension, follow-up:  BP Readings from Last 3 Encounters:  09/13/18 124/62  05/15/18 134/84  03/06/18 112/62    She was last seen for hypertension 5 months ago.  BP at that visit was 112/62. Management since that visit includes no changes. She reports good compliance with treatment. She is not having side effects.  She is not exercising. She is adherent to low salt diet.   Outside blood pressures are checked occasionally. She is experiencing none.  Patient denies exertional chest pressure/discomfort, lower extremity edema and palpitations.   Cardiovascular risk factors include diabetes mellitus.   Neck Pain Patient reports that she has had pain on the  right side of her neck X 1 week. She denies any injuries. She has used Aleve with no relief.     Allergies  Allergen Reactions  . Acetaminophen-Codeine   . Clarithromycin     GI upset  . Codeine Phosphate     REACTION: difficulty breathing; vomiting  . Hydrochlorothiazide   . Peanuts  [Peanut Oil] Swelling  . Penicillins     REACTION: difficulty breathing; rash; vomiting  . Sulfacetamide Sodium     REACTION: difficulty breathing, rash, vomiting  . Sulfa Antibiotics Rash     Current Outpatient Medications:  .  albuterol (PROVENTIL HFA;VENTOLIN HFA) 108 (90 Base) MCG/ACT inhaler, 1 puff daily as needed, Disp: 1 Inhaler, Rfl: 0 .  ALPRAZolam (XANAX) 0.5 MG tablet, TAKE 1/2 TO 1 TABLET TWICE A DAY AS NEEDED, Disp: 60 tablet, Rfl: 5 .  aspirin 81 MG tablet, Take 1 tablet by mouth daily., Disp: , Rfl:  .  Blood Glucose Monitoring Suppl (FREESTYLE LITE) DEVI, Needs a meter to check sugar once daily DX E11.9, Disp: 1 each, Rfl: 0 .  diclofenac sodium (VOLTAREN) 1 % GEL, Apply 2 g topically 4 (four) times daily., Disp: , Rfl:  .  EPINEPHrine 0.3 mg/0.3 mL IJ SOAJ injection, Inject into the muscle once., Disp: , Rfl:  .  escitalopram (LEXAPRO) 20 MG tablet, Take 1 tablet (20 mg total) by mouth daily., Disp: 90 tablet, Rfl: 1 .  esomeprazole ([place])  40 MG capsule, TAKE 1 CAPSULE (40 MG TOTAL) BY MOUTH DAILY., Disp: 90 capsule, Rfl: 3 .  fluticasone (FLONASE) 50 MCG/ACT nasal spray, Place 2 sprays into both nostrils daily., Disp: 16 g, Rfl: 5 .  glucose blood (FREESTYLE LITE) test strip, Check sugar once daily DX E11.9, freestyle lite strips, Disp: 30 each, Rfl: 12 .  glucose blood (ONE TOUCH ULTRA TEST) test strip, Use as instructed, Disp: 100 each, Rfl: 12 .  Lancets (FREESTYLE) lancets, Check sugar once daily. Needs freestyle lite lancets. DX E11.9, Disp: 30 each, Rfl: 12 .  metFORMIN (GLUCOPHAGE) 1000 MG tablet, Take 1 tablet (1,000 mg total) daily with breakfast by mouth., Disp: 30 tablet,  Rfl: 12 .  Multiple Vitamin (MULTIVITAMIN) tablet, Take 1 tablet by mouth daily., Disp: , Rfl:  .  naproxen (NAPROSYN) 500 MG tablet, Take 1 tablet (500 mg total) by mouth 2 (two) times daily as needed., Disp: 60 tablet, Rfl: 5 .  nystatin ointment (MYCOSTATIN), Apply 1 application topically 2 (two) times daily. To affected area, Disp: 60 g, Rfl: 1 .  quinapril (ACCUPRIL) 20 MG tablet, TAKE 1 TABLET BY MOUTH EVERY DAY, Disp: 90 tablet, Rfl: 3 .  simvastatin (ZOCOR) 20 MG tablet, TAKE 1 TABLET BY MOUTH EVERYDAY AT BEDTIME, Disp: 30 tablet, Rfl: 11 .  vitamin C (ASCORBIC ACID) 250 MG tablet, Take 250 mg by mouth daily., Disp: , Rfl:  .  Vitamin D, Ergocalciferol, (DRISDOL) 50000 units CAPS capsule, TAKE 1 CAPSULE (50,000 UNITS TOTAL) BY MOUTH EVERY 7 (SEVEN) DAYS. (Patient taking differently: Take 50,000 Units by mouth every 30 (thirty) days. ), Disp: 12 capsule, Rfl: 3 .  azithromycin (ZITHROMAX) 250 MG tablet, Take 2 tablets on the first day then one daily until finished. (Patient not taking: Reported on 09/13/2018), Disp: 6 each, Rfl: 0 .  escitalopram (LEXAPRO) 20 MG tablet, TAKE 1 TABLET (20 MG TOTAL) BY MOUTH DAILY. (Patient not taking: Reported on 09/13/2018), Disp: 90 tablet, Rfl: 3  Review of Systems  Constitutional: Negative for activity change, appetite change, chills, diaphoresis, fatigue, fever and unexpected weight change.  HENT: Negative.   Eyes: Negative.   Respiratory: Negative for cough and shortness of breath.   Cardiovascular: Negative for chest pain, palpitations and leg swelling.  Endocrine: Negative for cold intolerance, heat intolerance, polydipsia, polyphagia and polyuria.  Musculoskeletal: Positive for arthralgias, myalgias, neck pain and neck stiffness.  Allergic/Immunologic: Negative for environmental allergies.  Neurological: Negative for dizziness, light-headedness and headaches.  Psychiatric/Behavioral: Negative.     Social History   Tobacco Use  . Smoking  status: Never Smoker  . Smokeless tobacco: Never Used  Substance Use Topics  . Alcohol use: No   Objective:   BP 124/62 (BP Location: Left Arm, Patient Position: Sitting, Cuff Size: Normal)   Pulse 76   Temp 97.6 F (36.4 C)   Resp 16   Ht 5' 2.5" (1.588 m)   Wt 157 lb (71.2 kg)   LMP 10/25/2009 (Approximate) Comment: tubal ligation  SpO2 97%   BMI 28.26 kg/m  Vitals:   09/13/18 1015  BP: 124/62  Pulse: 76  Resp: 16  Temp: 97.6 F (36.4 C)  SpO2: 97%  Weight: 157 lb (71.2 kg)  Height: 5' 2.5" (1.588 m)     Physical Exam  Constitutional: She is oriented to person, place, and time. She appears well-developed and well-nourished.  HENT:  Head: Normocephalic and atraumatic.  Eyes: Conjunctivae are normal. No scleral icterus.  Neck: No thyromegaly present.  Cardiovascular:  Normal rate, regular rhythm and normal heart sounds.  Pulmonary/Chest: Effort normal and breath sounds normal.  Abdominal: Soft.  Musculoskeletal: She exhibits no edema.  Lymphadenopathy:    She has no cervical adenopathy.  Neurological: She is alert and oriented to person, place, and time.  Skin: Skin is warm and dry.  Psychiatric: She has a normal mood and affect. Her behavior is normal. Judgment and thought content normal.        Assessment & Plan:     1. Type 2 diabetes mellitus without complication, without long-term current use of insulin (HCC) Good control. Goal of less than 7. - POCT glycosylated hemoglobin (Hb A1C)--7.3 today.  2. Depression, unspecified depression type  - CBC with Differential/Platelet  3. Essential hypertension  - Comprehensive metabolic panel  4. Flu vaccine need  - Flu Vaccine QUAD 6+ mos PF IM (Fluarix Quad PF)  5. Dislocation of temporomandibular joint, initial encounter Aleve BID for 2 weeks.  6. Other hyperlipidemia - Lipid panel - TSH      I have done the exam and reviewed the above chart and it is accurate to the best of my knowledge. Risk manager has been used in this note in any air is in the dictation or transcription are unintentional.  Wilhemena Durie, MD  [place]

## 2018-09-13 NOTE — Patient Instructions (Signed)
Take Aleve twice daily for the next 2 weeks to help with neck pain.

## 2018-09-17 DIAGNOSIS — F329 Major depressive disorder, single episode, unspecified: Secondary | ICD-10-CM | POA: Diagnosis not present

## 2018-09-17 DIAGNOSIS — E7849 Other hyperlipidemia: Secondary | ICD-10-CM | POA: Diagnosis not present

## 2018-09-17 DIAGNOSIS — I10 Essential (primary) hypertension: Secondary | ICD-10-CM | POA: Diagnosis not present

## 2018-09-18 DIAGNOSIS — Z1329 Encounter for screening for other suspected endocrine disorder: Secondary | ICD-10-CM | POA: Diagnosis not present

## 2018-09-18 DIAGNOSIS — N9089 Other specified noninflammatory disorders of vulva and perineum: Secondary | ICD-10-CM | POA: Diagnosis not present

## 2018-09-18 DIAGNOSIS — E559 Vitamin D deficiency, unspecified: Secondary | ICD-10-CM | POA: Diagnosis not present

## 2018-09-18 DIAGNOSIS — N901 Moderate vulvar dysplasia: Secondary | ICD-10-CM | POA: Diagnosis not present

## 2018-09-18 LAB — LIPID PANEL
Chol/HDL Ratio: 3.8 ratio (ref 0.0–4.4)
Cholesterol, Total: 173 mg/dL (ref 100–199)
HDL: 45 mg/dL
LDL Calculated: 102 mg/dL — ABNORMAL HIGH (ref 0–99)
Triglycerides: 131 mg/dL (ref 0–149)
VLDL Cholesterol Cal: 26 mg/dL (ref 5–40)

## 2018-09-18 LAB — COMPREHENSIVE METABOLIC PANEL WITH GFR
ALT: 42 IU/L — ABNORMAL HIGH (ref 0–32)
AST: 26 IU/L (ref 0–40)
Albumin/Globulin Ratio: 1.7 (ref 1.2–2.2)
Albumin: 4 g/dL (ref 3.5–5.5)
Alkaline Phosphatase: 111 IU/L (ref 39–117)
BUN/Creatinine Ratio: 30 — ABNORMAL HIGH (ref 9–23)
BUN: 24 mg/dL (ref 6–24)
Bilirubin Total: <0.2 mg/dL (ref 0.0–1.2)
CO2: 23 mmol/L (ref 20–29)
Calcium: 9.4 mg/dL (ref 8.7–10.2)
Chloride: 104 mmol/L (ref 96–106)
Creatinine, Ser: 0.79 mg/dL (ref 0.57–1.00)
GFR calc Af Amer: 95 mL/min/1.73
GFR calc non Af Amer: 83 mL/min/1.73
Globulin, Total: 2.4 g/dL (ref 1.5–4.5)
Glucose: 124 mg/dL — ABNORMAL HIGH (ref 65–99)
Potassium: 4.5 mmol/L (ref 3.5–5.2)
Sodium: 140 mmol/L (ref 134–144)
Total Protein: 6.4 g/dL (ref 6.0–8.5)

## 2018-09-18 LAB — CBC WITH DIFFERENTIAL/PLATELET
Basophils Absolute: 0.1 x10E3/uL (ref 0.0–0.2)
Basos: 1 %
EOS (ABSOLUTE): 0.2 x10E3/uL (ref 0.0–0.4)
Eos: 4 %
Hematocrit: 34.7 % (ref 34.0–46.6)
Hemoglobin: 11.1 g/dL (ref 11.1–15.9)
Immature Grans (Abs): 0 x10E3/uL (ref 0.0–0.1)
Immature Granulocytes: 0 %
Lymphocytes Absolute: 2.2 x10E3/uL (ref 0.7–3.1)
Lymphs: 34 %
MCH: 26.3 pg — ABNORMAL LOW (ref 26.6–33.0)
MCHC: 32 g/dL (ref 31.5–35.7)
MCV: 82 fL (ref 79–97)
Monocytes Absolute: 0.4 x10E3/uL (ref 0.1–0.9)
Monocytes: 6 %
Neutrophils Absolute: 3.5 x10E3/uL (ref 1.4–7.0)
Neutrophils: 55 %
Platelets: 297 x10E3/uL (ref 150–450)
RBC: 4.22 x10E6/uL (ref 3.77–5.28)
RDW: 14.8 % (ref 12.3–15.4)
WBC: 6.4 x10E3/uL (ref 3.4–10.8)

## 2018-09-18 LAB — TSH: TSH: 3.78 u[IU]/mL (ref 0.450–4.500)

## 2018-09-19 ENCOUNTER — Telehealth: Payer: Self-pay

## 2018-09-19 NOTE — Telephone Encounter (Signed)
-----   Message from Jerrol Banana., MD sent at 09/19/2018  1:44 PM EDT ----- Stable.

## 2018-09-19 NOTE — Telephone Encounter (Signed)
Patient has been advised. KW 

## 2018-09-24 ENCOUNTER — Telehealth: Payer: Self-pay | Admitting: *Deleted

## 2018-09-24 NOTE — Telephone Encounter (Signed)
Called and spoke with the patient regarding the new appt. Patient given the date/time for 10/2 at 10am.

## 2018-09-26 ENCOUNTER — Inpatient Hospital Stay: Payer: BLUE CROSS/BLUE SHIELD | Attending: Obstetrics | Admitting: Obstetrics

## 2018-09-26 ENCOUNTER — Encounter: Payer: Self-pay | Admitting: Obstetrics

## 2018-09-26 VITALS — BP 158/94 | HR 87 | Temp 98.0°F | Resp 20 | Ht 62.5 in | Wt 156.3 lb

## 2018-09-26 DIAGNOSIS — D071 Carcinoma in situ of vulva: Secondary | ICD-10-CM | POA: Insufficient documentation

## 2018-09-26 DIAGNOSIS — N900 Mild vulvar dysplasia: Secondary | ICD-10-CM | POA: Diagnosis not present

## 2018-09-26 DIAGNOSIS — Z9889 Other specified postprocedural states: Secondary | ICD-10-CM | POA: Diagnosis not present

## 2018-09-26 DIAGNOSIS — N903 Dysplasia of vulva, unspecified: Secondary | ICD-10-CM

## 2018-09-26 NOTE — H&P (View-Only) (Signed)
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   GYN Oncologic Summary 1. N/A o .  HPI: Ms. Hailey Horton  is a very nice 57 y.o.  P0  She went for her annual exam 07/2018 and Dr.McComb noted a white lesion on her perineum. 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  She was therefore referred for management and recommendations.  In retrospect she does admit to some irritation in the region where it would burn with urination and occasionally she would notice a pruritis.  Imported EPIC Oncologic History:   No history exists.    Measurement of disease: . N/A  Radiology: . No imaging in Epic relevant to referral  Outpatient Encounter Medications as of 09/26/2018  Medication Sig  . albuterol (PROVENTIL HFA;VENTOLIN HFA) 108 (90 Base) MCG/ACT inhaler 1 puff daily as needed  . ALPRAZolam (XANAX) 0.5 MG tablet TAKE 1/2 TO 1 TABLET TWICE A DAY AS NEEDED  . aspirin 81 MG tablet Take 1 tablet by mouth daily.  . diclofenac sodium (VOLTAREN) 1 % GEL Apply 2 g topically as needed.   Marland Kitchen EPINEPHrine 0.3 mg/0.3 mL IJ SOAJ injection Inject into the muscle once.  . escitalopram (LEXAPRO) 20 MG tablet TAKE 1 TABLET (20 MG TOTAL) BY MOUTH DAILY.  Marland Kitchen esomeprazole (NEXIUM) 40 MG capsule TAKE 1 CAPSULE (40 MG TOTAL) BY MOUTH DAILY.  . metFORMIN (GLUCOPHAGE) 1000 MG tablet Take 1 tablet (1,000 mg total) daily with breakfast by mouth.  . Multiple Vitamin (MULTIVITAMIN) tablet Take 1 tablet by mouth as needed.   . nystatin ointment (MYCOSTATIN) Apply 1 application topically 2 (two) times daily. To affected area (Patient taking differently: Apply 1 application topically as needed. To affected area)  . quinapril (ACCUPRIL) 20 MG tablet TAKE 1 TABLET BY MOUTH EVERY DAY  . simvastatin (ZOCOR) 20 MG tablet TAKE 1 TABLET BY MOUTH EVERYDAY AT  BEDTIME  . Vitamin D, Ergocalciferol, (DRISDOL) 50000 units CAPS capsule TAKE 1 CAPSULE (50,000 UNITS TOTAL) BY MOUTH EVERY 7 (SEVEN) DAYS. (Patient taking differently: Take 50,000 Units by mouth every 30 (thirty) days. )  . [DISCONTINUED] escitalopram (LEXAPRO) 20 MG tablet Take 1 tablet (20 mg total) by mouth daily.  . [DISCONTINUED] azithromycin (ZITHROMAX) 250 MG tablet Take 2 tablets on the first day then one daily until finished. (Patient not taking: Reported on 09/13/2018)  . [DISCONTINUED] Blood Glucose Monitoring Suppl (FREESTYLE LITE) DEVI Needs a meter to check sugar once daily DX E11.9 (Patient not taking: Reported on 09/26/2018)  . [DISCONTINUED] fluticasone (FLONASE) 50 MCG/ACT nasal spray Place 2 sprays into both nostrils daily. (Patient not taking: Reported on 09/26/2018)  . [DISCONTINUED] glucose blood (FREESTYLE LITE) test strip Check sugar once daily DX E11.9, freestyle lite strips (Patient not taking: Reported on 09/26/2018)  . [DISCONTINUED] glucose blood (ONE TOUCH ULTRA TEST) test strip Use as instructed (Patient not taking: Reported on 09/26/2018)  . [DISCONTINUED] Lancets (FREESTYLE) lancets Check sugar once daily. Needs freestyle lite lancets. DX E11.9 (Patient not taking: Reported on 09/26/2018)  . [DISCONTINUED] naproxen (NAPROSYN) 500 MG tablet Take 1 tablet (500 mg total) by mouth 2 (two) times daily as needed. (Patient not taking: Reported on 09/26/2018)  . [DISCONTINUED] vitamin C (ASCORBIC ACID) 250 MG tablet Take 250 mg by mouth daily.   No facility-administered encounter medications on file as of  09/26/2018.    Allergies  Allergen Reactions  . Acetaminophen-Codeine   . Clarithromycin     GI upset  . Codeine Phosphate     REACTION: difficulty breathing; vomiting  . Hydrochlorothiazide   . Peanuts  [Peanut Oil] Swelling  . Penicillins     REACTION: difficulty breathing; rash; vomiting  . Sulfacetamide Sodium     REACTION: difficulty breathing, rash, vomiting  .  Sulfa Antibiotics Rash    Past Medical History:  Diagnosis Date  . Allergy   . Anxiety   . Depression   . Diabetes mellitus without complication ([place])   . GERD (gastroesophageal reflux disease)   . Hyperlipidemia   . Hypertension    Past Surgical History:  Procedure Laterality Date  . Right Toe Surgery     Patient states between 2013 and 2014  . TUBAL LIGATION    . WRIST SURGERY  1994/1979        Past Gynecological History:   GYNECOLOGIC HISTORY:  . Patient's last menstrual period was 10/25/2009 (approximate).  . Menarche: [age] years old . P 0020 . Contraceptive prior . HRT none  . Last Pap one week ago. not available to [place]. Had abnormal paps in 1983 Family Hx:  Family History  Problem Relation Age of Onset  . Cancer Mother   . Heart disease Mother   . Hypertension Mother   . Anemia Mother   . Leukemia Mother   . CVA Mother   . Epilepsy Father   . Heart attack Father   . Diabetes Sister   . Heart disease Sister   . Arthritis Sister   . Cancer Maternal Aunt        breast  . Breast cancer Maternal Aunt    Social Hx:  Marland Kitchen Tobacco use: none . Alcohol use: none . Illicit Drug use: none . Illicit IV Drug use: none    Review of Systems: Review of Systems  Constitutional: Positive for fatigue.  Genitourinary: Positive for dyspareunia.   Musculoskeletal: Positive for arthralgias and back pain.  Psychiatric/Behavioral: The patient is nervous/anxious.     Vitals: Last menstrual period 10/25/2009. Vitals:   09/26/18 1024  Weight: 156 lb 4.8 oz (70.9 kg)  Height: 5' 2.5" (1.588 m)    Vitals:   09/26/18 1024  BP: (!) 158/94  Pulse: 87  Resp: 20  Temp: 98 F (36.7 C)  SpO2: 97%   Body mass index is 28.13 kg/m.  Physical Exam: General :  Overweight. Well developed, 57 y.o., female in no apparent distress HEENT:  Normocephalic/atraumatic, symmetric, EOMI, eyelids normal Neck:   Supple, no masses.  Lymphatics:  No cervical/ submandibular/  supraclavicular/ infraclavicular/ inguinal adenopathy Respiratory:  Respirations unlabored, no use of accessory muscles CV:   Deferred Breast:  Deferred Musculoskeletal: No CVA tenderness, normal muscle strength. Abdomen:  Soft, non-tender and nondistended. No evidence of hernia. No masses. Extremities:  No lymphedema, no erythema, non-tender. Skin:   Normal inspection Neuro/Psych:  No focal motor deficit, no abnormal mental status. Normal gait. Normal affect. Alert and oriented to person, place, and time  Genito Urinary: Vulva: External female genitalia notable for area of white abnormality at site of biopsy 7:00 and then additional tissue involved not in excision site traveling medially/internally towards the midline introitus. ~1cm total including the prior excision site Bladder/urethra: Urethral meatus normal in size and location. No lesions or   masses, well supported bladder Speculum exam: deferred given recent biopsy Bimanual exam: deferred given recent biopsy Rectovaginal:  Deferred  Assessment  Vulvar dysplasia ECOG PERFORMANCE STATUS: 0 - Asymptomatic  Plan  1. Data reviewed ? No images available or relevant for review ? I reviewed her referring doctor's office notes and I have summarized in the HPI ? History was obtained from the chart and the patient 2. We discussed the diagnosis and the grade 2-3 aspect indicating surgical excision ? She agrees to proceeding to excision 3. 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 Avoid nicotine exposure, even if second hand discussed. 5. Postoperative care was reviewed. 6. Schedule partial vulvectomy (7:00) 7. I will see her back in 10-14 days for pathology review and wound check.   Isabel Caprice, MD  09/26/2018, 10:47 AM    Cc: Arvella Nigh, MD (Referring Ob/Gyn) Jerrol Banana., MD (PCP)

## 2018-09-26 NOTE — Patient Instructions (Signed)
Plan to have a partial vulvectomy at the [facility] on October 09, 2018.  You will receive a phone call from the pre-surgical RN to discuss instructions.  Please call for any questions or concerns.   Vulvectomy  Vulvectomy is a surgical procedure to remove all or part of the outer female genital organs (vulva). The vulva includes the outer and inner lips of the vagina and the clitoris. You may need this surgery if you have a cancerous growth in your vulva. There are two types of vulvectomy:  A simple vulvectomy. This is the removal of the entire vulva.  A radical vulvectomy. A radical vulvectomy can be partial or complete. ? A partial radical vulvectomy is when part of the vulva and surrounding deep tissue is removed. ? A complete radical vulvectomy is when the vulva, clitoris, and surrounding deep tissue is removed.  During a radical vulvectomy, some lymph nodes near the vulva may also be removed. Tell a health care provider about:  Any allergies you have.  All medicines you are taking, including vitamins, herbs, eye drops, creams, and over-the-counter medicines.  Any problems you or family members have had with anesthetic medicines.  Any blood disorders you have.  Any surgeries you have had.  Any medical conditions you have.  Whether you are pregnant or may be pregnant. What are the risks? Generally, this is a safe procedure. However, problems may occur, including:  Infection.  Bleeding.  Allergic reactions to medicines.  Damage to other structures or organs.  Urinary tract infections.  Lymphedema. This is when your legs swell after the removal of lymph nodes from your groin area.  Pain or decreased sexual pleasure when having sex.  Long-term vaginal swelling, tightness, numbness, or pain.  A blood clot that may travel to the lung (pulmonary embolism).  What happens before the procedure?  Follow instructions from your health care  provider about eating or drinking restrictions.  Ask your health care provider about: ? Changing or stopping your regular medicines. This is especially important if you are taking diabetes medicines or blood thinners. ? Taking medicines such as aspirin and ibuprofen. These medicines can thin your blood. Do not take these medicines before your procedure if your health care provider instructs you not to.  Ask your health care provider how your surgical site will be marked or identified.  You may be given antibiotic medicine to help prevent infection.  Plan to have someone take you home after the procedure.  If you will be going home right after the procedure, plan to have someone with you for 24 hours. What happens during the procedure?  To reduce your risk of infection: ? Your health care team will wash or sanitize their hands. ? Your skin will be washed with soap.  An IV tube will be inserted into one of your veins.  You will be given one or more of the following: ? A medicine to help you relax (sedative). ? A medicine to make you fall asleep (general anesthetic). ? A medicine that is injected into your spine to numb the area below and slightly above the injection site (spinal anesthetic).  A tube (catheter) may be inserted through the outer opening of your bladder (urethra) to drain urine during and after surgery.  Depending on the type of vulvectomy you are having, your surgeon will make an incision and remove the affected area. This may include: ? Removing the entire vulva. ? Removing part of the vulva, surrounding  deep tissue, and lymph nodes. ? Removing the vulva, clitoris, surrounding deep tissue, and lymph nodes.  If your lymph nodes are removed, a drain may be placed in the area to help avoid fluid buildup.  Your incisions will be closed and covered with a bandage (dressing). The procedure may vary among health care providers and hospitals. What happens after the  procedure?  Your blood pressure, heart rate, breathing rate, and blood oxygen level will be monitored often until the medicines you were given have worn off.  You will get medicine for pain as needed.  You may get medicine to prevent constipation.  You may be on a liquid diet at first, and then switch to a regular diet.  When you are taking fluids well, your IV will be removed.  If your catheter was left in place after surgery, it will be removed when your health care provider approves.  You will be asked to breathe deeply and to get out of bed and walk as soon as you can. This information is not intended to replace advice given to you by your health care provider. Make sure you discuss any questions you have with your health care provider. Document Released: 01/08/2016 Document Revised: 05/19/2016 Document Reviewed: 12/07/2015 Elsevier Interactive Patient Education  [employer].

## 2018-09-26 NOTE — Progress Notes (Signed)
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   GYN Oncologic Summary 1. N/A o .  HPI: Ms. Hailey Horton  is a very nice 57 y.o.  P0  She went for her annual exam 07/2018 and Dr.McComb noted a white lesion on her perineum. 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  She was therefore referred for management and recommendations.  In retrospect she does admit to some irritation in the region where it would burn with urination and occasionally she would notice a pruritis.  Imported EPIC Oncologic History:   No history exists.    Measurement of disease: . N/A  Radiology: . No imaging in Epic relevant to referral  Outpatient Encounter Medications as of 09/26/2018  Medication Sig  . albuterol (PROVENTIL HFA;VENTOLIN HFA) 108 (90 Base) MCG/ACT inhaler 1 puff daily as needed  . ALPRAZolam (XANAX) 0.5 MG tablet TAKE 1/2 TO 1 TABLET TWICE A DAY AS NEEDED  . aspirin 81 MG tablet Take 1 tablet by mouth daily.  . diclofenac sodium (VOLTAREN) 1 % GEL Apply 2 g topically as needed.   Marland Kitchen EPINEPHrine 0.3 mg/0.3 mL IJ SOAJ injection Inject into the muscle once.  . escitalopram (LEXAPRO) 20 MG tablet TAKE 1 TABLET (20 MG TOTAL) BY MOUTH DAILY.  Marland Kitchen esomeprazole (NEXIUM) 40 MG capsule TAKE 1 CAPSULE (40 MG TOTAL) BY MOUTH DAILY.  . metFORMIN (GLUCOPHAGE) 1000 MG tablet Take 1 tablet (1,000 mg total) daily with breakfast by mouth.  . Multiple Vitamin (MULTIVITAMIN) tablet Take 1 tablet by mouth as needed.   . nystatin ointment (MYCOSTATIN) Apply 1 application topically 2 (two) times daily. To affected area (Patient taking differently: Apply 1 application topically as needed. To affected area)  . quinapril (ACCUPRIL) 20 MG tablet TAKE 1 TABLET BY MOUTH EVERY DAY  . simvastatin (ZOCOR) 20 MG tablet TAKE 1 TABLET BY MOUTH EVERYDAY AT  BEDTIME  . Vitamin D, Ergocalciferol, (DRISDOL) 50000 units CAPS capsule TAKE 1 CAPSULE (50,000 UNITS TOTAL) BY MOUTH EVERY 7 (SEVEN) DAYS. (Patient taking differently: Take 50,000 Units by mouth every 30 (thirty) days. )  . [DISCONTINUED] escitalopram (LEXAPRO) 20 MG tablet Take 1 tablet (20 mg total) by mouth daily.  . [DISCONTINUED] azithromycin (ZITHROMAX) 250 MG tablet Take 2 tablets on the first day then one daily until finished. (Patient not taking: Reported on 09/13/2018)  . [DISCONTINUED] Blood Glucose Monitoring Suppl (FREESTYLE LITE) DEVI Needs a meter to check sugar once daily DX E11.9 (Patient not taking: Reported on 09/26/2018)  . [DISCONTINUED] fluticasone (FLONASE) 50 MCG/ACT nasal spray Place 2 sprays into both nostrils daily. (Patient not taking: Reported on 09/26/2018)  . [DISCONTINUED] glucose blood (FREESTYLE LITE) test strip Check sugar once daily DX E11.9, freestyle lite strips (Patient not taking: Reported on 09/26/2018)  . [DISCONTINUED] glucose blood (ONE TOUCH ULTRA TEST) test strip Use as instructed (Patient not taking: Reported on 09/26/2018)  . [DISCONTINUED] Lancets (FREESTYLE) lancets Check sugar once daily. Needs freestyle lite lancets. DX E11.9 (Patient not taking: Reported on 09/26/2018)  . [DISCONTINUED] naproxen (NAPROSYN) 500 MG tablet Take 1 tablet (500 mg total) by mouth 2 (two) times daily as needed. (Patient not taking: Reported on 09/26/2018)  . [DISCONTINUED] vitamin C (ASCORBIC ACID) 250 MG tablet Take 250 mg by mouth daily.   No facility-administered encounter medications on file as of  09/26/2018.    Allergies  Allergen Reactions  . Acetaminophen-Codeine   . Clarithromycin     GI upset  . Codeine Phosphate     REACTION: difficulty breathing; vomiting  . Hydrochlorothiazide   . Peanuts  [Peanut Oil] Swelling  . Penicillins     REACTION: difficulty breathing; rash; vomiting  . Sulfacetamide Sodium     REACTION: difficulty breathing, rash, vomiting  .  Sulfa Antibiotics Rash    Past Medical History:  Diagnosis Date  . Allergy   . Anxiety   . Depression   . Diabetes mellitus without complication ([place])   . GERD (gastroesophageal reflux disease)   . Hyperlipidemia   . Hypertension    Past Surgical History:  Procedure Laterality Date  . Right Toe Surgery     Patient states between 2013 and 2014  . TUBAL LIGATION    . WRIST SURGERY  1994/1979        Past Gynecological History:   GYNECOLOGIC HISTORY:  . Patient's last menstrual period was 10/25/2009 (approximate).  . Menarche: [age] years old . P 0020 . Contraceptive prior . HRT none  . Last Pap one week ago. not available to [place]. Had abnormal paps in 1983 Family Hx:  Family History  Problem Relation Age of Onset  . Cancer Mother   . Heart disease Mother   . Hypertension Mother   . Anemia Mother   . Leukemia Mother   . CVA Mother   . Epilepsy Father   . Heart attack Father   . Diabetes Sister   . Heart disease Sister   . Arthritis Sister   . Cancer Maternal Aunt        breast  . Breast cancer Maternal Aunt    Social Hx:  Marland Kitchen Tobacco use: none . Alcohol use: none . Illicit Drug use: none . Illicit IV Drug use: none    Review of Systems: Review of Systems  Constitutional: Positive for fatigue.  Genitourinary: Positive for dyspareunia.   Musculoskeletal: Positive for arthralgias and back pain.  Psychiatric/Behavioral: The patient is nervous/anxious.     Vitals: Last menstrual period 10/25/2009. Vitals:   09/26/18 1024  Weight: 156 lb 4.8 oz (70.9 kg)  Height: 5' 2.5" (1.588 m)    Vitals:   09/26/18 1024  BP: (!) 158/94  Pulse: 87  Resp: 20  Temp: 98 F (36.7 C)  SpO2: 97%   Body mass index is 28.13 kg/m.  Physical Exam: General :  Overweight. Well developed, 57 y.o., female in no apparent distress HEENT:  Normocephalic/atraumatic, symmetric, EOMI, eyelids normal Neck:   Supple, no masses.  Lymphatics:  No cervical/ submandibular/  supraclavicular/ infraclavicular/ inguinal adenopathy Respiratory:  Respirations unlabored, no use of accessory muscles CV:   Deferred Breast:  Deferred Musculoskeletal: No CVA tenderness, normal muscle strength. Abdomen:  Soft, non-tender and nondistended. No evidence of hernia. No masses. Extremities:  No lymphedema, no erythema, non-tender. Skin:   Normal inspection Neuro/Psych:  No focal motor deficit, no abnormal mental status. Normal gait. Normal affect. Alert and oriented to person, place, and time  Genito Urinary: Vulva: External female genitalia notable for area of white abnormality at site of biopsy 7:00 and then additional tissue involved not in excision site traveling medially/internally towards the midline introitus. ~1cm total including the prior excision site Bladder/urethra: Urethral meatus normal in size and location. No lesions or   masses, well supported bladder Speculum exam: deferred given recent biopsy Bimanual exam: deferred given recent biopsy Rectovaginal:  Deferred  Assessment  Vulvar dysplasia ECOG PERFORMANCE STATUS: 0 - Asymptomatic  Plan  1. Data reviewed ? No images available or relevant for review ? I reviewed her referring doctor's office notes and I have summarized in the HPI ? History was obtained from the chart and the patient 2. We discussed the diagnosis and the grade 2-3 aspect indicating surgical excision ? She agrees to proceeding to excision 3. 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 Avoid nicotine exposure, even if second hand discussed. 5. Postoperative care was reviewed. 6. Schedule partial vulvectomy (7:00) 7. I will see her back in 10-14 days for pathology review and wound check.   Isabel Caprice, MD  09/26/2018, 10:47 AM    Cc: Arvella Nigh, MD (Referring Ob/Gyn) Jerrol Banana., MD (PCP)

## 2018-10-03 ENCOUNTER — Encounter (HOSPITAL_BASED_OUTPATIENT_CLINIC_OR_DEPARTMENT_OTHER): Payer: Self-pay

## 2018-10-04 ENCOUNTER — Other Ambulatory Visit: Payer: Self-pay

## 2018-10-04 ENCOUNTER — Encounter (HOSPITAL_BASED_OUTPATIENT_CLINIC_OR_DEPARTMENT_OTHER): Payer: Self-pay

## 2018-10-04 NOTE — Progress Notes (Signed)
Spoke with:  Belvie NPO:  After Midnight, no gum, candy, or mints   Arrival time:  0630AM Labs: Istat 8, (10/16/2017-EKG, chart/epic)  AM medications: Nexium, Xanax if needed, Bring Asthma inhaler day of surgery Pre op orders: Yes Ride home: Dellis Filbert (husband) [phone]

## 2018-10-09 ENCOUNTER — Encounter (HOSPITAL_BASED_OUTPATIENT_CLINIC_OR_DEPARTMENT_OTHER): Payer: Self-pay | Admitting: Emergency Medicine

## 2018-10-09 ENCOUNTER — Encounter (HOSPITAL_BASED_OUTPATIENT_CLINIC_OR_DEPARTMENT_OTHER)
Admission: RE | Disposition: A | Discharge status: Home / Self Care | Payer: Self-pay | Source: Ambulatory Visit | Attending: Obstetrics

## 2018-10-09 ENCOUNTER — Ambulatory Visit (HOSPITAL_BASED_OUTPATIENT_CLINIC_OR_DEPARTMENT_OTHER)
Admission: RE | Admit: 2018-10-09 | Discharge: 2018-10-09 | Disposition: A | Discharge status: Home / Self Care | Payer: BLUE CROSS/BLUE SHIELD | Source: Ambulatory Visit | Attending: Obstetrics | Admitting: Obstetrics

## 2018-10-09 ENCOUNTER — Ambulatory Visit (HOSPITAL_BASED_OUTPATIENT_CLINIC_OR_DEPARTMENT_OTHER): Payer: BLUE CROSS/BLUE SHIELD | Admitting: Anesthesiology

## 2018-10-09 ENCOUNTER — Other Ambulatory Visit: Payer: Self-pay

## 2018-10-09 DIAGNOSIS — Z8249 Family history of ischemic heart disease and other diseases of the circulatory system: Secondary | ICD-10-CM | POA: Diagnosis not present

## 2018-10-09 DIAGNOSIS — E785 Hyperlipidemia, unspecified: Secondary | ICD-10-CM | POA: Insufficient documentation

## 2018-10-09 DIAGNOSIS — Z885 Allergy status to narcotic agent status: Secondary | ICD-10-CM | POA: Insufficient documentation

## 2018-10-09 DIAGNOSIS — Z79899 Other long term (current) drug therapy: Secondary | ICD-10-CM | POA: Diagnosis not present

## 2018-10-09 DIAGNOSIS — F419 Anxiety disorder, unspecified: Secondary | ICD-10-CM | POA: Diagnosis not present

## 2018-10-09 DIAGNOSIS — M1990 Unspecified osteoarthritis, unspecified site: Secondary | ICD-10-CM | POA: Diagnosis not present

## 2018-10-09 DIAGNOSIS — E119 Type 2 diabetes mellitus without complications: Secondary | ICD-10-CM | POA: Insufficient documentation

## 2018-10-09 DIAGNOSIS — Z881 Allergy status to other antibiotic agents status: Secondary | ICD-10-CM | POA: Insufficient documentation

## 2018-10-09 DIAGNOSIS — K589 Irritable bowel syndrome without diarrhea: Secondary | ICD-10-CM | POA: Diagnosis not present

## 2018-10-09 DIAGNOSIS — Z8261 Family history of arthritis: Secondary | ICD-10-CM | POA: Insufficient documentation

## 2018-10-09 DIAGNOSIS — Z806 Family history of leukemia: Secondary | ICD-10-CM | POA: Insufficient documentation

## 2018-10-09 DIAGNOSIS — K219 Gastro-esophageal reflux disease without esophagitis: Secondary | ICD-10-CM | POA: Insufficient documentation

## 2018-10-09 DIAGNOSIS — R51 Headache: Secondary | ICD-10-CM | POA: Insufficient documentation

## 2018-10-09 DIAGNOSIS — Z803 Family history of malignant neoplasm of breast: Secondary | ICD-10-CM | POA: Diagnosis not present

## 2018-10-09 DIAGNOSIS — Z7982 Long term (current) use of aspirin: Secondary | ICD-10-CM | POA: Diagnosis not present

## 2018-10-09 DIAGNOSIS — Z882 Allergy status to sulfonamides status: Secondary | ICD-10-CM | POA: Diagnosis not present

## 2018-10-09 DIAGNOSIS — Z823 Family history of stroke: Secondary | ICD-10-CM | POA: Insufficient documentation

## 2018-10-09 DIAGNOSIS — Z833 Family history of diabetes mellitus: Secondary | ICD-10-CM | POA: Insufficient documentation

## 2018-10-09 DIAGNOSIS — Z7984 Long term (current) use of oral hypoglycemic drugs: Secondary | ICD-10-CM | POA: Insufficient documentation

## 2018-10-09 DIAGNOSIS — Z820 Family history of epilepsy and other diseases of the nervous system: Secondary | ICD-10-CM | POA: Diagnosis not present

## 2018-10-09 DIAGNOSIS — Z880 Allergy status to penicillin: Secondary | ICD-10-CM | POA: Insufficient documentation

## 2018-10-09 DIAGNOSIS — I10 Essential (primary) hypertension: Secondary | ICD-10-CM | POA: Diagnosis not present

## 2018-10-09 DIAGNOSIS — Z809 Family history of malignant neoplasm, unspecified: Secondary | ICD-10-CM | POA: Insufficient documentation

## 2018-10-09 DIAGNOSIS — N903 Dysplasia of vulva, unspecified: Secondary | ICD-10-CM | POA: Diagnosis not present

## 2018-10-09 DIAGNOSIS — Z91010 Allergy to peanuts: Secondary | ICD-10-CM | POA: Insufficient documentation

## 2018-10-09 DIAGNOSIS — G4730 Sleep apnea, unspecified: Secondary | ICD-10-CM | POA: Insufficient documentation

## 2018-10-09 DIAGNOSIS — D071 Carcinoma in situ of vulva: Secondary | ICD-10-CM | POA: Diagnosis not present

## 2018-10-09 DIAGNOSIS — F329 Major depressive disorder, single episode, unspecified: Secondary | ICD-10-CM | POA: Insufficient documentation

## 2018-10-09 DIAGNOSIS — N901 Moderate vulvar dysplasia: Secondary | ICD-10-CM | POA: Diagnosis not present

## 2018-10-09 HISTORY — DX: Dislocation of jaw, unspecified side, initial encounter: S03.00XA

## 2018-10-09 HISTORY — DX: Bronchitis, not specified as acute or chronic: J40

## 2018-10-09 HISTORY — DX: Otitis media, unspecified, unspecified ear: H66.90

## 2018-10-09 HISTORY — DX: Bitten or stung by nonvenomous insect and other nonvenomous arthropods, initial encounter: W57.XXXA

## 2018-10-09 HISTORY — DX: Personal history of diseases of the blood and blood-forming organs and certain disorders involving the immune mechanism: Z86.2

## 2018-10-09 HISTORY — DX: Other intervertebral disc displacement, lumbar region: M51.26

## 2018-10-09 HISTORY — DX: Personal history of colonic polyps: Z86.010

## 2018-10-09 HISTORY — DX: Other intervertebral disc degeneration, lumbar region: M51.36

## 2018-10-09 HISTORY — DX: Nonalcoholic steatohepatitis (NASH): K75.81

## 2018-10-09 HISTORY — DX: Personal history of other infectious and parasitic diseases: Z86.19

## 2018-10-09 HISTORY — DX: Anesthesia of skin: R20.0

## 2018-10-09 HISTORY — DX: Other intervertebral disc degeneration, lumbar region without mention of lumbar back pain or lower extremity pain: M51.369

## 2018-10-09 HISTORY — DX: Moderate vulvar dysplasia: N90.1

## 2018-10-09 HISTORY — DX: Hepatitis a without hepatic coma: B15.9

## 2018-10-09 HISTORY — DX: Personal history of colon polyps, unspecified: Z86.0100

## 2018-10-09 HISTORY — DX: Other specified postprocedural states: Z98.890

## 2018-10-09 HISTORY — DX: Presence of spectacles and contact lenses: Z97.3

## 2018-10-09 HISTORY — DX: Sleep apnea, unspecified: G47.30

## 2018-10-09 HISTORY — DX: Irritable bowel syndrome, unspecified: K58.9

## 2018-10-09 HISTORY — DX: Other specified postprocedural states: R11.2

## 2018-10-09 HISTORY — DX: Other cervical disc degeneration, unspecified cervical region: M50.30

## 2018-10-09 HISTORY — PX: VULVECTOMY PARTIAL: SHX6187

## 2018-10-09 LAB — POCT I-STAT, CHEM 8
BUN: 23 mg/dL — ABNORMAL HIGH (ref 6–20)
BUN: 28 mg/dL — AB (ref 6–20)
CALCIUM ION: 1.21 mmol/L (ref 1.15–1.40)
Calcium, Ion: 1.23 mmol/L (ref 1.15–1.40)
Chloride: 103 mmol/L (ref 98–111)
Chloride: 106 mmol/L (ref 98–111)
Creatinine, Ser: 0.8 mg/dL (ref 0.44–1.00)
Creatinine, Ser: 1.4 mg/dL — ABNORMAL HIGH (ref 0.44–1.00)
GLUCOSE: 101 mg/dL — AB (ref 70–99)
Glucose, Bld: 131 mg/dL — ABNORMAL HIGH (ref 70–99)
HCT: 33 % — ABNORMAL LOW (ref 36.0–46.0)
HCT: 30 % — ABNORMAL LOW (ref 36.0–46.0)
Hemoglobin: 11.2 g/dL — ABNORMAL LOW (ref 12.0–15.0)
Hemoglobin: 10.2 g/dL — ABNORMAL LOW (ref 12.0–15.0)
POTASSIUM: 3.2 mmol/L — AB (ref 3.5–5.1)
Potassium: 4.3 mmol/L (ref 3.5–5.1)
Sodium: 140 mmol/L (ref 135–145)
Sodium: 140 mmol/L (ref 135–145)
TCO2: 27 mmol/L (ref 22–32)
TCO2: 23 mmol/L (ref 22–32)

## 2018-10-09 LAB — GLUCOSE, CAPILLARY: GLUCOSE-CAPILLARY: 146 mg/dL — AB (ref 70–99)

## 2018-10-09 SURGERY — VULVECTOMY, PARTIAL
Anesthesia: General | Site: Vulva

## 2018-10-09 MED ORDER — TRAMADOL HCL 50 MG PO TABS: 50.0000 mg | ORAL_TABLET | Freq: Four times a day (QID) | ORAL | 0 refills | Status: DC | PRN

## 2018-10-09 MED ORDER — LIDOCAINE-EPINEPHRINE 1 %-1:100000 IJ SOLN
INTRAMUSCULAR | Status: DC | PRN
  Administered 2018-10-09: 10 mL

## 2018-10-09 MED ORDER — FENTANYL CITRATE (PF) 100 MCG/2ML IJ SOLN
INTRAMUSCULAR | Status: AC
  Filled 2018-10-09: qty 2

## 2018-10-09 MED ORDER — DEXAMETHASONE SODIUM PHOSPHATE 10 MG/ML IJ SOLN
INTRAMUSCULAR | Status: AC
  Filled 2018-10-09: qty 1

## 2018-10-09 MED ORDER — CLINDAMYCIN PHOSPHATE 900 MG/50ML IV SOLN
INTRAVENOUS | Status: AC
  Filled 2018-10-09: qty 50

## 2018-10-09 MED ORDER — EPHEDRINE SULFATE-NACL 50-0.9 MG/10ML-% IV SOSY
PREFILLED_SYRINGE | INTRAVENOUS | Status: DC | PRN
  Administered 2018-10-09: 10 mg via INTRAVENOUS

## 2018-10-09 MED ORDER — FENTANYL CITRATE (PF) 100 MCG/2ML IJ SOLN
INTRAMUSCULAR | Status: DC | PRN
  Administered 2018-10-09: 50 ug via INTRAVENOUS

## 2018-10-09 MED ORDER — CLINDAMYCIN PHOSPHATE 900 MG/50ML IV SOLN
900.0000 mg | INTRAVENOUS | Status: AC
  Administered 2018-10-09: 900 mg via INTRAVENOUS
  Filled 2018-10-09: qty 50

## 2018-10-09 MED ORDER — ONDANSETRON HCL 4 MG/2ML IJ SOLN
4.0000 mg | Freq: Four times a day (QID) | INTRAMUSCULAR | Status: DC | PRN
  Filled 2018-10-09: qty 2

## 2018-10-09 MED ORDER — LACTATED RINGERS IV SOLN
INTRAVENOUS | Status: DC
  Administered 2018-10-09: 08:00:00 via INTRAVENOUS
  Filled 2018-10-09: qty 1000

## 2018-10-09 MED ORDER — ACETIC ACID 5 % SOLN
Status: DC | PRN
  Administered 2018-10-09: 1 via TOPICAL

## 2018-10-09 MED ORDER — MIDAZOLAM HCL 2 MG/2ML IJ SOLN
INTRAMUSCULAR | Status: AC
  Filled 2018-10-09: qty 2

## 2018-10-09 MED ORDER — CIPROFLOXACIN IN D5W 400 MG/200ML IV SOLN
400.0000 mg | INTRAVENOUS | Status: AC
  Administered 2018-10-09: 400 mg via INTRAVENOUS
  Filled 2018-10-09: qty 200

## 2018-10-09 MED ORDER — SCOPOLAMINE 1 MG/3DAYS TD PT72
1.0000 | MEDICATED_PATCH | TRANSDERMAL | Status: DC
  Administered 2018-10-09: 1.5 mg via TRANSDERMAL
  Filled 2018-10-09: qty 1

## 2018-10-09 MED ORDER — MIDAZOLAM HCL 5 MG/5ML IJ SOLN
INTRAMUSCULAR | Status: DC | PRN
  Administered 2018-10-09: 2 mg via INTRAVENOUS

## 2018-10-09 MED ORDER — DEXAMETHASONE SODIUM PHOSPHATE 4 MG/ML IJ SOLN
INTRAMUSCULAR | Status: DC | PRN
  Administered 2018-10-09: 10 mg via INTRAVENOUS

## 2018-10-09 MED ORDER — EPHEDRINE 5 MG/ML INJ
INTRAVENOUS | Status: AC
  Filled 2018-10-09: qty 10

## 2018-10-09 MED ORDER — LIDOCAINE 2% (20 MG/ML) 5 ML SYRINGE
INTRAMUSCULAR | Status: AC
  Filled 2018-10-09: qty 5

## 2018-10-09 MED ORDER — PROPOFOL 10 MG/ML IV BOLUS
INTRAVENOUS | Status: AC
  Filled 2018-10-09: qty 20

## 2018-10-09 MED ORDER — FENTANYL CITRATE (PF) 100 MCG/2ML IJ SOLN
25.0000 ug | INTRAMUSCULAR | Status: DC | PRN
  Filled 2018-10-09: qty 1

## 2018-10-09 MED ORDER — ONDANSETRON HCL 4 MG/2ML IJ SOLN
INTRAMUSCULAR | Status: AC
  Filled 2018-10-09: qty 2

## 2018-10-09 MED ORDER — SCOPOLAMINE 1 MG/3DAYS TD PT72
MEDICATED_PATCH | TRANSDERMAL | Status: AC
  Filled 2018-10-09: qty 1

## 2018-10-09 MED ORDER — CIPROFLOXACIN IN D5W 400 MG/200ML IV SOLN
INTRAVENOUS | Status: AC
  Filled 2018-10-09: qty 200

## 2018-10-09 MED ORDER — LIDOCAINE 2% (20 MG/ML) 5 ML SYRINGE
INTRAMUSCULAR | Status: DC | PRN
  Administered 2018-10-09: 60 mg via INTRAVENOUS

## 2018-10-09 MED ORDER — PROPOFOL 10 MG/ML IV BOLUS
INTRAVENOUS | Status: DC | PRN
  Administered 2018-10-09: 160 mg via INTRAVENOUS

## 2018-10-09 SURGICAL SUPPLY — 33 items
BLADE CLIPPER SURG (BLADE) IMPLANT
BLADE SURG 15 STRL LF DISP TIS (BLADE) ×1 IMPLANT
BLADE SURG 15 STRL SS (BLADE) ×1
CANISTER SUCT 3000ML PPV (MISCELLANEOUS) ×2 IMPLANT
CATH ROBINSON RED A/P 16FR (CATHETERS) IMPLANT
COVER WAND RF STERILE (DRAPES) ×2 IMPLANT
GAUZE 4X4 16PLY RFD (DISPOSABLE) ×2 IMPLANT
GLOVE BIO SURGEON STRL SZ 6.5 (GLOVE) ×4 IMPLANT
GLOVE BIOGEL PI IND STRL 6.5 (GLOVE) ×1 IMPLANT
GLOVE BIOGEL PI IND STRL 7.0 (GLOVE) ×1 IMPLANT
GLOVE BIOGEL PI IND STRL 7.5 (GLOVE) ×1 IMPLANT
GLOVE BIOGEL PI INDICATOR 6.5 (GLOVE) ×1
GLOVE BIOGEL PI INDICATOR 7.0 (GLOVE) ×1
GLOVE BIOGEL PI INDICATOR 7.5 (GLOVE) ×1
GLOVE ECLIPSE 7.0 STRL STRAW (GLOVE) ×2 IMPLANT
GOWN STRL REUS W/TWL LRG LVL3 (GOWN DISPOSABLE) ×2 IMPLANT
KIT TURNOVER CYSTO (KITS) ×2 IMPLANT
NEEDLE HYPO 25X1 1.5 SAFETY (NEEDLE) ×2 IMPLANT
NS IRRIG 500ML POUR BTL (IV SOLUTION) ×2 IMPLANT
PACK COOL COMFORT PERI COLD (SET/KITS/TRAYS/PACK) ×2 IMPLANT
PACK VAGINAL WOMENS (CUSTOM PROCEDURE TRAY) ×2 IMPLANT
PAD OB MATERNITY 4.3X12.25 (PERSONAL CARE ITEMS) ×2 IMPLANT
PENCIL BUTTON HOLSTER BLD 10FT (ELECTRODE) ×2 IMPLANT
SUT VIC AB 0 CT1 36 (SUTURE) ×2 IMPLANT
SUT VIC AB 2-0 SH 27 (SUTURE)
SUT VIC AB 2-0 SH 27XBRD (SUTURE) IMPLANT
SUT VIC AB 3-0 SH 27 (SUTURE)
SUT VIC AB 3-0 SH 27X BRD (SUTURE) IMPLANT
SYR BULB IRRIGATION 50ML (SYRINGE) ×2 IMPLANT
TOWEL OR 17X24 6PK STRL BLUE (TOWEL DISPOSABLE) ×4 IMPLANT
TUBE CONNECTING 12X1/4 (SUCTIONS) ×2 IMPLANT
WATER STERILE IRR 500ML POUR (IV SOLUTION) IMPLANT
YANKAUER SUCT BULB TIP NO VENT (SUCTIONS) ×2 IMPLANT

## 2018-10-09 NOTE — Anesthesia Procedure Notes (Signed)
Procedure Name: LMA Insertion Date/Time: 10/09/2018 7:09 AM Performed by: Albertha Ghee, MD Pre-anesthesia Checklist: Patient identified, Emergency Drugs available, Suction available and Patient being monitored Patient Re-evaluated:Patient Re-evaluated prior to induction Oxygen Delivery Method: Circle system utilized Preoxygenation: Pre-oxygenation with 100% oxygen Induction Type: IV induction Ventilation: Mask ventilation without difficulty LMA: LMA inserted LMA Size: 4.0 Number of attempts: 1 Airway Equipment and Method: Bite block Placement Confirmation: positive ETCO2 Tube secured with: Tape Dental Injury: Teeth and Oropharynx as per pre-operative assessment

## 2018-10-09 NOTE — Transfer of Care (Signed)
  Last Vitals:  Vitals Value Taken Time  BP 134/83 10/09/2018  9:45 AM  Temp 36.6 C 10/09/2018  9:44 AM  Pulse 98 10/09/2018  9:46 AM  Resp 23 10/09/2018  9:46 AM  SpO2 94 % 10/09/2018  9:46 AM  Vitals shown include unvalidated device data.  Last Pain:  Vitals:   10/09/18 0706  TempSrc: Oral         Immediate Anesthesia Transfer of Care Note  Patient: Hailey Horton  Procedure(s) Performed: Procedure(s) (LRB): VULVECTOMY PARTIAL (N/A)  Patient Location: PACU  Anesthesia Type: General  Level of Consciousness: awake, alert  and oriented  Airway & Oxygen Therapy: Patient Spontanous Breathing and Patient connected to nasal oxygen  Post-op Assessment: Report given to PACU RN and Post -op Vital signs reviewed and stable  Post vital signs: Reviewed and stable  Complications: No apparent anesthesia complications

## 2018-10-09 NOTE — Interval H&P Note (Signed)
History and Physical Interval Note:  10/09/2018 8:22 AM  Hailey Horton  has presented today for surgery, with the diagnosis of vulvar dysplasia  The various methods of treatment have been discussed with the patient and family. After consideration of risks, benefits and other options for treatment, the patient has consented to  Procedure(s): VULVECTOMY PARTIAL (N/A) as a surgical intervention .  The patient's history has been reviewed, patient examined, no change in status, stable for surgery.  I have reviewed the patient's chart and labs.  Questions were answered to the patient's satisfaction.     Isabel Caprice

## 2018-10-09 NOTE — Anesthesia Preprocedure Evaluation (Signed)
Anesthesia Evaluation  Patient identified by MRN, date of birth, ID band Patient awake    Reviewed: Allergy & Precautions, H&P , NPO status , Patient's Chart, lab work & pertinent test results  History of Anesthesia Complications (+) PONV and history of anesthetic complications  Airway Mallampati: II   Neck ROM: full    Dental   Pulmonary sleep apnea ,    breath sounds clear to auscultation       Cardiovascular hypertension,  Rhythm:regular Rate:Normal     Neuro/Psych  Headaches, PSYCHIATRIC DISORDERS Anxiety Depression    GI/Hepatic GERD  ,IBS   Endo/Other  diabetes, Type 2  Renal/GU      Musculoskeletal  (+) Arthritis ,   Abdominal   Peds  Hematology   Anesthesia Other Findings   Reproductive/Obstetrics                             Anesthesia Physical Anesthesia Plan  ASA: III  Anesthesia Plan: General   Post-op Pain Management:    Induction: Intravenous  PONV Risk Score and Plan: 4 or greater and Ondansetron, Dexamethasone, Midazolam, Scopolamine patch - Pre-op and Treatment may vary due to age or medical condition  Airway Management Planned: LMA  Additional Equipment:   Intra-op Plan:   Post-operative Plan:   Informed Consent: I have reviewed the patients History and Physical, chart, labs and discussed the procedure including the risks, benefits and alternatives for the proposed anesthesia with the patient or authorized representative who has indicated his/her understanding and acceptance.     Plan Discussed with: CRNA, Anesthesiologist and Surgeon  Anesthesia Plan Comments:         Anesthesia Quick Evaluation

## 2018-10-09 NOTE — Discharge Instructions (Signed)

## 2018-10-09 NOTE — Op Note (Signed)
OPERATIVE REPORT 10/09/18   Preop Diagnosis: Vulvar dysplasia  Postoperative Diagnosis: Same  Surgery Performed: Partial simple vulvectomy  Surgeon:  Precious Haws LBull  Assistant: none  Anesthesia: LMA  Estimated blood loss: 1 ml  Complications: None   Pathology: vulva with marking stitch at 7:00   Operative findings: Previous biopsy and aceto-white lesion on right vulva at 7:00 therefore tag also placed at 7:00  Procedure in Detail:  The patient was then taken to the operating room and placed in the supine position with SCD hose on. General anesthesia was then induced without difficulty. She was then placed in the dorsolithotomy position. The perineum was prepped with Betadine. The vagina was prepped with Betadine. In and out catheterization performed. The patient was then draped after the prep was dried.   Timeout was performed the patient, procedure, antibiotic, allergy. 5% acetic acid solution was applied to the perineum. The vulvar tissues were inspected for areas of acetowhite changes or leukoplakia.  See findings. The lesion was identified and the marking pen was used to circumscribe the area with planned appropriate surgical margins. The subcuticular tissues were infiltrated with 1% lidocaine. The 15 blade scalpel was used to make an incision through the skin circumferentially as marked. The skin elipse was grasped and was separated from the underlying deep dermal tissues with the scalpel.  After the specimen had been completely resected, it was oriented and marked at 7:00 with a 0-vicryl suture. The bovie was used to obtain hemostasis at the surgical bed. The subcutaneous tissues were irrigated and made hemostatic.   The cutaneous layer was closed with interrupted 3-0 vicryl stitches in a vertical mattress fashion to ensure a tension free and hemostatic closure.   A single digit was placed per rectum to be sure no injury or sutures. The excision site was at least 1cm  superior and lateral to any rectal wall on palpation.   All instrument, suture, laparotomy, Ray-Tec, and needle counts were correct x2. The patient tolerated the procedure well and was taken recovery room in stable condition.   Disposition: PACU  taken recovery room in stable condition.

## 2018-10-10 ENCOUNTER — Encounter (HOSPITAL_BASED_OUTPATIENT_CLINIC_OR_DEPARTMENT_OTHER): Payer: Self-pay | Admitting: Obstetrics

## 2018-10-10 NOTE — Anesthesia Postprocedure Evaluation (Signed)
Anesthesia Post Note  Patient: Hailey Horton  Procedure(s) Performed: VULVECTOMY PARTIAL (N/A Vulva)     Patient location during evaluation: PACU Anesthesia Type: General Level of consciousness: awake and alert Pain management: pain level controlled Vital Signs Assessment: post-procedure vital signs reviewed and stable Respiratory status: spontaneous breathing, nonlabored ventilation, respiratory function stable and patient connected to nasal cannula oxygen Cardiovascular status: blood pressure returned to baseline and stable Postop Assessment: no apparent nausea or vomiting Anesthetic complications: no    Last Vitals:  Vitals:   10/09/18 1115 10/09/18 1215  BP: 123/81 111/72  Pulse: 84 89  Resp: 17 14  Temp:  36.7 C  SpO2: 93% 93%    Last Pain:  Vitals:   10/10/18 1210  TempSrc:   PainSc: [place]

## 2018-10-11 ENCOUNTER — Telehealth: Payer: Self-pay

## 2018-10-11 NOTE — Telephone Encounter (Signed)
Told Hailey Horton that the pathology showed pre cancerous cells only. No cancer per Hailey John, NP. Hailey Horton is using aleve for pain as she was not able to pick up the tramadol due to transportation issues.  She is going to get it today. Encouraged her to apply ice to the area to help with pain.   Hailey Horton has not moved her bowels and just took a stool softener.  She has miralax on hand and suggested she take a capful now and can take bid as needed. Hailey Horton aware of post op appointment on 10-19-18 at 1445.  She can call the office if she has any questions or concerns prior to post op visit. Hailey Horton verbalized understanding.

## 2018-10-19 ENCOUNTER — Inpatient Hospital Stay (HOSPITAL_BASED_OUTPATIENT_CLINIC_OR_DEPARTMENT_OTHER): Payer: BLUE CROSS/BLUE SHIELD | Admitting: Obstetrics

## 2018-10-19 VITALS — BP 113/98 | HR 88 | Temp 98.3°F | Resp 16 | Ht 62.0 in | Wt 156.5 lb

## 2018-10-19 DIAGNOSIS — Z9889 Other specified postprocedural states: Secondary | ICD-10-CM

## 2018-10-19 DIAGNOSIS — N903 Dysplasia of vulva, unspecified: Secondary | ICD-10-CM

## 2018-10-19 DIAGNOSIS — D071 Carcinoma in situ of vulva: Secondary | ICD-10-CM

## 2018-10-19 NOTE — Patient Instructions (Signed)
Return in 6 months for a vulvar exam Call if any wound concerns before then. We will put in a Dermatology referral

## 2018-10-21 NOTE — Progress Notes (Signed)
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  O:  Vitals:   10/19/18 1400  BP: (!) 113/98  Pulse: 88  Resp: 16  Temp: 98.3 F (36.8 C)  SpO2: 98%   Vulva: wound healing well area no erythema sutures are intact  A/P For continued follow-up with recheck in 6 months given the positive margin medially. She is to call us if any issues prior to that time The area appears to be clear at that time we will disposition her back to her referring GYN   Cc: Arvella Nigh, MD (Referring Ob/Gyn) Jerrol Banana., MD (PCP)

## 2018-10-22 ENCOUNTER — Telehealth: Payer: Self-pay

## 2018-10-22 NOTE — Telephone Encounter (Signed)
Outgoing call to [facility] to obtain their fax number as Joylene John NP has placed a referral there for pt for "skin lesion on abd."  I spoke to [place] there and she said new pt coordinator would be back tomorrow and review fax and call pt for appt.  I attempted to reach pt to be expecting their call-no answer, left VM.

## 2018-10-24 ENCOUNTER — Telehealth: Payer: Self-pay | Admitting: *Deleted

## 2018-10-24 NOTE — Telephone Encounter (Signed)
Refax records to [facility].

## 2018-10-29 ENCOUNTER — Telehealth: Payer: Self-pay | Admitting: *Deleted

## 2018-10-29 NOTE — Telephone Encounter (Signed)
Juliann Pulse called from [facility] dermatology with an appt on November 5th at 11:50am.

## 2018-10-31 ENCOUNTER — Telehealth: Payer: Self-pay | Admitting: *Deleted

## 2018-10-31 NOTE — Telephone Encounter (Signed)
Called and left a the patient a message to call the office back. Need to move the appt from 4/22 to 4/24

## 2018-11-08 ENCOUNTER — Telehealth: Payer: Self-pay | Admitting: *Deleted

## 2018-11-08 NOTE — Telephone Encounter (Signed)
Called and moved appt from April 22nd to April 24th

## 2018-11-17 ENCOUNTER — Other Ambulatory Visit: Payer: Self-pay | Admitting: Family Medicine

## 2018-11-17 DIAGNOSIS — E119 Type 2 diabetes mellitus without complications: Secondary | ICD-10-CM

## 2018-11-23 ENCOUNTER — Other Ambulatory Visit: Payer: Self-pay | Admitting: Family Medicine

## 2018-11-23 DIAGNOSIS — K219 Gastro-esophageal reflux disease without esophagitis: Secondary | ICD-10-CM

## 2019-01-07 ENCOUNTER — Encounter: Payer: Self-pay | Admitting: Family Medicine

## 2019-01-07 ENCOUNTER — Ambulatory Visit: Payer: BLUE CROSS/BLUE SHIELD | Admitting: Family Medicine

## 2019-01-07 VITALS — BP 126/84 | HR 85 | Temp 98.2°F | Wt 157.2 lb

## 2019-01-07 DIAGNOSIS — J069 Acute upper respiratory infection, unspecified: Secondary | ICD-10-CM | POA: Diagnosis not present

## 2019-01-07 NOTE — Patient Instructions (Signed)
Add Mucinex and saline nasal spray for congestion. Continue Delsym for cough. Call me Friday if not continuing to improve.

## 2019-01-07 NOTE — Progress Notes (Signed)
  Subjective:     Patient ID: Hailey Horton, female   DOB: [birth year], 58 y.o.   MRN: [id number] Chief Complaint  Patient presents with  . URI    Patient presents today for URI symptoms for about 1 week. Patient is having the following symptoms: cough, headaches, dry eyes, sinus pressure and chest pressure. Patient has been [employer].    HPI Reports cold sx with dry cough and minimal sinus congestion. Has been taking Nyquil and Delsym for her sx.  Review of Systems     Objective:   Physical Exam Constitutional:      General: She is not in acute distress.    Appearance: She is ill-appearing.  Neurological:     Mental Status: She is alert.   Ears: T.M's intact without inflammation Sinuses: non-tender Throat: no tonsillar enlargement or exudate Neck: no cervical adenopathy Lungs: clear     Assessment:    1. URI, acute    Plan:    Add Mucinex and saline spray; continue Delsym for cough. To call inf not further improving over the course of the week.

## 2019-01-14 ENCOUNTER — Ambulatory Visit: Payer: Self-pay | Admitting: Family Medicine

## 2019-01-15 ENCOUNTER — Encounter: Payer: Self-pay | Admitting: Family Medicine

## 2019-01-15 ENCOUNTER — Ambulatory Visit (INDEPENDENT_AMBULATORY_CARE_PROVIDER_SITE_OTHER): Payer: BLUE CROSS/BLUE SHIELD | Admitting: Family Medicine

## 2019-01-15 VITALS — BP 138/82 | HR 94 | Temp 97.8°F | Wt 156.8 lb

## 2019-01-15 DIAGNOSIS — E559 Vitamin D deficiency, unspecified: Secondary | ICD-10-CM

## 2019-01-15 DIAGNOSIS — J42 Unspecified chronic bronchitis: Secondary | ICD-10-CM | POA: Diagnosis not present

## 2019-01-15 DIAGNOSIS — F419 Anxiety disorder, unspecified: Secondary | ICD-10-CM | POA: Diagnosis not present

## 2019-01-15 DIAGNOSIS — E119 Type 2 diabetes mellitus without complications: Secondary | ICD-10-CM

## 2019-01-15 LAB — POCT GLYCOSYLATED HEMOGLOBIN (HGB A1C)
Est. average glucose Bld gHb Est-mCnc: 180
Hemoglobin A1C: 7.9 % — AB (ref 4.0–5.6)

## 2019-01-15 MED ORDER — AZITHROMYCIN 250 MG PO TABS: ORAL_TABLET | ORAL | 0 refills | Status: DC

## 2019-01-15 MED ORDER — VITAMIN D (ERGOCALCIFEROL) 1.25 MG (50000 UNIT) PO CAPS: 50000.0000 [IU] | ORAL_CAPSULE | ORAL | 3 refills | Status: DC

## 2019-01-15 NOTE — Progress Notes (Signed)
Patient: Hailey Horton Female    DOB: 03-26-1961   58 y.o.   MRN: [id number] Visit Date: 01/15/2019  Today's Provider: Wilhemena Durie, MD   Chief Complaint  Patient presents with  . Hypertension  . Diabetes  . URI   Subjective:    URI   This is a recurrent problem. The current episode started 1 to 4 weeks ago. The problem has been unchanged. There has been no fever. Associated symptoms include chest pain (from coughing), congestion, coughing, diarrhea (3 days ago), ear pain (right ear), headaches, joint pain, nausea, sinus pain (below eyes), sneezing and a sore throat (this morning 01/15/2019). Pertinent negatives include no abdominal pain, vomiting or wheezing. She has tried NSAIDs, decongestant and increased fluids for the symptoms. The treatment provided no relief.    Diabetes Mellitus Type II, Follow-up:   Lab Results  Component Value Date   HGBA1C 7.3 (A) 09/13/2018   HGBA1C 7.0 03/06/2018   HGBA1C 7.0 10/31/2017   Last seen for diabetes 4 months ago.  Management since then includes none. She reports good compliance with treatment. She is not having side effects.  Current symptoms include none and have been stable. Home blood sugar records: NA  Episodes of hypoglycemia? no   Current Insulin Regimen: no Most Recent Eye Exam: 2019 Weight trend: stable Prior visit with dietician: no Current diet: low salt Current exercise: none  ------------------------------------------------------------------------   Hypertension, follow-up:  BP Readings from Last 3 Encounters:  01/15/19 138/82  01/07/19 126/84  10/19/18 (!) 113/98    She was last seen for hypertension 4 months ago.  BP at that visit was 124/62 . Management since that visit includes none.She reports good compliance with treatment. She is not having side effects.  She is not exercising. She is adherent to low salt diet.   Outside blood pressures are no. She is experiencing none.  Patient  denies chest pain, chest pressure/discomfort, fatigue, irregular heart beat, lower extremity edema and palpitations.   Cardiovascular risk factors include diabetes mellitus and hypertension.  Use of agents associated with hypertension: NSAIDS.   ------------------------------------------------------------------------       Allergies  Allergen Reactions  . Shellfish Allergy Anaphylaxis  . Acetaminophen-Codeine   . Clarithromycin     GI upset  . Codeine Phosphate     REACTION: difficulty breathing; vomiting  . Hydrochlorothiazide   . Other     Blue cheese N/V, diarrhea  . Peanuts  [Peanut Oil] Swelling  . Penicillins     REACTION: difficulty breathing; rash; vomiting  . Sulfacetamide Sodium     REACTION: difficulty breathing, rash, vomiting  . Amoxicillin Rash  . Sulfa Antibiotics Rash     Current Outpatient Medications:  .  albuterol (PROVENTIL HFA;VENTOLIN HFA) 108 (90 Base) MCG/ACT inhaler, 1 puff daily as needed, Disp: 1 Inhaler, Rfl: 0 .  ALPRAZolam (XANAX) 0.5 MG tablet, TAKE 1/2 TO 1 TABLET TWICE A DAY AS NEEDED, Disp: 60 tablet, Rfl: 5 .  calcium carbonate (TUMS - DOSED IN MG ELEMENTAL CALCIUM) 500 MG chewable tablet, Chew 1 tablet by mouth as needed for indigestion or heartburn., Disp: , Rfl:  .  carboxymethylcellulose (REFRESH PLUS) 0.5 % SOLN, 1 drop 3 (three) times daily as needed., Disp: , Rfl:  .  diclofenac sodium (VOLTAREN) 1 % GEL, Apply 2 g topically as needed. , Disp: , Rfl:  .  diphenhydrAMINE (BENADRYL) 25 mg capsule, Take 25 mg by mouth every 6 (six) hours as needed.,  Disp: , Rfl:  .  EPINEPHrine 0.3 mg/0.3 mL IJ SOAJ injection, Inject into the muscle once., Disp: , Rfl:  .  escitalopram (LEXAPRO) 20 MG tablet, TAKE 1 TABLET (20 MG TOTAL) BY MOUTH DAILY. (Patient taking differently: Take 20 mg by mouth every evening. ), Disp: 90 tablet, Rfl: 3 .  esomeprazole (NEXIUM) 40 MG capsule, TAKE 1 CAPSULE (40 MG TOTAL) BY MOUTH DAILY., Disp: 90 capsule, Rfl: 3 .   metFORMIN (GLUCOPHAGE) 1000 MG tablet, TAKE 1 TABLET (1,000 MG TOTAL) DAILY WITH BREAKFAST BY MOUTH., Disp: 90 tablet, Rfl: 4 .  Multiple Vitamin (MULTIVITAMIN) tablet, Take 1 tablet by mouth as needed. , Disp: , Rfl:  .  naproxen sodium (ALEVE) 220 MG tablet, Take 220 mg by mouth daily as needed., Disp: , Rfl:  .  nystatin ointment (MYCOSTATIN), Apply 1 application topically 2 (two) times daily. To affected area (Patient taking differently: Apply 1 application topically as needed. To affected area), Disp: 60 g, Rfl: 1 .  quinapril (ACCUPRIL) 20 MG tablet, TAKE 1 TABLET BY MOUTH EVERY DAY, Disp: 90 tablet, Rfl: 3 .  simvastatin (ZOCOR) 20 MG tablet, TAKE 1 TABLET BY MOUTH EVERYDAY AT BEDTIME, Disp: 30 tablet, Rfl: 11 .  traMADol (ULTRAM) 50 MG tablet, Take 1 tablet (50 mg total) by mouth every 6 (six) hours as needed for up to 7 doses., Disp: 7 tablet, Rfl: 0 .  Vitamin D, Ergocalciferol, (DRISDOL) 50000 units CAPS capsule, TAKE 1 CAPSULE (50,000 UNITS TOTAL) BY MOUTH EVERY 7 (SEVEN) DAYS. (Patient taking differently: Take 50,000 Units by mouth every 30 (thirty) days. ), Disp: 12 capsule, Rfl: 3  Review of Systems  Constitutional: Negative.   HENT: Positive for congestion, ear pain (right ear), sinus pain (below eyes), sneezing and sore throat (this morning 01/15/2019).   Eyes: Negative.   Respiratory: Positive for cough. Negative for wheezing.   Cardiovascular: Positive for chest pain (from coughing).  Gastrointestinal: Positive for diarrhea (3 days ago) and nausea. Negative for abdominal pain and vomiting.  Endocrine: Negative.   Musculoskeletal: Positive for arthralgias and joint pain.  Allergic/Immunologic: Negative.   Neurological: Positive for headaches.  Psychiatric/Behavioral: Negative.     Social History   Tobacco Use  . Smoking status: Never Smoker  . Smokeless tobacco: Never Used  Substance Use Topics  . Alcohol use: No      Objective:   BP 138/82 (BP Location: Left Arm,  Patient Position: Sitting, Cuff Size: Normal)   Pulse 94   Temp 97.8 F (36.6 C) (Oral)   Wt 156 lb 12.8 oz (71.1 kg)   LMP 10/25/2009 (Approximate) Comment: tubal ligation  SpO2 96%   BMI 28.68 kg/m  Vitals:   01/15/19 1345  BP: 138/82  Pulse: 94  Temp: 97.8 F (36.6 C)  TempSrc: Oral  SpO2: 96%  Weight: 156 lb 12.8 oz (71.1 kg)     Physical Exam Constitutional:      Appearance: She is well-developed.  HENT:     Head: Normocephalic and atraumatic.  Eyes:     General: No scleral icterus.    Conjunctiva/sclera: Conjunctivae normal.  Neck:     Thyroid: No thyromegaly.  Cardiovascular:     Rate and Rhythm: Normal rate and regular rhythm.     Heart sounds: Normal heart sounds.  Pulmonary:     Effort: Pulmonary effort is normal.     Breath sounds: Normal breath sounds.  Abdominal:     Palpations: Abdomen is soft.  Lymphadenopathy:  Cervical: No cervical adenopathy.  Skin:    General: Skin is warm and dry.  Neurological:     Mental Status: She is alert and oriented to person, place, and time.  Psychiatric:        Behavior: Behavior normal.        Thought Content: Thought content normal.        Judgment: Judgment normal.         Assessment & Plan    1. Type 2 diabetes mellitus without complication, without long-term current use of insulin (HCC) Slightly worse.  Work on habits. - POCT glycosylated hemoglobin (Hb A1C)--7.9 today  2. Chronic bronchitis, unspecified chronic bronchitis type ([place]) This is been going on for some time.  Chest x-ray if she does not improve. - azithromycin (ZITHROMAX) 250 MG tablet; 2 PO on day 1 then 1 PO daily x 4 days  Dispense: 6 tablet; Refill: 0  3. Avitaminosis D  - Vitamin D, Ergocalciferol, (DRISDOL) 1.25 MG (50000 UT) CAPS capsule; Take 1 capsule (50,000 Units total) by mouth every 7 (seven) days.  Dispense: 12 capsule; Refill: 3  4. Anxiety Chronic issue.  Patient states she is stable. - ALPRAZolam (XANAX) 0.5 MG  tablet; TAKE 1/2 TO 1 TABLET TWICE A DAY AS NEEDED  Dispense: 60 tablet; Refill: 5  I, Porsha McClurkin, CMA, am acting as a scribe for Wilhemena Durie, MD.     I have done the exam and reviewed the above chart and it is accurate to the best of my knowledge. Development worker, community has been used in this note in any air is in the dictation or transcription are unintentional.  Wilhemena Durie, MD  [place]

## 2019-01-22 MED ORDER — ALPRAZOLAM 0.5 MG PO TABS: ORAL_TABLET | ORAL | 5 refills | Status: DC

## 2019-02-21 ENCOUNTER — Telehealth: Payer: Self-pay | Admitting: Family Medicine

## 2019-02-21 NOTE — Telephone Encounter (Signed)
Please advise 

## 2019-02-21 NOTE — Telephone Encounter (Signed)
Pt was in about three weeks ago with cough, sore throat.  She is having all the same symptoms again. She wants to know if we can send something to the pharmacy.   CB#  [phone]  [facility] [address]  Thanks teri

## 2019-02-22 NOTE — Telephone Encounter (Signed)
She was treated 5 weeks ago.  If she is still feeling sick or feeling worse she needs to be rechecked.

## 2019-02-25 ENCOUNTER — Ambulatory Visit (INDEPENDENT_AMBULATORY_CARE_PROVIDER_SITE_OTHER): Payer: BLUE CROSS/BLUE SHIELD | Admitting: Physician Assistant

## 2019-02-25 ENCOUNTER — Ambulatory Visit
Admission: RE | Admit: 2019-02-25 | Discharge: 2019-02-25 | Disposition: A | Discharge status: Home / Self Care | Payer: BLUE CROSS/BLUE SHIELD | Source: Ambulatory Visit | Attending: Physician Assistant | Admitting: Physician Assistant

## 2019-02-25 ENCOUNTER — Ambulatory Visit
Admission: RE | Admit: 2019-02-25 | Discharge: 2019-02-25 | Disposition: A | Discharge status: Home / Self Care | Payer: BLUE CROSS/BLUE SHIELD | Attending: Physician Assistant | Admitting: Physician Assistant

## 2019-02-25 ENCOUNTER — Other Ambulatory Visit: Payer: Self-pay

## 2019-02-25 ENCOUNTER — Encounter: Payer: Self-pay | Admitting: Physician Assistant

## 2019-02-25 VITALS — BP 126/82 | HR 88 | Temp 98.5°F | Resp 16 | Wt 157.0 lb

## 2019-02-25 DIAGNOSIS — J40 Bronchitis, not specified as acute or chronic: Secondary | ICD-10-CM

## 2019-02-25 DIAGNOSIS — R05 Cough: Secondary | ICD-10-CM | POA: Insufficient documentation

## 2019-02-25 DIAGNOSIS — R059 Cough, unspecified: Secondary | ICD-10-CM

## 2019-02-25 MED ORDER — ALBUTEROL SULFATE HFA 108 (90 BASE) MCG/ACT IN AERS: 2.0000 | INHALATION_SPRAY | Freq: Four times a day (QID) | RESPIRATORY_TRACT | 2 refills | Status: DC | PRN

## 2019-02-25 MED ORDER — PREDNISONE 20 MG PO TABS: 20.0000 mg | ORAL_TABLET | Freq: Every day | ORAL | 0 refills | Status: AC

## 2019-02-25 NOTE — Patient Instructions (Signed)
Acute Bronchitis, Adult Acute bronchitis is when air tubes (bronchi) in the lungs suddenly get swollen. The condition can make it hard to breathe. It can also cause these symptoms:  A cough.  Coughing up clear, yellow, or green mucus.  Wheezing.  Chest congestion.  Shortness of breath.  A fever.  Body aches.  Chills.  A sore throat. Follow these instructions at home:  Medicines  Take over-the-counter and prescription medicines only as told by your doctor.  If you were prescribed an antibiotic medicine, take it as told by your doctor. Do not stop taking the antibiotic even if you start to feel better. General instructions  Rest.  Drink enough fluids to keep your pee (urine) pale yellow.  Avoid smoking and secondhand smoke. If you smoke and you need help quitting, ask your doctor. Quitting will help your lungs heal faster.  Use an inhaler, cool mist vaporizer, or humidifier as told by your doctor.  Keep all follow-up visits as told by your doctor. This is important. How is this prevented? To lower your risk of getting this condition again:  Wash your hands often with soap and water. If you cannot use soap and water, use hand sanitizer.  Avoid contact with people who have cold symptoms.  Try not to touch your hands to your mouth, nose, or eyes.  Make sure to get the flu shot every year. Contact a doctor if:  Your symptoms do not get better in 2 weeks. Get help right away if:  You cough up blood.  You have chest pain.  You have very bad shortness of breath.  You become dehydrated.  You faint (pass out) or keep feeling like you are going to pass out.  You keep throwing up (vomiting).  You have a very bad headache.  Your fever or chills gets worse. This information is not intended to replace advice given to you by your health care provider. Make sure you discuss any questions you have with your health care provider. Document Released: 05/30/2008 Document  Revised: 07/26/2017 Document Reviewed: 06/01/2016 Elsevier Interactive Patient Education  2019 Elsevier Inc.  

## 2019-02-25 NOTE — Progress Notes (Signed)
Patient: Hailey Horton Female    DOB: 12/01/1961   58 y.o.   MRN: [id number] Visit Date: 02/25/2019  Today's Provider: Trinna Post, PA-C   Chief Complaint  Patient presents with  . URI    Started about a week ago   Subjective:     URI   This is a new problem. The current episode started in the past 7 days. The problem has been gradually worsening. There has been no fever. Associated symptoms include congestion, coughing, ear pain, neck pain, rhinorrhea, a sore throat, swollen glands and wheezing. Pertinent negatives include no abdominal pain, headaches, nausea, sinus pain or sneezing. She has tried decongestant for the symptoms. The treatment provided mild relief.   Patient was seen for this on 01/17/2019 and treated with azithromycin by her PCP Dr. Rosanna Randy, who wanted her to have a CXR if symptoms persisted.   Allergies  Allergen Reactions  . Shellfish Allergy Anaphylaxis  . Acetaminophen-Codeine   . Clarithromycin     GI upset  . Codeine Phosphate     REACTION: difficulty breathing; vomiting  . Hydrochlorothiazide   . Other     Blue cheese N/V, diarrhea  . Peanuts  [Peanut Oil] Swelling  . Penicillins     REACTION: difficulty breathing; rash; vomiting  . Sulfacetamide Sodium     REACTION: difficulty breathing, rash, vomiting  . Amoxicillin Rash  . Sulfa Antibiotics Rash     Current Outpatient Medications:  .  albuterol (PROVENTIL HFA;VENTOLIN HFA) 108 (90 Base) MCG/ACT inhaler, 1 puff daily as needed, Disp: 1 Inhaler, Rfl: 0 .  ALPRAZolam (XANAX) 0.5 MG tablet, TAKE 1/2 TO 1 TABLET TWICE A DAY AS NEEDED, Disp: 60 tablet, Rfl: 5 .  calcium carbonate (TUMS - DOSED IN MG ELEMENTAL CALCIUM) 500 MG chewable tablet, Chew 1 tablet by mouth as needed for indigestion or heartburn., Disp: , Rfl:  .  carboxymethylcellulose (REFRESH PLUS) 0.5 % SOLN, 1 drop 3 (three) times daily as needed., Disp: , Rfl:  .  diclofenac sodium (VOLTAREN) 1 % GEL, Apply 2 g topically as  needed. , Disp: , Rfl:  .  diphenhydrAMINE (BENADRYL) 25 mg capsule, Take 25 mg by mouth every 6 (six) hours as needed., Disp: , Rfl:  .  EPINEPHrine 0.3 mg/0.3 mL IJ SOAJ injection, Inject into the muscle once., Disp: , Rfl:  .  escitalopram (LEXAPRO) 20 MG tablet, TAKE 1 TABLET (20 MG TOTAL) BY MOUTH DAILY. (Patient taking differently: Take 20 mg by mouth every evening. ), Disp: 90 tablet, Rfl: 3 .  esomeprazole (NEXIUM) 40 MG capsule, TAKE 1 CAPSULE (40 MG TOTAL) BY MOUTH DAILY., Disp: 90 capsule, Rfl: 3 .  metFORMIN (GLUCOPHAGE) 1000 MG tablet, TAKE 1 TABLET (1,000 MG TOTAL) DAILY WITH BREAKFAST BY MOUTH., Disp: 90 tablet, Rfl: 4 .  naproxen sodium (ALEVE) 220 MG tablet, Take 220 mg by mouth daily as needed., Disp: , Rfl:  .  nystatin ointment (MYCOSTATIN), Apply 1 application topically 2 (two) times daily. To affected area (Patient taking differently: Apply 1 application topically as needed. To affected area), Disp: 60 g, Rfl: 1 .  quinapril (ACCUPRIL) 20 MG tablet, TAKE 1 TABLET BY MOUTH EVERY DAY, Disp: 90 tablet, Rfl: 3 .  simvastatin (ZOCOR) 20 MG tablet, TAKE 1 TABLET BY MOUTH EVERYDAY AT BEDTIME, Disp: 30 tablet, Rfl: 11 .  traMADol (ULTRAM) 50 MG tablet, Take 1 tablet (50 mg total) by mouth every 6 (six) hours as needed for up  to 7 doses., Disp: 7 tablet, Rfl: 0 .  Vitamin D, Ergocalciferol, (DRISDOL) 1.25 MG (50000 UT) CAPS capsule, Take 1 capsule (50,000 Units total) by mouth every 7 (seven) days., Disp: 12 capsule, Rfl: 3 .  azithromycin (ZITHROMAX) 250 MG tablet, 2 PO on day 1 then 1 PO daily x 4 days, Disp: 6 tablet, Rfl: 0 .  Multiple Vitamin (MULTIVITAMIN) tablet, Take 1 tablet by mouth as needed. , Disp: , Rfl:   Review of Systems  Constitutional: Positive for fatigue. Negative for activity change, appetite change, chills, diaphoresis, fever and unexpected weight change.  HENT: Positive for congestion, ear pain, nosebleeds, postnasal drip, rhinorrhea and sore throat. Negative for  ear discharge, sinus pressure, sinus pain and sneezing.   Respiratory: Positive for cough, chest tightness, shortness of breath and wheezing. Negative for apnea and choking.   Gastrointestinal: Negative.  Negative for abdominal pain and nausea.  Musculoskeletal: Positive for neck pain.  Neurological: Negative for headaches.  Hematological: Positive for adenopathy.    Social History   Tobacco Use  . Smoking status: Never Smoker  . Smokeless tobacco: Never Used  Substance Use Topics  . Alcohol use: No      Objective:   BP 126/82 (BP Location: Right Arm, Patient Position: Sitting, Cuff Size: Normal)   Pulse 88   Temp 98.5 F (36.9 C) (Oral)   Resp 16   Wt 157 lb (71.2 kg)   LMP 10/25/2009 (Approximate) Comment: tubal ligation  BMI 28.72 kg/m  Vitals:   02/25/19 1531  BP: 126/82  Pulse: 88  Resp: 16  Temp: 98.5 F (36.9 C)  TempSrc: Oral  Weight: 157 lb (71.2 kg)     Physical Exam Cardiovascular:     Rate and Rhythm: Normal rate and regular rhythm.     Pulses: Normal pulses.     Heart sounds: Normal heart sounds.  Pulmonary:     Effort: Pulmonary effort is normal.     Breath sounds: Wheezing present.  Skin:    General: Skin is warm and dry.  Neurological:     Mental Status: She is alert and oriented to person, place, and time. Mental status is at baseline.  Psychiatric:        Mood and Affect: Mood normal.        Behavior: Behavior normal.         Assessment & Plan    1. Cough  I have personally reviewed CXR and agree with findings of normal CXR. Treat for bronchitis as below.  - DG Chest 2 View; Future - albuterol (PROVENTIL HFA;VENTOLIN HFA) 108 (90 Base) MCG/ACT inhaler; Inhale 2 puffs into the lungs every 6 (six) hours as needed for wheezing or shortness of breath.  Dispense: 1 Inhaler; Refill: 2 - predniSONE (DELTASONE) 20 MG tablet; Take 1 tablet (20 mg total) by mouth daily with breakfast for 5 days.  Dispense: 5 tablet; Refill: 0  2.  Bronchitis  - albuterol (PROVENTIL HFA;VENTOLIN HFA) 108 (90 Base) MCG/ACT inhaler; Inhale 2 puffs into the lungs every 6 (six) hours as needed for wheezing or shortness of breath.  Dispense: 1 Inhaler; Refill: 2 - predniSONE (DELTASONE) 20 MG tablet; Take 1 tablet (20 mg total) by mouth daily with breakfast for 5 days.  Dispense: 5 tablet; Refill: 0  The entirety of the information documented in the History of Present Illness, Review of Systems and Physical Exam were personally obtained by me. Portions of this information were initially documented by Lyndel Pleasure, CMA and  reviewed by me for thoroughness and accuracy.   Return if symptoms worsen or fail to improve.         Trinna Post, PA-C  [facility]

## 2019-02-25 NOTE — Telephone Encounter (Signed)
Spoke to pt and scheduled appt with adrianna for 02-25-19

## 2019-02-26 ENCOUNTER — Telehealth: Payer: Self-pay

## 2019-02-26 NOTE — Telephone Encounter (Signed)
-----   Message from Trinna Post, [place] sent at 02/26/2019  9:51 AM EST ----- CXR is normal, can be treated for bronchitis with medications prescribed. Continue inhaler and steroids.

## 2019-02-26 NOTE — Telephone Encounter (Signed)
Patient advised as directed below. 

## 2019-03-04 DIAGNOSIS — Q828 Other specified congenital malformations of skin: Secondary | ICD-10-CM | POA: Diagnosis not present

## 2019-03-04 DIAGNOSIS — H524 Presbyopia: Secondary | ICD-10-CM | POA: Diagnosis not present

## 2019-03-04 DIAGNOSIS — E119 Type 2 diabetes mellitus without complications: Secondary | ICD-10-CM | POA: Diagnosis not present

## 2019-03-04 DIAGNOSIS — H2513 Age-related nuclear cataract, bilateral: Secondary | ICD-10-CM | POA: Diagnosis not present

## 2019-03-04 DIAGNOSIS — E089 Diabetes mellitus due to underlying condition without complications: Secondary | ICD-10-CM | POA: Diagnosis not present

## 2019-03-21 ENCOUNTER — Telehealth: Payer: Self-pay | Admitting: *Deleted

## 2019-03-21 NOTE — Telephone Encounter (Signed)
Called and left the patient a message to call the office back. Need to move her appt from 4/8 to 5/5 due to COVID-19

## 2019-03-21 NOTE — Telephone Encounter (Signed)
Incoming call from pt, rescheduled appt due to Covid-19 risk - rescheduled to May 5 at 1:30 pm. Pt voiced understanding and no other needs per pt at this time.

## 2019-04-03 ENCOUNTER — Ambulatory Visit: Payer: BLUE CROSS/BLUE SHIELD | Admitting: Gynecologic Oncology

## 2019-04-17 ENCOUNTER — Ambulatory Visit: Payer: BLUE CROSS/BLUE SHIELD | Admitting: Obstetrics

## 2019-04-19 ENCOUNTER — Ambulatory Visit: Payer: BLUE CROSS/BLUE SHIELD | Admitting: Obstetrics

## 2019-04-24 ENCOUNTER — Telehealth: Payer: Self-pay | Admitting: *Deleted

## 2019-04-24 NOTE — Telephone Encounter (Signed)
Called and left the patient a message to call the office back. Need to move her appt from 5/5 to June due to COVID

## 2019-04-25 NOTE — Telephone Encounter (Signed)
Called the patient and explained that [place] APP wants her to call either her PCP or GYN for a work up first and then if it shows anything they can refer her to the office. Patient verbalized understanding. Appt moved to 6/2

## 2019-04-25 NOTE — Telephone Encounter (Signed)
Called and spoke with the patient regarding her appt for 5/5. Explained that we needed to cancel and move the appt. Patient stated "I think I have a lump in my breast." Explained that I would talk with [place] APP and call her back.

## 2019-04-30 ENCOUNTER — Ambulatory Visit: Payer: BLUE CROSS/BLUE SHIELD | Admitting: Gynecologic Oncology

## 2019-05-27 NOTE — Progress Notes (Signed)
Consult Note: Gyn-Onc  Hailey Horton 58 y.o. female  CC:  Chief Complaint  Patient presents with  . Vulvar dysplasia    HPI:  She went for her annual exam 07/2018 and HaileyMcComb noted a white lesion on her perineum. 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 She was therefore referred for management and recommendations. On 10/19 she underwent a WLE with Dr. Gerarda Fraction.  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 - NO EVIDENCE OF INVASIVE CARCINOMA.  Interval History:  She last saw Dr. Gerarda Fraction for a postop check October 2019.  She comes in today for vulvar check.  She is overall doing very well and has no significant complaints today.  There are no new medical history issues in her family as she does not have any living family members.  She herself has not had any new diagnoses.  She denies any itching or burning on the vulva.  She denies any vaginal bleeding.  She denies any change in her bowel or bladder habits.  Review of Systems: As above  Current Meds:  Outpatient Encounter Medications as of 05/28/2019  Medication Sig  . albuterol (PROVENTIL HFA;VENTOLIN HFA) 108 (90 Base) MCG/ACT inhaler Inhale 2 puffs into the lungs every 6 (six) hours as needed for wheezing or shortness of breath.  . ALPRAZolam (XANAX) 0.5 MG tablet TAKE 1/2 TO 1 TABLET TWICE A DAY AS NEEDED  . calcium carbonate (TUMS - DOSED IN MG ELEMENTAL CALCIUM) 500 MG chewable tablet Chew 1 tablet by mouth as needed for indigestion or heartburn.  . carboxymethylcellulose (REFRESH PLUS) 0.5 % SOLN 1 drop 3 (three) times daily as needed.  . diclofenac sodium (VOLTAREN) 1 % GEL Apply 2 g topically as needed.   . diphenhydrAMINE (BENADRYL) 25 mg capsule Take 25 mg by mouth every 6 (six) hours as needed.  Marland Kitchen EPINEPHrine  0.3 mg/0.3 mL IJ SOAJ injection Inject into the muscle once.  . escitalopram (LEXAPRO) 20 MG tablet TAKE 1 TABLET (20 MG TOTAL) BY MOUTH DAILY. (Patient taking differently: Take 20 mg by mouth every evening. )  . esomeprazole (NEXIUM) 40 MG capsule TAKE 1 CAPSULE (40 MG TOTAL) BY MOUTH DAILY.  . metFORMIN (GLUCOPHAGE) 1000 MG tablet TAKE 1 TABLET (1,000 MG TOTAL) DAILY WITH BREAKFAST BY MOUTH.  . Multiple Vitamin (MULTIVITAMIN) tablet Take 1 tablet by mouth as needed.   . naproxen sodium (ALEVE) 220 MG tablet Take 220 mg by mouth daily as needed.  . nystatin ointment (MYCOSTATIN) Apply 1 application topically 2 (two) times daily. To affected area (Patient taking differently: Apply 1 application topically as needed. To affected area)  . quinapril (ACCUPRIL) 20 MG tablet TAKE 1 TABLET BY MOUTH EVERY DAY  . simvastatin (ZOCOR) 20 MG tablet TAKE 1 TABLET BY MOUTH EVERYDAY AT BEDTIME  . Vitamin D, Ergocalciferol, (DRISDOL) 1.25 MG (50000 UT) CAPS capsule Take 1 capsule (50,000 Units total) by mouth every 7 (seven) days.  . [DISCONTINUED] azithromycin (ZITHROMAX) 250 MG tablet 2 PO on day 1 then 1 PO daily x 4 days  . [DISCONTINUED] traMADol (ULTRAM) 50 MG tablet Take 1 tablet (50 mg total) by mouth every 6 (six) hours as needed for up to 7 doses.   No facility-administered encounter medications on file as of 05/28/2019.  Allergy:  Allergies  Allergen Reactions  . Shellfish Allergy Anaphylaxis  . Acetaminophen-Codeine   . Clarithromycin     GI upset  . Codeine Phosphate     REACTION: difficulty breathing; vomiting  . Hydrochlorothiazide   . Other     Blue cheese N/V, diarrhea  . Peanuts  [Peanut Oil] Swelling  . Penicillins     REACTION: difficulty breathing; rash; vomiting  . Sulfacetamide Sodium     REACTION: difficulty breathing, rash, vomiting  . Amoxicillin Rash  . Sulfa Antibiotics Rash    Social Hx:   Social History   Socioeconomic History  . Marital status: Married     Spouse name: Not on file  . Number of children: Not on file  . Years of education: Not on file  . Highest education level: Not on file  Occupational History  . Not on file  Social Needs  . Financial resource strain: Not on file  . Food insecurity:    Worry: Not on file    Inability: Not on file  . Transportation needs:    Medical: Not on file    Non-medical: Not on file  Tobacco Use  . Smoking status: Never Smoker  . Smokeless tobacco: Never Used  Substance and Sexual Activity  . Alcohol use: No  . Drug use: No  . Sexual activity: Not on file  Lifestyle  . Physical activity:    Days per week: Not on file    Minutes per session: Not on file  . Stress: Not on file  Relationships  . Social connections:    Talks on phone: Not on file    Gets together: Not on file    Attends religious service: Not on file    Active member of club or organization: Not on file    Attends meetings of clubs or organizations: Not on file    Relationship status: Not on file  . Intimate partner violence:    Fear of current or ex partner: Not on file    Emotionally abused: Not on file    Physically abused: Not on file    Forced sexual activity: Not on file  Other Topics Concern  . Not on file  Social History Narrative  . Not on file    Past Surgical Hx:  Past Surgical History:  Procedure Laterality Date  . COLONOSCOPY  2010  . CRYOTHERAPY    . Right Toe Surgery     Patient states between 2013 and 2014  . TUBAL LIGATION    . VULVECTOMY PARTIAL N/A 10/09/2018   Procedure: VULVECTOMY PARTIAL;  Surgeon: Isabel Caprice, MD;  Location: [facility];  Service: Gynecology;  Laterality: N/A;  . WRIST SURGERY  1994/1979    Past Medical Hx:  Past Medical History:  Diagnosis Date  . Allergy   . Anxiety   . Bronchitis   . Bulging lumbar disc   . DDD (degenerative disc disease), cervical   . Depression   . Diabetes mellitus without complication ([place])   . GERD (gastroesophageal  reflux disease)   . Hepatitis A   . History of anemia    teenager  . History of colon polyps   . History of Lyme disease   . History of [employer] spotted fever   . Hyperlipidemia   . Hypertension   . IBS (irritable bowel syndrome)   . Left leg numbness   . NASH (nonalcoholic steatohepatitis)   . Otitis media    Left  .  PONV (postoperative nausea and vomiting)    prolonged sedation, blurry vision and headache for 6 months after  . Sleep apnea    mild, no machine prescribed at this time  . Tick bite    history of  . TMJ (dislocation of temporomandibular joint)   . VIN II (vulvar intraepithelial neoplasia II)   . Wears glasses     Oncology Hx:   No history exists.    Family Hx:  Family History  Problem Relation Age of Onset  . Cancer Mother   . Heart disease Mother   . Hypertension Mother   . Anemia Mother   . Leukemia Mother   . CVA Mother   . Epilepsy Father   . Heart attack Father   . Diabetes Sister   . Heart disease Sister   . Arthritis Sister   . Cancer Maternal Aunt        breast  . Breast cancer Maternal Aunt     Vitals:  Blood pressure 133/83, pulse 81, temperature (!) 97.4 F (36.3 C), temperature source Oral, resp. rate 18, height 5' 2"  (1.575 m), weight 156 lb 9.6 oz (71 kg), last menstrual period 10/25/2009, SpO2 99 %.  Physical Exam: Well-nourished well-developed female in no acute distress.  Pelvic: External genitalia notable for site of surgical excision on the right inferior vulva just above the anus but not involving the fourchette.  The area is clean without any evidence of recurrent disease.  The entire vulva was inspected.  She does have some small varicosities.  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  There was a positive margin but fortunately she does not appear to have evidence of recurrent disease.  She will return to see [place] in 6 months for a vulvar check.  If at that time her exam is negative we will release  her back to Hailey Horton A., MD 05/28/2019, 12:29 PM

## 2019-05-28 ENCOUNTER — Inpatient Hospital Stay: Payer: BC Managed Care – PPO | Attending: Gynecologic Oncology | Admitting: Gynecologic Oncology

## 2019-05-28 ENCOUNTER — Encounter: Payer: Self-pay | Admitting: Gynecologic Oncology

## 2019-05-28 ENCOUNTER — Other Ambulatory Visit: Payer: Self-pay

## 2019-05-28 VITALS — BP 133/83 | HR 81 | Temp 97.4°F | Resp 18 | Ht 62.0 in | Wt 156.6 lb

## 2019-05-28 DIAGNOSIS — N903 Dysplasia of vulva, unspecified: Secondary | ICD-10-CM

## 2019-05-28 NOTE — Patient Instructions (Addendum)
Please return to see [place] in 6 months.  Please call the office in October 2020 at [phone] to schedule an appointment for December 2020.

## 2019-06-18 ENCOUNTER — Ambulatory Visit (INDEPENDENT_AMBULATORY_CARE_PROVIDER_SITE_OTHER): Payer: BC Managed Care – PPO | Admitting: Family Medicine

## 2019-06-18 ENCOUNTER — Other Ambulatory Visit: Payer: Self-pay

## 2019-06-18 ENCOUNTER — Encounter: Payer: Self-pay | Admitting: Family Medicine

## 2019-06-18 VITALS — BP 102/62 | HR 78 | Temp 98.6°F | Resp 16 | Wt 156.2 lb

## 2019-06-18 DIAGNOSIS — J301 Allergic rhinitis due to pollen: Secondary | ICD-10-CM

## 2019-06-18 DIAGNOSIS — M545 Low back pain: Secondary | ICD-10-CM | POA: Diagnosis not present

## 2019-06-18 DIAGNOSIS — E119 Type 2 diabetes mellitus without complications: Secondary | ICD-10-CM

## 2019-06-18 DIAGNOSIS — I10 Essential (primary) hypertension: Secondary | ICD-10-CM | POA: Diagnosis not present

## 2019-06-18 DIAGNOSIS — F3341 Major depressive disorder, recurrent, in partial remission: Secondary | ICD-10-CM

## 2019-06-18 DIAGNOSIS — G8929 Other chronic pain: Secondary | ICD-10-CM

## 2019-06-18 LAB — POCT GLYCOSYLATED HEMOGLOBIN (HGB A1C): Hemoglobin A1C: 7.6 % — AB (ref 4.0–5.6)

## 2019-06-18 NOTE — Patient Instructions (Signed)
Take Metformin twice.

## 2019-06-18 NOTE — Progress Notes (Signed)
Patient: Hailey Horton Female    DOB: 1961/09/10   58 y.o.   MRN: [id number] Visit Date: 06/18/2019  Today's Provider: Wilhemena Durie, MD   Chief Complaint  Patient presents with  . Diabetes   Subjective:     HPI   Diabetes Mellitus Type II, Follow-up:   Lab Results  Component Value Date   HGBA1C 7.9 (A) 01/15/2019   HGBA1C 7.3 (A) 09/13/2018   HGBA1C 7.0 03/06/2018    Last seen for diabetes 5 months ago.  Management since then includes none. She reports excellent compliance with treatment. She is not having side effects.  Current symptoms include dry mouth and diarrheaand have been unchanged. Home blood sugar records: 102-200  Episodes of hypoglycemia? no   Current Insulin Regimen: n/a Most Recent Eye Exam: 3 months ago Weight trend: stable Prior visit with dietician: No Current exercise: none Current diet habits: well balanced  Pertinent Labs:    Component Value Date/Time   CHOL 173 09/17/2018 0804   TRIG 131 09/17/2018 0804   HDL 45 09/17/2018 0804   LDLCALC 102 (H) 09/17/2018 0804   CREATININE 1.40 (H) 10/09/2018 0826    Wt Readings from Last 3 Encounters:  05/28/19 156 lb 9.6 oz (71 kg)  02/25/19 157 lb (71.2 kg)  01/15/19 156 lb 12.8 oz (71.1 kg)    ------------------------------------------------------------------------   Allergies  Allergen Reactions  . Shellfish Allergy Anaphylaxis  . Acetaminophen-Codeine   . Clarithromycin     GI upset  . Codeine Phosphate     REACTION: difficulty breathing; vomiting  . Hydrochlorothiazide   . Other     Blue cheese N/V, diarrhea  . Peanuts  [Peanut Oil] Swelling  . Penicillins     REACTION: difficulty breathing; rash; vomiting  . Sulfacetamide Sodium     REACTION: difficulty breathing, rash, vomiting  . Amoxicillin Rash  . Sulfa Antibiotics Rash     Current Outpatient Medications:  .  albuterol (PROVENTIL HFA;VENTOLIN HFA) 108 (90 Base) MCG/ACT inhaler, Inhale 2 puffs into the  lungs every 6 (six) hours as needed for wheezing or shortness of breath., Disp: 1 Inhaler, Rfl: 2 .  ALPRAZolam (XANAX) 0.5 MG tablet, TAKE 1/2 TO 1 TABLET TWICE A DAY AS NEEDED, Disp: 60 tablet, Rfl: 5 .  calcium carbonate (TUMS - DOSED IN MG ELEMENTAL CALCIUM) 500 MG chewable tablet, Chew 1 tablet by mouth as needed for indigestion or heartburn., Disp: , Rfl:  .  carboxymethylcellulose (REFRESH PLUS) 0.5 % SOLN, 1 drop 3 (three) times daily as needed., Disp: , Rfl:  .  diclofenac sodium (VOLTAREN) 1 % GEL, Apply 2 g topically as needed. , Disp: , Rfl:  .  diphenhydrAMINE (BENADRYL) 25 mg capsule, Take 25 mg by mouth every 6 (six) hours as needed., Disp: , Rfl:  .  EPINEPHrine 0.3 mg/0.3 mL IJ SOAJ injection, Inject into the muscle once., Disp: , Rfl:  .  escitalopram (LEXAPRO) 20 MG tablet, TAKE 1 TABLET (20 MG TOTAL) BY MOUTH DAILY. (Patient taking differently: Take 20 mg by mouth every evening. ), Disp: 90 tablet, Rfl: 3 .  esomeprazole (NEXIUM) 40 MG capsule, TAKE 1 CAPSULE (40 MG TOTAL) BY MOUTH DAILY., Disp: 90 capsule, Rfl: 3 .  metFORMIN (GLUCOPHAGE) 1000 MG tablet, TAKE 1 TABLET (1,000 MG TOTAL) DAILY WITH BREAKFAST BY MOUTH., Disp: 90 tablet, Rfl: 4 .  Multiple Vitamin (MULTIVITAMIN) tablet, Take 1 tablet by mouth as needed. , Disp: , Rfl:  .  naproxen sodium (ALEVE) 220 MG tablet, Take 220 mg by mouth daily as needed., Disp: , Rfl:  .  nystatin ointment (MYCOSTATIN), Apply 1 application topically 2 (two) times daily. To affected area (Patient taking differently: Apply 1 application topically as needed. To affected area), Disp: 60 g, Rfl: 1 .  quinapril (ACCUPRIL) 20 MG tablet, TAKE 1 TABLET BY MOUTH EVERY DAY, Disp: 90 tablet, Rfl: 3 .  simvastatin (ZOCOR) 20 MG tablet, TAKE 1 TABLET BY MOUTH EVERYDAY AT BEDTIME, Disp: 30 tablet, Rfl: 11 .  Vitamin D, Ergocalciferol, (DRISDOL) 1.25 MG (50000 UT) CAPS capsule, Take 1 capsule (50,000 Units total) by mouth every 7 (seven) days., Disp: 12  capsule, Rfl: 3  Review of Systems  Constitutional: Negative for activity change, appetite change, chills, diaphoresis, fatigue, fever and unexpected weight change.  HENT: Negative.   Eyes: Negative.   Respiratory: Negative for cough and shortness of breath.   Cardiovascular: Negative for chest pain, palpitations and leg swelling.  Endocrine: Negative for cold intolerance, heat intolerance, polydipsia, polyphagia and polyuria.  Musculoskeletal: Positive for arthralgias, back pain and myalgias.       Mild chronic low back pain.  Allergic/Immunologic: Negative for environmental allergies.  Neurological: Negative for dizziness, light-headedness and headaches.  Psychiatric/Behavioral: Negative.   All other systems reviewed and are negative.   Social History   Tobacco Use  . Smoking status: Never Smoker  . Smokeless tobacco: Never Used  Substance Use Topics  . Alcohol use: No      Objective:   LMP 10/25/2009 (Approximate) Comment: tubal ligation There were no vitals filed for this visit.   Physical Exam Vitals signs reviewed.  Constitutional:      Appearance: She is well-developed.  HENT:     Head: Normocephalic and atraumatic.  Eyes:     General: No scleral icterus.    Conjunctiva/sclera: Conjunctivae normal.  Neck:     Thyroid: No thyromegaly.  Cardiovascular:     Rate and Rhythm: Normal rate and regular rhythm.     Heart sounds: Normal heart sounds.  Pulmonary:     Effort: Pulmonary effort is normal.     Breath sounds: Normal breath sounds.  Abdominal:     Palpations: Abdomen is soft.  Musculoskeletal:        General: Tenderness present.     Comments: Mildly tender over right SI joint.  Lymphadenopathy:     Cervical: No cervical adenopathy.  Skin:    General: Skin is warm and dry.  Neurological:     General: No focal deficit present.     Mental Status: She is alert and oriented to person, place, and time.  Psychiatric:        Mood and Affect: Mood normal.         Behavior: Behavior normal.        Thought Content: Thought content normal.        Judgment: Judgment normal.      No results found for any visits on 06/18/19.     Assessment & Plan    1. Type 2 diabetes mellitus without complication, without long-term current use of insulin (HCC) Increase metformin to 1000 BID. - POCT glycosylated hemoglobin (Hb A1C)--7.6 today  2. Essential hypertension   3. Allergic rhinitis due to pollen, unspecified seasonality   4. Chronic right-sided low back pain without sciatica Try Yoga/Pilates. May need PT referral. 5.Chronic depression In partial remission and stable on Lexapro.    Richard Cranford Mon, MD  [facility]  [place] as a scribe for Wilhemena Durie, MD.,have documented all relevant documentation on the behalf of Wilhemena Durie, MD,as directed by  Wilhemena Durie, MD while in the presence of Wilhemena Durie, MD.

## 2019-06-19 ENCOUNTER — Other Ambulatory Visit: Payer: Self-pay | Admitting: Family Medicine

## 2019-06-19 DIAGNOSIS — I10 Essential (primary) hypertension: Secondary | ICD-10-CM

## 2019-07-13 ENCOUNTER — Other Ambulatory Visit: Payer: Self-pay | Admitting: Family Medicine

## 2019-08-22 ENCOUNTER — Other Ambulatory Visit: Payer: Self-pay | Admitting: Family Medicine

## 2019-08-22 DIAGNOSIS — F329 Major depressive disorder, single episode, unspecified: Secondary | ICD-10-CM

## 2019-08-22 DIAGNOSIS — F32A Depression, unspecified: Secondary | ICD-10-CM

## 2019-09-18 ENCOUNTER — Other Ambulatory Visit: Payer: Self-pay

## 2019-09-18 ENCOUNTER — Ambulatory Visit (INDEPENDENT_AMBULATORY_CARE_PROVIDER_SITE_OTHER): Payer: BC Managed Care – PPO | Admitting: Family Medicine

## 2019-09-18 ENCOUNTER — Encounter: Payer: Self-pay | Admitting: Family Medicine

## 2019-09-18 VITALS — BP 116/70 | HR 78 | Temp 97.6°F | Resp 18 | Wt 153.2 lb

## 2019-09-18 DIAGNOSIS — Z23 Encounter for immunization: Secondary | ICD-10-CM

## 2019-09-18 DIAGNOSIS — I10 Essential (primary) hypertension: Secondary | ICD-10-CM

## 2019-09-18 DIAGNOSIS — Z1211 Encounter for screening for malignant neoplasm of colon: Secondary | ICD-10-CM

## 2019-09-18 DIAGNOSIS — E119 Type 2 diabetes mellitus without complications: Secondary | ICD-10-CM | POA: Diagnosis not present

## 2019-09-18 DIAGNOSIS — E7849 Other hyperlipidemia: Secondary | ICD-10-CM

## 2019-09-18 LAB — POCT GLYCOSYLATED HEMOGLOBIN (HGB A1C): Hemoglobin A1C: 7.2 % — AB (ref 4.0–5.6)

## 2019-09-18 NOTE — Progress Notes (Signed)
Patient: Hailey Horton Female    DOB: [birth year]   58 y.o.   MRN: [id number] Visit Date: 09/18/2019  Today's Provider: Wilhemena Durie, MD   No chief complaint on file.  Subjective:     HPI  3 month follow up for diabetes. Medication refill on Alprazolam. Patient is experiencing diarrhea and gas for last few months.  She describes both constipation and some occasional diarrhea.  Diabetes Mellitus Type II, Follow-up:   Lab Results  Component Value Date   HGBA1C 7.2 (A) 09/18/2019   HGBA1C 7.6 (A) 06/18/2019   HGBA1C 7.9 (A) 01/15/2019    Last seen for diabetes 4 months ago.  Management since then includes Metformin.. She reports good compliance with treatment. She is not having side effects.  Current symptoms include none and have been stable. Home blood sugar records: trend: stable  Episodes of hypoglycemia? no   Current insulin regiment: Is not on insulin Most Recent Eye Exam: 1 year. Weight trend: stable Prior visit with dietician: Yes  Current exercise: none Current diet habits: well balanced  Pertinent Labs:    Component Value Date/Time   CHOL 173 09/17/2018 0804   TRIG 131 09/17/2018 0804   HDL 45 09/17/2018 0804   LDLCALC 102 (H) 09/17/2018 0804   CREATININE 1.40 (H) 10/09/2018 0826    Wt Readings from Last 3 Encounters:  09/18/19 153 lb 3.2 oz (69.5 kg)  06/18/19 156 lb 3.2 oz (70.9 kg)  05/28/19 156 lb 9.6 oz (71 kg)    ------------------------------------------------------------------------  Allergies  Allergen Reactions  . Shellfish Allergy Anaphylaxis  . Acetaminophen-Codeine   . Clarithromycin     GI upset  . Codeine Phosphate     REACTION: difficulty breathing; vomiting  . Hydrochlorothiazide   . Other     Blue cheese N/V, diarrhea  . Peanuts  [Peanut Oil] Swelling  . Penicillins     REACTION: difficulty breathing; rash; vomiting  . Sulfacetamide Sodium     REACTION: difficulty breathing, rash, vomiting  .  Amoxicillin Rash  . Sulfa Antibiotics Rash     Current Outpatient Medications:  .  albuterol (PROVENTIL HFA;VENTOLIN HFA) 108 (90 Base) MCG/ACT inhaler, Inhale 2 puffs into the lungs every 6 (six) hours as needed for wheezing or shortness of breath., Disp: 1 Inhaler, Rfl: 2 .  ALPRAZolam (XANAX) 0.5 MG tablet, TAKE 1/2 TO 1 TABLET TWICE A DAY AS NEEDED, Disp: 60 tablet, Rfl: 5 .  calcium carbonate (TUMS - DOSED IN MG ELEMENTAL CALCIUM) 500 MG chewable tablet, Chew 1 tablet by mouth as needed for indigestion or heartburn., Disp: , Rfl:  .  carboxymethylcellulose (REFRESH PLUS) 0.5 % SOLN, 1 drop 3 (three) times daily as needed., Disp: , Rfl:  .  diclofenac sodium (VOLTAREN) 1 % GEL, Apply 2 g topically as needed. , Disp: , Rfl:  .  diphenhydrAMINE (BENADRYL) 25 mg capsule, Take 25 mg by mouth every 6 (six) hours as needed., Disp: , Rfl:  .  EPINEPHrine 0.3 mg/0.3 mL IJ SOAJ injection, Inject into the muscle once., Disp: , Rfl:  .  escitalopram (LEXAPRO) 20 MG tablet, TAKE 1 TABLET (20 MG TOTAL) BY MOUTH DAILY., Disp: 90 tablet, Rfl: 3 .  esomeprazole (NEXIUM) 40 MG capsule, TAKE 1 CAPSULE (40 MG TOTAL) BY MOUTH DAILY., Disp: 90 capsule, Rfl: 3 .  metFORMIN (GLUCOPHAGE) 1000 MG tablet, TAKE 1 TABLET (1,000 MG TOTAL) DAILY WITH BREAKFAST BY MOUTH., Disp: 90 tablet, Rfl: 4 .  Multiple  Vitamin (MULTIVITAMIN) tablet, Take 1 tablet by mouth as needed. , Disp: , Rfl:  .  naproxen sodium (ALEVE) 220 MG tablet, Take 220 mg by mouth daily as needed., Disp: , Rfl:  .  nystatin ointment (MYCOSTATIN), Apply 1 application topically 2 (two) times daily. To affected area (Patient not taking: Reported on 06/18/2019), Disp: 60 g, Rfl: 1 .  quinapril (ACCUPRIL) 20 MG tablet, TAKE 1 TABLET BY MOUTH EVERY DAY, Disp: 90 tablet, Rfl: 3 .  simvastatin (ZOCOR) 20 MG tablet, TAKE 1 TABLET BY MOUTH EVERYDAY AT BEDTIME, Disp: 90 tablet, Rfl: 3 .  Vitamin D, Ergocalciferol, (DRISDOL) 1.25 MG (50000 UT) CAPS capsule, Take 1  capsule (50,000 Units total) by mouth every 7 (seven) days., Disp: 12 capsule, Rfl: 3  Review of Systems  Social History   Tobacco Use  . Smoking status: Never Smoker  . Smokeless tobacco: Never Used  Substance Use Topics  . Alcohol use: No      Objective:   BP 116/70 (BP Location: Left Arm, Patient Position: Sitting, Cuff Size: Normal)   Pulse 78   Temp 97.6 F (36.4 C) (Temporal)   Resp 18   Wt 153 lb 3.2 oz (69.5 kg)   LMP 10/25/2009 (Approximate) Comment: tubal ligation  SpO2 97%   BMI 28.02 kg/m  Vitals:   09/18/19 1435  BP: 116/70  Pulse: 78  Resp: 18  Temp: 97.6 F (36.4 C)  TempSrc: Temporal  SpO2: 97%  Weight: 153 lb 3.2 oz (69.5 kg)  Body mass index is 28.02 kg/m.   Physical Exam   Results for orders placed or performed in visit on 09/18/19  POCT HgB A1C  Result Value Ref Range   Hemoglobin A1C 7.2 (A) 4.0 - 5.6 %   HbA1c POC (<> result, manual entry)     HbA1c, POC (prediabetic range)     HbA1c, POC (controlled diabetic range)         Assessment & Plan    1. Type 2 diabetes mellitus without complication, without long-term current use of insulin (HCC) RTC 4 months.Controlled on metformin. - POCT HgB A1C - CBC w/Diff/Platelet - TSH - Comp. Metabolic Panel (12) - Lipid Profile  2. Need for immunization against influenza  - Flu Vaccine QUAD 36+ mos IM  3. Encounter for screening colonoscopy Last in 2010. - Ambulatory referral to Gastroenterology  4. Essential hypertension Controlled on Quinapril. - CBC w/Diff/Platelet - TSH - Comp. Metabolic Panel (12) - Lipid Profile  5. Other hyperlipidemia On zocor. - Comp. Metabolic Panel (12) - Lipid Profile     Wilhemena Durie, MD  [facility]

## 2019-09-18 NOTE — Patient Instructions (Signed)
Try Metamucil daily

## 2019-09-20 DIAGNOSIS — I10 Essential (primary) hypertension: Secondary | ICD-10-CM | POA: Diagnosis not present

## 2019-09-20 DIAGNOSIS — E7849 Other hyperlipidemia: Secondary | ICD-10-CM | POA: Diagnosis not present

## 2019-09-20 DIAGNOSIS — E119 Type 2 diabetes mellitus without complications: Secondary | ICD-10-CM | POA: Diagnosis not present

## 2019-09-21 LAB — CBC WITH DIFFERENTIAL/PLATELET
Basophils Absolute: 0.1 10*3/uL (ref 0.0–0.2)
Basos: 1 %
EOS (ABSOLUTE): 0.2 10*3/uL (ref 0.0–0.4)
Eos: 3 %
Hematocrit: 38.3 % (ref 34.0–46.6)
Hemoglobin: 12.1 g/dL (ref 11.1–15.9)
Immature Grans (Abs): 0 10*3/uL (ref 0.0–0.1)
Immature Granulocytes: 0 %
Lymphocytes Absolute: 1.9 10*3/uL (ref 0.7–3.1)
Lymphs: 30 %
MCH: 25.4 pg — ABNORMAL LOW (ref 26.6–33.0)
MCHC: 31.6 g/dL (ref 31.5–35.7)
MCV: 81 fL (ref 79–97)
Monocytes Absolute: 0.3 10*3/uL (ref 0.1–0.9)
Monocytes: 5 %
Neutrophils Absolute: 3.8 10*3/uL (ref 1.4–7.0)
Neutrophils: 61 %
Platelets: 337 10*3/uL (ref 150–450)
RBC: 4.76 x10E6/uL (ref 3.77–5.28)
RDW: 14.2 % (ref 11.7–15.4)
WBC: 6.3 10*3/uL (ref 3.4–10.8)

## 2019-09-21 LAB — COMP. METABOLIC PANEL (12)
AST: 33 IU/L (ref 0–40)
Albumin/Globulin Ratio: 1.8 (ref 1.2–2.2)
Albumin: 4.4 g/dL (ref 3.8–4.9)
Alkaline Phosphatase: 118 IU/L — ABNORMAL HIGH (ref 39–117)
BUN/Creatinine Ratio: 22 (ref 9–23)
BUN: 17 mg/dL (ref 6–24)
Bilirubin Total: 0.3 mg/dL (ref 0.0–1.2)
Calcium: 9.9 mg/dL (ref 8.7–10.2)
Chloride: 104 mmol/L (ref 96–106)
Creatinine, Ser: 0.79 mg/dL (ref 0.57–1.00)
GFR calc Af Amer: 95 mL/min/{1.73_m2} (ref >59)
GFR calc non Af Amer: 82 mL/min/{1.73_m2} (ref >59)
Globulin, Total: 2.4 g/dL (ref 1.5–4.5)
Glucose: 131 mg/dL — ABNORMAL HIGH (ref 65–99)
Potassium: 4.5 mmol/L (ref 3.5–5.2)
Sodium: 141 mmol/L (ref 134–144)
Total Protein: 6.8 g/dL (ref 6.0–8.5)

## 2019-09-21 LAB — LIPID PANEL
Chol/HDL Ratio: 3.9 ratio (ref 0.0–4.4)
Cholesterol, Total: 190 mg/dL (ref 100–199)
HDL: 49 mg/dL (ref >39)
LDL Chol Calc (NIH): 105 mg/dL — ABNORMAL HIGH (ref 0–99)
Triglycerides: 209 mg/dL — ABNORMAL HIGH (ref 0–149)
VLDL Cholesterol Cal: 36 mg/dL (ref 5–40)

## 2019-09-21 LAB — TSH: TSH: 2.79 u[IU]/mL (ref 0.450–4.500)

## 2019-09-24 ENCOUNTER — Telehealth: Payer: Self-pay

## 2019-09-24 NOTE — Telephone Encounter (Signed)
-----   Message from Jerrol Banana., MD sent at 09/24/2019  1:10 PM EDT ----- Labs better but due to diabetes patient should be on cholesterol treatment.  Start rosuvastatin 10 mg daily.  Recheck 1 month.

## 2019-09-24 NOTE — Telephone Encounter (Signed)
LMTCB

## 2019-09-26 NOTE — Telephone Encounter (Signed)
LVMTCB regarding lab results.

## 2019-09-27 NOTE — Telephone Encounter (Signed)
Pt called back regarding labs.  Please call pt back.  Thanks, [employer]

## 2019-09-30 NOTE — Telephone Encounter (Signed)
Pt needs a nurse to return her call about her labs  Teri.

## 2019-10-01 ENCOUNTER — Ambulatory Visit (INDEPENDENT_AMBULATORY_CARE_PROVIDER_SITE_OTHER): Payer: BC Managed Care – PPO | Admitting: Family Medicine

## 2019-10-01 VITALS — Temp 98.3°F

## 2019-10-01 DIAGNOSIS — J069 Acute upper respiratory infection, unspecified: Secondary | ICD-10-CM

## 2019-10-01 DIAGNOSIS — R05 Cough: Secondary | ICD-10-CM | POA: Diagnosis not present

## 2019-10-01 DIAGNOSIS — R059 Cough, unspecified: Secondary | ICD-10-CM

## 2019-10-01 NOTE — Progress Notes (Signed)
Patient: Hailey Horton Female    DOB: 14-Mar-1961   58 y.o.   MRN: [id number] Visit Date: 10/01/2019  Today's Provider: Wilhemena Durie, MD   Chief Complaint  Patient presents with  . Fever  . Cough   Subjective:     HPI  Patient states that she has been experiencing fever, chills, body aches, and congested cough that started on Friday. She states that she took her nephew's wife to be tested for Covid 19 and did not wear a mask while riding in the car. Her niece's first test was inconclusive and the second was negative. Patient is taking OTC cough syrup, but it has not helped.  She has chest soreness but no shortness of breath.  She is also sore in her back from coughing.  Virtual Visit via Telephone Note  I connected with Hailey Horton on 10/01/19 at  2:40 PM EDT by telephone and verified that I am speaking with the correct person using two identifiers.  I discussed the limitations, risks, security and privacy concerns of performing an evaluation and management service by telephone and the availability of in person appointments. I also discussed with the patient that there may be a patient responsible charge related to this service. The patient expressed understanding and agreed to proceed.  I discussed the assessment and treatment plan with the patient. The patient was provided an opportunity to ask questions and all were answered. The patient agreed with the plan and demonstrated an understanding of the instructions.   The patient was advised to call back or seek an in-person evaluation if the symptoms worsen or if the condition fails to improve as anticipated.    Allergies  Allergen Reactions  . Shellfish Allergy Anaphylaxis  . Acetaminophen-Codeine   . Clarithromycin     GI upset  . Codeine Phosphate     REACTION: difficulty breathing; vomiting  . Hydrochlorothiazide   . Other     Blue cheese N/V, diarrhea  . Peanuts  [Peanut Oil] Swelling  . Penicillins      REACTION: difficulty breathing; rash; vomiting  . Sulfacetamide Sodium     REACTION: difficulty breathing, rash, vomiting  . Amoxicillin Rash  . Sulfa Antibiotics Rash     Current Outpatient Medications:  .  albuterol (PROVENTIL HFA;VENTOLIN HFA) 108 (90 Base) MCG/ACT inhaler, Inhale 2 puffs into the lungs every 6 (six) hours as needed for wheezing or shortness of breath., Disp: 1 Inhaler, Rfl: 2 .  ALPRAZolam (XANAX) 0.5 MG tablet, TAKE 1/2 TO 1 TABLET TWICE A DAY AS NEEDED, Disp: 60 tablet, Rfl: 5 .  calcium carbonate (TUMS - DOSED IN MG ELEMENTAL CALCIUM) 500 MG chewable tablet, Chew 1 tablet by mouth as needed for indigestion or heartburn., Disp: , Rfl:  .  carboxymethylcellulose (REFRESH PLUS) 0.5 % SOLN, 1 drop 3 (three) times daily as needed., Disp: , Rfl:  .  diclofenac sodium (VOLTAREN) 1 % GEL, Apply 2 g topically as needed. , Disp: , Rfl:  .  diphenhydrAMINE (BENADRYL) 25 mg capsule, Take 25 mg by mouth every 6 (six) hours as needed., Disp: , Rfl:  .  EPINEPHrine 0.3 mg/0.3 mL IJ SOAJ injection, Inject into the muscle once., Disp: , Rfl:  .  escitalopram (LEXAPRO) 20 MG tablet, TAKE 1 TABLET (20 MG TOTAL) BY MOUTH DAILY., Disp: 90 tablet, Rfl: 3 .  esomeprazole (NEXIUM) 40 MG capsule, TAKE 1 CAPSULE (40 MG TOTAL) BY MOUTH DAILY., Disp: 90 capsule, Rfl:  3 .  metFORMIN (GLUCOPHAGE) 1000 MG tablet, TAKE 1 TABLET (1,000 MG TOTAL) DAILY WITH BREAKFAST BY MOUTH., Disp: 90 tablet, Rfl: 4 .  Multiple Vitamin (MULTIVITAMIN) tablet, Take 1 tablet by mouth as needed. , Disp: , Rfl:  .  naproxen sodium (ALEVE) 220 MG tablet, Take 220 mg by mouth daily as needed., Disp: , Rfl:  .  nystatin ointment (MYCOSTATIN), Apply 1 application topically 2 (two) times daily. To affected area (Patient not taking: Reported on 06/18/2019), Disp: 60 g, Rfl: 1 .  quinapril (ACCUPRIL) 20 MG tablet, TAKE 1 TABLET BY MOUTH EVERY DAY, Disp: 90 tablet, Rfl: 3 .  simvastatin (ZOCOR) 20 MG tablet, TAKE 1 TABLET BY  MOUTH EVERYDAY AT BEDTIME, Disp: 90 tablet, Rfl: 3 .  Vitamin D, Ergocalciferol, (DRISDOL) 1.25 MG (50000 UT) CAPS capsule, Take 1 capsule (50,000 Units total) by mouth every 7 (seven) days., Disp: 12 capsule, Rfl: 3  Review of Systems  Constitutional: Positive for chills and fever.  Respiratory: Positive for cough.   Cardiovascular: Negative.   Musculoskeletal: Positive for back pain.  Psychiatric/Behavioral: Negative.     Social History   Tobacco Use  . Smoking status: Never Smoker  . Smokeless tobacco: Never Used  Substance Use Topics  . Alcohol use: No      Objective:   Temp 98.3 F (36.8 C)   LMP 10/25/2009 (Approximate) Comment: tubal ligation Vitals:   10/01/19 1413  Temp: 98.3 F (36.8 C)  There is no height or weight on file to calculate BMI.   Physical Exam   No results found for any visits on 10/01/19. Phone call lasted about 8 minutes.    Assessment & Plan    1. Cough I have told her to try Robitussin or Robitussin-DM as an expectorant for the cough.  After COVID obtain if it is negative we will see her back for routine testing if necessary. - Novel Coronavirus, NAA ([facility])  2. Viral URI Discussed fluids Robitussin and Tylenol use.  Routine follow-up.      Richard Cranford Mon, MD  [facility]

## 2019-10-02 ENCOUNTER — Encounter: Payer: Self-pay | Admitting: *Deleted

## 2019-10-02 ENCOUNTER — Other Ambulatory Visit: Payer: Self-pay | Admitting: *Deleted

## 2019-10-02 DIAGNOSIS — Z20828 Contact with and (suspected) exposure to other viral communicable diseases: Secondary | ICD-10-CM | POA: Diagnosis not present

## 2019-10-02 DIAGNOSIS — Z20822 Contact with and (suspected) exposure to covid-19: Secondary | ICD-10-CM

## 2019-10-03 LAB — NOVEL CORONAVIRUS, NAA: SARS-CoV-2, NAA: NOT DETECTED

## 2019-11-08 ENCOUNTER — Telehealth: Payer: Self-pay | Admitting: Family Medicine

## 2019-11-08 ENCOUNTER — Telehealth: Payer: Self-pay | Admitting: *Deleted

## 2019-11-08 NOTE — Telephone Encounter (Signed)
calld and spoke with patient. She denies any other symptoms other then having a headache. No Loss of taste at this time and is negative for covid screen. She has an appointment to come into the office Wednesday with Dr.Gilbert.

## 2019-11-08 NOTE — Telephone Encounter (Signed)
Scheduled the patient for a follow up in Jan

## 2019-11-08 NOTE — Telephone Encounter (Signed)
Pt calling because she keeps having headaches in the top and back of neck Doesn't have much taste since the last time she was tested for COVID 1 month ago. Her husband as been sick with Broncithis.  Pt wants to come in for a visit for her headaches.  Please call pt back at [phone] to advise.  Thanks, [employer]

## 2019-11-13 ENCOUNTER — Ambulatory Visit: Payer: BC Managed Care – PPO | Admitting: Family Medicine

## 2019-12-10 ENCOUNTER — Other Ambulatory Visit: Payer: Self-pay | Admitting: Family Medicine

## 2019-12-10 DIAGNOSIS — K219 Gastro-esophageal reflux disease without esophagitis: Secondary | ICD-10-CM

## 2020-01-01 ENCOUNTER — Ambulatory Visit: Payer: BC Managed Care – PPO | Admitting: Obstetrics & Gynecology

## 2020-01-09 ENCOUNTER — Telehealth: Payer: Self-pay | Admitting: *Deleted

## 2020-01-09 NOTE — Telephone Encounter (Signed)
Moved the patient's appt from the afternoon to the morning

## 2020-01-13 ENCOUNTER — Telehealth: Payer: Self-pay | Admitting: *Deleted

## 2020-01-13 NOTE — Telephone Encounter (Signed)
Patient called and cancel appt due to COVID exposure. Patient will call back to reschedule

## 2020-01-15 ENCOUNTER — Ambulatory Visit: Payer: Self-pay | Admitting: Obstetrics & Gynecology

## 2020-01-15 ENCOUNTER — Encounter: Payer: Self-pay | Admitting: Family Medicine

## 2020-02-02 ENCOUNTER — Other Ambulatory Visit: Payer: Self-pay | Admitting: Family Medicine

## 2020-02-02 DIAGNOSIS — E119 Type 2 diabetes mellitus without complications: Secondary | ICD-10-CM

## 2020-02-02 NOTE — Telephone Encounter (Signed)
Requested Prescriptions  Pending Prescriptions Disp Refills  . metFORMIN (GLUCOPHAGE) 1000 MG tablet [Pharmacy Med Name: METFORMIN HCL 1,000 MG TABLET] 90 tablet 0    Sig: TAKE 1 TABLET (1,000 MG TOTAL) DAILY WITH BREAKFAST BY MOUTH.     Endocrinology:  Diabetes - Biguanides Passed - 02/02/2020  3:06 PM      Passed - Cr in normal range and within 360 days    Creatinine, Ser  Date Value Ref Range Status  09/20/2019 0.79 0.57 - 1.00 mg/dL Final         Passed - HBA1C is between 0 and 7.9 and within 180 days    Hemoglobin A1C  Date Value Ref Range Status  09/18/2019 7.2 (A) 4.0 - 5.6 % Final   Hgb A1c MFr Bld  Date Value Ref Range Status  01/10/2017 7.3 (H) 4.8 - 5.6 % Final    Comment:             Pre-diabetes: 5.7 - 6.4          Diabetes: >6.4          Glycemic control for adults with diabetes: <7.0          Passed - eGFR in normal range and within 360 days    GFR calc Af Amer  Date Value Ref Range Status  09/20/2019 95 >59 mL/min/1.73 Final   GFR calc non Af Amer  Date Value Ref Range Status  09/20/2019 82 >59 mL/min/1.73 Final         Passed - Valid encounter within last 6 months    Recent Outpatient Visits          4 months ago Cough   [facility] Jerrol Banana., [facility]   4 months ago Type 2 diabetes mellitus without complication, without long-term current use of insulin [facility])   [facility] Jerrol Banana., MD   7 months ago Type 2 diabetes mellitus without complication, without long-term current use of insulin [facility])   [facility] Jerrol Banana., MD   11 months ago Cough   [facility], Fabio Bering M, [place]   1 year ago Type 2 diabetes mellitus without complication, without long-term current use of insulin [facility])   [facility] Jerrol Banana., MD

## 2020-03-08 ENCOUNTER — Other Ambulatory Visit: Payer: Self-pay | Admitting: Family Medicine

## 2020-03-08 DIAGNOSIS — K219 Gastro-esophageal reflux disease without esophagitis: Secondary | ICD-10-CM

## 2020-03-08 NOTE — Telephone Encounter (Signed)
Requested Prescriptions  Pending Prescriptions Disp Refills  . esomeprazole (NEXIUM) 40 MG capsule [Pharmacy Med Name: ESOMEPRAZOLE MAG DR 40 MG CAP] 90 capsule 2    Sig: TAKE 1 CAPSULE (40 MG TOTAL) BY MOUTH DAILY.     Gastroenterology: Proton Pump Inhibitors Passed - 03/08/2020 12:55 AM      Passed - Valid encounter within last 12 months    Recent Outpatient Visits          5 months ago Cough   [facility] Jerrol Banana., MD   5 months ago Type 2 diabetes mellitus without complication, without long-term current use of insulin [facility])   [facility] Jerrol Banana., MD   8 months ago Type 2 diabetes mellitus without complication, without long-term current use of insulin [facility])   [facility] Jerrol Banana., MD   1 year ago Cough   [facility] Carles Collet M, [place]   1 year ago Type 2 diabetes mellitus without complication, without long-term current use of insulin [facility])   [facility] Jerrol Banana., MD

## 2020-03-17 ENCOUNTER — Other Ambulatory Visit: Payer: Self-pay | Admitting: Family Medicine

## 2020-03-17 DIAGNOSIS — E559 Vitamin D deficiency, unspecified: Secondary | ICD-10-CM

## 2020-03-28 ENCOUNTER — Other Ambulatory Visit: Payer: Self-pay | Admitting: Family Medicine

## 2020-03-28 DIAGNOSIS — E559 Vitamin D deficiency, unspecified: Secondary | ICD-10-CM

## 2020-03-28 NOTE — Telephone Encounter (Signed)
Requested medication (s) are due for refill today: yes  Requested medication (s) are on the active medication list: yes  Last refill:  01/15/19  Future visit scheduled: no  Notes to clinic:  medication not delegated to NT to refill   Requested Prescriptions  Pending Prescriptions Disp Refills   Vitamin D, Ergocalciferol, (DRISDOL) 1.25 MG (50000 UNIT) CAPS capsule [Pharmacy Med Name: VITAMIN D2 1.25MG(50,000 UNIT)] 12 capsule 3    Sig: Take 1 capsule (50,000 Units total) by mouth every 7 (seven) days.      Endocrinology:  Vitamins - Vitamin D Supplementation Failed - 03/28/2020  1:20 PM      Failed - 50,000 IU strengths are not delegated      Failed - Phosphate in normal range and within 360 days    No results found for: PHOS        Failed - Vitamin D in normal range and within 360 days    Vit D, 25-Hydroxy  Date Value Ref Range Status  03/28/2016 29.6 (L) 30.0 - 100.0 ng/mL Final    Comment:    Vitamin D deficiency has been defined by the Institute of Medicine and an Endocrine Society practice guideline as a level of serum 25-OH vitamin D less than 20 ng/mL (1,2). The Endocrine Society went on to further define vitamin D insufficiency as a level between 21 and 29 ng/mL (2). 1. IOM (Institute of Medicine). 2010. Dietary reference    intakes for calcium and D. [place]: The    [employer]. 2. Holick MF, Binkley Marmarth, Bischoff-Ferrari HA, et al.    Evaluation, treatment, and prevention of vitamin D    deficiency: an Endocrine Society clinical practice    guideline. JCEM. 2011 Jul; 96(7):1911-30.           Passed - Ca in normal range and within 360 days    Calcium  Date Value Ref Range Status  09/20/2019 9.9 8.7 - 10.2 mg/dL Final   Calcium, Ion  Date Value Ref Range Status  10/09/2018 1.21 1.15 - 1.40 mmol/L Final          Passed - Valid encounter within last 12 months    Recent Outpatient Visits           5 months ago Cough   [facility] Jerrol Banana., MD   6 months ago Type 2 diabetes mellitus without complication, without long-term current use of insulin [facility])   [facility] Jerrol Banana., MD   9 months ago Type 2 diabetes mellitus without complication, without long-term current use of insulin [facility])   [facility] Jerrol Banana., MD   1 year ago Cough   [facility] Carles Collet M, [place]   1 year ago Type 2 diabetes mellitus without complication, without long-term current use of insulin [facility])   [facility] Jerrol Banana., MD

## 2020-05-04 DIAGNOSIS — Z6828 Body mass index (BMI) 28.0-28.9, adult: Secondary | ICD-10-CM | POA: Diagnosis not present

## 2020-05-04 DIAGNOSIS — Z01419 Encounter for gynecological examination (general) (routine) without abnormal findings: Secondary | ICD-10-CM | POA: Diagnosis not present

## 2020-05-04 DIAGNOSIS — Z1231 Encounter for screening mammogram for malignant neoplasm of breast: Secondary | ICD-10-CM | POA: Diagnosis not present

## 2020-05-30 ENCOUNTER — Other Ambulatory Visit: Payer: Self-pay | Admitting: Family Medicine

## 2020-05-30 DIAGNOSIS — E119 Type 2 diabetes mellitus without complications: Secondary | ICD-10-CM

## 2020-05-30 NOTE — Telephone Encounter (Signed)
Pt overdue for appt- 30 day COURTESY refill given- Attempted to call pt but unable to LM Requested Prescriptions  Pending Prescriptions Disp Refills   metFORMIN (GLUCOPHAGE) 1000 MG tablet [Pharmacy Med Name: METFORMIN HCL 1,000 MG TABLET] 30 tablet 0    Sig: TAKE 1 TABLET (1,000 MG TOTAL) DAILY WITH BREAKFAST BY MOUTH.     Endocrinology:  Diabetes - Biguanides Failed - 05/30/2020  3:19 PM      Failed - HBA1C is between 0 and 7.9 and within 180 days    Hemoglobin A1C  Date Value Ref Range Status  09/18/2019 7.2 (A) 4.0 - 5.6 % Final   Hgb A1c MFr Bld  Date Value Ref Range Status  01/10/2017 7.3 (H) 4.8 - 5.6 % Final    Comment:             Pre-diabetes: 5.7 - 6.4          Diabetes: >6.4          Glycemic control for adults with diabetes: <7.0          Failed - Valid encounter within last 6 months    Recent Outpatient Visits          8 months ago Cough   [facility] Jerrol Banana., MD   8 months ago Type 2 diabetes mellitus without complication, without long-term current use of insulin [facility])   [facility] Jerrol Banana., MD   11 months ago Type 2 diabetes mellitus without complication, without long-term current use of insulin [facility])   [facility] Jerrol Banana., MD   1 year ago Cough   [facility] Carles Collet M, [place]   1 year ago Type 2 diabetes mellitus without complication, without long-term current use of insulin [facility])   [facility] Jerrol Banana[facility] MD             Passed - Cr in normal range and within 360 days    Creatinine, Ser  Date Value Ref Range Status  09/20/2019 0.79 0.57 - 1.00 mg/dL Final         Passed - eGFR in normal range and within 360 days    GFR calc Af Amer  Date Value Ref Range Status  09/20/2019 95 >59 mL/min/1.73 Final   GFR calc non Af Amer  Date Value Ref Range Status  09/20/2019 82 >59 mL/min/1.73 Final

## 2020-06-06 ENCOUNTER — Other Ambulatory Visit: Payer: Self-pay | Admitting: Family Medicine

## 2020-06-06 DIAGNOSIS — I10 Essential (primary) hypertension: Secondary | ICD-10-CM

## 2020-06-06 NOTE — Telephone Encounter (Signed)
Requested  medications are  due for refill today yes  Requested medications are on the active medication list yes  Last refill 3/14  Future visit scheduled no  Notes to clinic Failed protocol due to  no visit within 6 months

## 2020-06-25 ENCOUNTER — Other Ambulatory Visit: Payer: Self-pay | Admitting: Family Medicine

## 2020-06-25 DIAGNOSIS — E559 Vitamin D deficiency, unspecified: Secondary | ICD-10-CM

## 2020-06-25 NOTE — Telephone Encounter (Signed)
Requested  medications are  due for refill today yes  Requested medications are on the active medication list yes  Last refill 4/5  Future visit scheduled no  Notes to clinic Not Delegated

## 2020-06-26 ENCOUNTER — Other Ambulatory Visit: Payer: Self-pay | Admitting: Family Medicine

## 2020-06-26 DIAGNOSIS — E119 Type 2 diabetes mellitus without complications: Secondary | ICD-10-CM

## 2020-06-26 NOTE — Telephone Encounter (Signed)
Requested medication (s) are due for refill today: yes ° °Requested medication (s) are on the active medication list: yes ° °Last refill:  05/30/2020 ° °Future visit scheduled: no ° °Notes to clinic:  courtesy refill given and patient still hasn't schedule follow up ° ° °Requested Prescriptions  °Pending Prescriptions Disp Refills  ° metFORMIN (GLUCOPHAGE) 1000 MG tablet [Pharmacy Med Name: METFORMIN HCL 1,000 MG TABLET] 30 tablet 0  °  Sig: TAKE 1 TABLET (1,000 MG TOTAL) DAILY WITH BREAKFAST BY MOUTH.**NEEDS OFFICE VISIT**  °   ° Endocrinology:  Diabetes - Biguanides Failed - 06/26/2020 12:31 PM  °  °  Failed - HBA1C is between 0 and 7.9 and within 180 days  °  Hemoglobin A1C  °Date Value Ref Range Status  °09/18/2019 7.2 (A) 4.0 - 5.6 % Final  ° °Hgb A1c MFr Bld  °Date Value Ref Range Status  °01/10/2017 7.3 (H) 4.8 - 5.6 % Final  °  Comment:  °           Pre-diabetes: 5.7 - 6.4 °         Diabetes: >6.4 °         Glycemic control for adults with diabetes: <7.0 °  °  °  °  °  Failed - Valid encounter within last 6 months  °  Recent Outpatient Visits   ° °      ° 8 months ago Cough  ° [facility] Gilbert, Richard L Jr., MD  ° 9 months ago Type 2 diabetes mellitus without complication, without long-term current use of insulin (HCC)  ° [facility] Gilbert, Richard L Jr., MD  ° 1 year ago Type 2 diabetes mellitus without complication, without long-term current use of insulin (HCC)  ° [facility] Gilbert, Richard L Jr., MD  ° 1 year ago Cough  ° [facility] Pollak, Adriana M, PA-C  ° 1 year ago Type 2 diabetes mellitus without complication, without long-term current use of insulin (HCC)  °  [facility], Richard L Jr., MD  ° °  °  ° °  °  °  Passed - Cr in normal range and within 360 days  °  Creatinine, Ser  °Date Value Ref Range Status  °09/20/2019 0.79 0.57 - 1.00 mg/dL Final  °  °  °  °  Passed - eGFR in normal range and within 360 days  °   GFR calc Af Amer  °Date Value Ref Range Status  °09/20/2019 95 >59 mL/min/1.73 Final  ° °GFR calc non Af Amer  °Date Value Ref Range Status  °09/20/2019 82 >59 mL/min/1.73 Final  °  °  °  °   ° ° ° ° ° °

## 2020-07-01 ENCOUNTER — Other Ambulatory Visit: Payer: Self-pay | Admitting: Family Medicine

## 2020-07-01 DIAGNOSIS — E559 Vitamin D deficiency, unspecified: Secondary | ICD-10-CM

## 2020-07-01 NOTE — Telephone Encounter (Signed)
Requested medication (s) are due for refill today: yes  Requested medication (s) are on the active medication list:yes   Last refill: 03/30/2020  #12  0 refills  Future visit scheduled no  Notes to clinic:  not delegated  Requested Prescriptions  Pending Prescriptions Disp Refills   Vitamin D, Ergocalciferol, (DRISDOL) 1.25 MG (50000 UNIT) CAPS capsule [Pharmacy Med Name: VITAMIN D2 1.25MG(50,000 UNIT)] 12 capsule 0    Sig: TAKE 1 CAPSULE (50,000 UNITS TOTAL) BY MOUTH EVERY 7 (SEVEN) DAYS.      Endocrinology:  Vitamins - Vitamin D Supplementation Failed - 07/01/2020  3:36 PM      Failed - 50,000 IU strengths are not delegated      Failed - Phosphate in normal range and within 360 days    No results found for: PHOS        Failed - Vitamin D in normal range and within 360 days    Vit D, 25-Hydroxy  Date Value Ref Range Status  03/28/2016 29.6 (L) 30.0 - 100.0 ng/mL Final    Comment:    Vitamin D deficiency has been defined by the Institute of Medicine and an Endocrine Society practice guideline as a level of serum 25-OH vitamin D less than 20 ng/mL (1,2). The Endocrine Society went on to further define vitamin D insufficiency as a level between 21 and 29 ng/mL (2). 1. IOM (Institute of Medicine). 2010. Dietary reference    intakes for calcium and D. [place]: The    [employer]. 2. Holick MF, Binkley [place], Bischoff-Ferrari HA, et al.    Evaluation, treatment, and prevention of vitamin D    deficiency: an Endocrine Society clinical practice    guideline. JCEM. 2011 Jul; 96(7):1911-30.           Passed - Ca in normal range and within 360 days    Calcium  Date Value Ref Range Status  09/20/2019 9.9 8.7 - 10.2 mg/dL Final   Calcium, Ion  Date Value Ref Range Status  10/09/2018 1.21 1.15 - 1.40 mmol/L Final          Passed - Valid encounter within last 12 months    Recent Outpatient Visits           9 months ago Cough   [facility]  Jerrol Banana., MD   9 months ago Type 2 diabetes mellitus without complication, without long-term current use of insulin [facility])   [facility] Jerrol Banana., MD   1 year ago Type 2 diabetes mellitus without complication, without long-term current use of insulin [facility])   [facility] Jerrol Banana., MD   1 year ago Cough   [facility] Carles Collet M, [place]   1 year ago Type 2 diabetes mellitus without complication, without long-term current use of insulin [facility])   [facility] [place] [facility] Jerrol Banana., MD

## 2020-07-10 ENCOUNTER — Telehealth: Payer: Self-pay

## 2020-07-10 NOTE — Telephone Encounter (Signed)
Copied from [place] [id number]. Topic: General - Other >> Jul 10, 2020  3:01 PM Celene Kras wrote: Reason for CRM: Pt called and is requesting to have a A1C lab order placed. Please advise.

## 2020-07-11 NOTE — Telephone Encounter (Signed)
We will get at the time of her appointment.

## 2020-07-13 NOTE — Telephone Encounter (Signed)
Called to advise patient that we will get her a1c at her next visit. Will try to send patient mychart message because phone was busy.

## 2020-07-13 NOTE — Telephone Encounter (Signed)
Patient cancelled appointment for 7/21.

## 2020-07-15 ENCOUNTER — Ambulatory Visit: Payer: Self-pay | Admitting: Family Medicine

## 2020-07-17 ENCOUNTER — Other Ambulatory Visit: Payer: Self-pay | Admitting: Family Medicine

## 2020-07-20 ENCOUNTER — Other Ambulatory Visit: Payer: Self-pay | Admitting: Family Medicine

## 2020-07-20 DIAGNOSIS — K219 Gastro-esophageal reflux disease without esophagitis: Secondary | ICD-10-CM

## 2020-07-20 NOTE — Telephone Encounter (Signed)
esomeprazole (NEXIUM) 40 MG capsule     Patient is requesting a refill.    [id number]  [phone] Fax:  [phone]

## 2020-07-31 ENCOUNTER — Other Ambulatory Visit: Payer: Self-pay | Admitting: Family Medicine

## 2020-07-31 DIAGNOSIS — E119 Type 2 diabetes mellitus without complications: Secondary | ICD-10-CM

## 2020-08-24 ENCOUNTER — Other Ambulatory Visit: Payer: Self-pay | Admitting: Family Medicine

## 2020-08-24 DIAGNOSIS — F32A Depression, unspecified: Secondary | ICD-10-CM

## 2020-08-24 NOTE — Telephone Encounter (Signed)
Requested medication (s) are due for refill today: no  Requested medication (s) are on the active medication list: yes  Last refill:  05/18/2020  Future visit scheduled: no  Notes to clinic: overdue for follow up  Patient canceled las appointment    Requested Prescriptions  Pending Prescriptions Disp Refills   escitalopram (LEXAPRO) 20 MG tablet [Pharmacy Med Name: ESCITALOPRAM 20 MG TABLET] 90 tablet 3    Sig: TAKE 1 TABLET (20 MG TOTAL) BY MOUTH DAILY.      Psychiatry:  Antidepressants - SSRI Failed - 08/24/2020  1:33 AM      Failed - Completed PHQ-2 or PHQ-9 in the last 360 days.      Failed - Valid encounter within last 6 months    Recent Outpatient Visits           10 months ago Cough   [facility] Jerrol Banana., MD   11 months ago Type 2 diabetes mellitus without complication, without long-term current use of insulin [facility])   [facility] Jerrol Banana., MD   1 year ago Type 2 diabetes mellitus without complication, without long-term current use of insulin [facility])   [facility] Jerrol Banana., MD   1 year ago Cough   [facility] Carles Collet M, [place]   1 year ago Type 2 diabetes mellitus without complication, without long-term current use of insulin [facility])   [facility] Jerrol Banana., MD

## 2020-08-24 NOTE — Telephone Encounter (Signed)
Patient has not been seen since 10/01/2019. Please advise refill?

## 2020-08-28 NOTE — Progress Notes (Deleted)
Established patient visit   Patient: Hailey Horton   DOB: 10-29-1961   59 y.o. Female  MRN: [id number] Visit Date: 09/02/2020  Today's healthcare provider: Wilhemena Durie, MD   No chief complaint on file.  Subjective    HPI  Hypertension, follow-up  BP Readings from Last 3 Encounters:  09/18/19 116/70  06/18/19 102/62  05/28/19 133/83   Wt Readings from Last 3 Encounters:  09/18/19 153 lb 3.2 oz (69.5 kg)  06/18/19 156 lb 3.2 oz (70.9 kg)  05/28/19 156 lb 9.6 oz (71 kg)     She was last seen for hypertension 12 months ago.  BP at that visit was 116/70. controlled on Quinapril.  She reports {excellent/good/fair/poor:19665} compliance with treatment. She {is/is not:9024} having side effects. {document side effects if present:1} She is following a {diet:21022986} diet. She {is/is not:9024} exercising. She {does/does not:200015} smoke.  Use of agents associated with hypertension: {bp agents assoc with hypertension:511::"none"}.   Outside blood pressures are {***enter patient reported home BP readings, or 'not being checked':1}.   Pertinent labs: Lab Results  Component Value Date   CHOL 190 09/20/2019   HDL 49 09/20/2019   LDLCALC 105 (H) 09/20/2019   TRIG 209 (H) 09/20/2019   CHOLHDL 3.9 09/20/2019   Lab Results  Component Value Date   NA 141 09/20/2019   K 4.5 09/20/2019   CREATININE 0.79 09/20/2019   GFRNONAA 82 09/20/2019   GFRAA 95 09/20/2019   GLUCOSE 131 (H) 09/20/2019     The 10-year ASCVD risk score Mikey Bussing DC Jr., et al., 2013) is: 7.2%   --------------------------------------------------------------------------------------------------- Diabetes Mellitus Type II, Follow-up  Lab Results  Component Value Date   HGBA1C 7.2 (A) 09/18/2019   HGBA1C 7.6 (A) 06/18/2019   HGBA1C 7.9 (A) 01/15/2019   Wt Readings from Last 3 Encounters:  09/18/19 153 lb 3.2 oz (69.5 kg)  06/18/19 156 lb 3.2 oz (70.9 kg)  05/28/19 156 lb 9.6 oz (71 kg)    Last seen for diabetes 12 months ago.  Management since then includes; controlled on metformin. She reports {excellent/good/fair/poor:19665} compliance with treatment. She {is/is not:21021397} having side effects. {document side effects if present:1}   Home blood sugar records: {diabetes glucometry results:16657}  Episodes of hypoglycemia? {Yes/No:20286} {enter symptoms and frequency of symptoms if yes:1}   Current insulin regiment: {enter 'none' or type of insulin and number of units taken with each dose of each insulin formulation that the patient is taking:1} Most Recent Eye Exam: *** {Current exercise:16438:::1} {Current diet habits:16563:::1}  Pertinent Labs: Lab Results  Component Value Date   CHOL 190 09/20/2019   HDL 49 09/20/2019   LDLCALC 105 (H) 09/20/2019   TRIG 209 (H) 09/20/2019   CHOLHDL 3.9 09/20/2019   Lab Results  Component Value Date   NA 141 09/20/2019   K 4.5 09/20/2019   CREATININE 0.79 09/20/2019   GFRNONAA 82 09/20/2019   GFRAA 95 09/20/2019   GLUCOSE 131 (H) 09/20/2019     --------------------------------------------------------------------------------------------------- Lipid/Cholesterol, Follow-up  Last lipid panel Other pertinent labs  Lab Results  Component Value Date   CHOL 190 09/20/2019   HDL 49 09/20/2019   LDLCALC 105 (H) 09/20/2019   TRIG 209 (H) 09/20/2019   CHOLHDL 3.9 09/20/2019   Lab Results  Component Value Date   ALT 42 (H) 09/17/2018   AST 33 09/20/2019   PLT 337 09/20/2019   TSH 2.790 09/20/2019     She was last seen for this 12 months ago.  Management since that visit includes no changes, on zocor.  She reports {excellent/good/fair/poor:19665} compliance with treatment. She {is/is not:9024} having side effects. {document side effects if present:1}  Current diet: {diet habits:16563} Current exercise: {exercise types:16438}  The 10-year ASCVD risk score Mikey Bussing DC Jr., et al., 2013) is:  7.2%  ---------------------------------------------------------------------------------------------------   {Show patient history (optional):23778::" "}   Medications: Outpatient Medications Prior to Visit  Medication Sig  . albuterol (PROVENTIL HFA;VENTOLIN HFA) 108 (90 Base) MCG/ACT inhaler Inhale 2 puffs into the lungs every 6 (six) hours as needed for wheezing or shortness of breath.  . ALPRAZolam (XANAX) 0.5 MG tablet TAKE 1/2 TO 1 TABLET TWICE A DAY AS NEEDED  . calcium carbonate (TUMS - DOSED IN MG ELEMENTAL CALCIUM) 500 MG chewable tablet Chew 1 tablet by mouth as needed for indigestion or heartburn.  . carboxymethylcellulose (REFRESH PLUS) 0.5 % SOLN 1 drop 3 (three) times daily as needed.  . diclofenac sodium (VOLTAREN) 1 % GEL Apply 2 g topically as needed.   . diphenhydrAMINE (BENADRYL) 25 mg capsule Take 25 mg by mouth every 6 (six) hours as needed.  Marland Kitchen EPINEPHrine 0.3 mg/0.3 mL IJ SOAJ injection Inject into the muscle once.  . escitalopram (LEXAPRO) 20 MG tablet TAKE 1 TABLET (20 MG TOTAL) BY MOUTH DAILY.  Marland Kitchen esomeprazole (NEXIUM) 40 MG capsule TAKE 1 CAPSULE (40 MG TOTAL) BY MOUTH DAILY.  . metFORMIN (GLUCOPHAGE) 1000 MG tablet TAKE 1 TABLET (1,000 MG TOTAL) DAILY WITH BREAKFAST BY MOUTH.**NEEDS OFFICE VISIT**  . Multiple Vitamin (MULTIVITAMIN) tablet Take 1 tablet by mouth as needed.   . naproxen sodium (ALEVE) 220 MG tablet Take 220 mg by mouth daily as needed.  . nystatin ointment (MYCOSTATIN) Apply 1 application topically 2 (two) times daily. To affected area (Patient not taking: Reported on 06/18/2019)  . quinapril (ACCUPRIL) 20 MG tablet TAKE 1 TABLET BY MOUTH EVERY DAY  . simvastatin (ZOCOR) 20 MG tablet TAKE 1 TABLET BY MOUTH EVERYDAY AT BEDTIME  . Vitamin D, Ergocalciferol, (DRISDOL) 1.25 MG (50000 UNIT) CAPS capsule TAKE 1 CAPSULE (50,000 UNITS TOTAL) BY MOUTH EVERY 7 (SEVEN) DAYS.   No facility-administered medications prior to visit.    Review of Systems  {Heme   Chem  Endocrine  Serology  Results Review (optional):23779::" "}  Objective    LMP 10/25/2009 (Approximate) Comment: tubal ligation {Show previous vital signs (optional):23777::" "}  Physical Exam  ***  No results found for any visits on 09/02/20.  Assessment & Plan     ***  No follow-ups on file.      {provider attestation***:1}   Wilhemena Durie, MD  [facility] [phone] (phone) [phone] (fax)  [place]

## 2020-09-02 ENCOUNTER — Ambulatory Visit: Payer: Self-pay | Admitting: Family Medicine

## 2020-09-06 ENCOUNTER — Other Ambulatory Visit: Payer: Self-pay | Admitting: Family Medicine

## 2020-09-06 DIAGNOSIS — E119 Type 2 diabetes mellitus without complications: Secondary | ICD-10-CM

## 2020-09-06 NOTE — Telephone Encounter (Signed)
Attempted to call pt to make appointment but number is no longer in service.  Last RF 08/03/20 # 30 (courtesy RF)

## 2020-09-14 ENCOUNTER — Other Ambulatory Visit: Payer: Self-pay | Admitting: Family Medicine

## 2020-09-14 DIAGNOSIS — E119 Type 2 diabetes mellitus without complications: Secondary | ICD-10-CM

## 2020-09-15 NOTE — Progress Notes (Signed)
I,Tate Zagal,acting as a scribe for Wilhemena Durie, MD.,have documented all relevant documentation on the behalf of Wilhemena Durie, MD,as directed by  Wilhemena Durie, MD while in the presence of Wilhemena Durie, MD.   Established patient visit   Patient: Hailey Horton   DOB: December 12, 1961   59 y.o. Female  MRN: [id number] Visit Date: 09/16/2020  Today's healthcare provider: Wilhemena Durie, MD   Chief Complaint  Patient presents with  . Diabetes  . Follow-up  . Hyperlipidemia  . Hypertension   Subjective    HPI  Diabetes Mellitus Type II, follow-up  Lab Results  Component Value Date   HGBA1C 7.2 (A) 09/18/2019   HGBA1C 7.6 (A) 06/18/2019   HGBA1C 7.9 (A) 01/15/2019   Last seen for diabetes 09/18/2019.  Management since then includes; RTC 4 months.Controlled on metformin. She reports poor compliance with treatment. She is not having side effects. none  Home blood sugar records: fasting range: not checking  Episodes of hypoglycemia? No none   Current insulin regiment: none Most Recent Eye Exam:due  --------------------------------------------------------------------  Hypertension, follow-up  BP Readings from Last 3 Encounters:  09/16/20 111/75  09/18/19 116/70  06/18/19 102/62   Wt Readings from Last 3 Encounters:  09/16/20 153 lb (69.4 kg)  09/18/19 153 lb 3.2 oz (69.5 kg)  06/18/19 156 lb 3.2 oz (70.9 kg)     She was last seen for hypertension 09/18/2019.  BP at that visit was 116/70. Management since that visit includes; Controlled on Quinapril. She reports good compliance with treatment. She is not having side effects. none She is not exercising. She is not adherent to low salt diet.   Outside blood pressures are occasionally.  She does not smoke.  Use of agents associated with hypertension: Aleve.   --------------------------------------------------------------------  Lipid/Cholesterol, follow-up  Last Lipid Panel: Lab  Results  Component Value Date   CHOL 190 09/20/2019   LDLCALC 105 (H) 09/20/2019   HDL 49 09/20/2019   TRIG 209 (H) 09/20/2019    She was last seen for this 09/18/2019.  Management since that visit includes; On zocor. She reports good compliance with treatment. She is not having side effects. none She is following a Regular diet. Current exercise: none  Last metabolic panel Lab Results  Component Value Date   GLUCOSE 131 (H) 09/20/2019   NA 141 09/20/2019   K 4.5 09/20/2019   BUN 17 09/20/2019   CREATININE 0.79 09/20/2019   GFRNONAA 82 09/20/2019   GFRAA 95 09/20/2019   CALCIUM 9.9 09/20/2019   AST 33 09/20/2019   ALT 42 (H) 09/17/2018   The 10-year ASCVD risk score Mikey Bussing DC Jr., et al., 2013) is: 6.6%  --------------------------------------------------------------------      Medications: Outpatient Medications Prior to Visit  Medication Sig  . albuterol (PROVENTIL HFA;VENTOLIN HFA) 108 (90 Base) MCG/ACT inhaler Inhale 2 puffs into the lungs every 6 (six) hours as needed for wheezing or shortness of breath.  . ALPRAZolam (XANAX) 0.5 MG tablet TAKE 1/2 TO 1 TABLET TWICE A DAY AS NEEDED  . calcium carbonate (TUMS - DOSED IN MG ELEMENTAL CALCIUM) 500 MG chewable tablet Chew 1 tablet by mouth as needed for indigestion or heartburn.  . diphenhydrAMINE (BENADRYL) 25 mg capsule Take 25 mg by mouth every 6 (six) hours as needed.  Marland Kitchen EPINEPHrine 0.3 mg/0.3 mL IJ SOAJ injection Inject into the muscle once.  Marland Kitchen esomeprazole (NEXIUM) 40 MG capsule TAKE 1 CAPSULE (40 MG TOTAL) BY MOUTH  DAILY.  . metFORMIN (GLUCOPHAGE) 1000 MG tablet TAKE 1 TABLET (1,000 MG TOTAL) DAILY WITH BREAKFAST BY MOUTH.**NEEDS OFFICE VISIT**  . Multiple Vitamin (MULTIVITAMIN) tablet Take 1 tablet by mouth as needed.   . quinapril (ACCUPRIL) 20 MG tablet TAKE 1 TABLET BY MOUTH EVERY DAY  . simvastatin (ZOCOR) 20 MG tablet TAKE 1 TABLET BY MOUTH EVERYDAY AT BEDTIME  . carboxymethylcellulose (REFRESH PLUS) 0.5 %  SOLN 1 drop 3 (three) times daily as needed.  . diclofenac sodium (VOLTAREN) 1 % GEL Apply 2 g topically as needed.  (Patient not taking: Reported on 09/16/2020)  . escitalopram (LEXAPRO) 20 MG tablet TAKE 1 TABLET (20 MG TOTAL) BY MOUTH DAILY. (Patient not taking: Reported on 09/16/2020)  . naproxen sodium (ALEVE) 220 MG tablet Take 220 mg by mouth daily as needed.  . nystatin ointment (MYCOSTATIN) Apply 1 application topically 2 (two) times daily. To affected area (Patient not taking: Reported on 06/18/2019)  . Vitamin D, Ergocalciferol, (DRISDOL) 1.25 MG (50000 UNIT) CAPS capsule TAKE 1 CAPSULE (50,000 UNITS TOTAL) BY MOUTH EVERY 7 (SEVEN) DAYS.   No facility-administered medications prior to visit.    Review of Systems  Constitutional: Negative for appetite change, chills, fatigue and fever.  Respiratory: Negative for chest tightness and shortness of breath.   Cardiovascular: Negative for chest pain and palpitations.  Gastrointestinal: Negative for abdominal pain, nausea and vomiting.  Neurological: Negative for dizziness and weakness.    Last hemoglobin A1c Lab Results  Component Value Date   HGBA1C 7.2 (H) 09/16/2020      Objective    BP 111/75 (BP Location: Left Arm, Patient Position: Sitting, Cuff Size: Large)   Pulse 77   Temp 98.6 F (37 C) (Oral)   Resp 16   Ht 5' 2"  (1.575 m)   Wt 153 lb (69.4 kg)   LMP 10/25/2009 (Approximate) Comment: tubal ligation  SpO2 98%   BMI 27.98 kg/m    Physical Exam  BP 111/75 (BP Location: Left Arm, Patient Position: Sitting, Cuff Size: Large)   Pulse 77   Temp 98.6 F (37 C) (Oral)   Resp 16   Ht 5' 2"  (1.575 m)   Wt 153 lb (69.4 kg)   LMP 10/25/2009 (Approximate) Comment: tubal ligation  SpO2 98%   BMI 27.98 kg/m      No results found for any visits on 09/16/20.  Assessment & Plan     1. Type 2 diabetes mellitus without complication, without long-term current use of insulin (HCC)  - CBC w/Diff/Platelet -  Comprehensive Metabolic Panel (CMET) - Lipid panel - TSH - Hemoglobin A1c - VITAMIN D 25 Hydroxy (Vit-D Deficiency, Fractures) - metFORMIN (GLUCOPHAGE) 1000 MG tablet; TAKE 1 TABLET (1,000 MG TOTAL) DAILY WITH BREAKFAST BY MOUTH.  Dispense: 90 tablet; Refill: 3  2. Other hyperlipidemia  - CBC w/Diff/Platelet - Comprehensive Metabolic Panel (CMET) - Lipid panel - TSH - Hemoglobin A1c - VITAMIN D 25 Hydroxy (Vit-D Deficiency, Fractures)  3. Essential hypertension  - CBC w/Diff/Platelet - Comprehensive Metabolic Panel (CMET) - Lipid panel - TSH - Hemoglobin A1c - VITAMIN D 25 Hydroxy (Vit-D Deficiency, Fractures) - quinapril (ACCUPRIL) 20 MG tablet; Take 1 tablet (20 mg total) by mouth daily.  Dispense: 90 tablet; Refill: 3  4. Need for influenza vaccination  - Flu Vaccine QUAD 36+ mos IM  5. Avitaminosis D  - CBC w/Diff/Platelet - Comprehensive Metabolic Panel (CMET) - Lipid panel - TSH - Hemoglobin A1c - VITAMIN D 25 Hydroxy (Vit-D Deficiency, Fractures)  6. Carpal tunnel syndrome of left wrist Wear cock-up wrist splint. - CBC w/Diff/Platelet - Comprehensive Metabolic Panel (CMET) - Lipid panel - TSH - Hemoglobin A1c - VITAMIN D 25 Hydroxy (Vit-D Deficiency, Fractures)  7. Major depressive disorder, remission status unspecified, unspecified whether recurrent  - CBC w/Diff/Platelet - Comprehensive Metabolic Panel (CMET) - Lipid panel - TSH - Hemoglobin A1c - VITAMIN D 25 Hydroxy (Vit-D Deficiency, Fractures)  8. Colon cancer screening  - Cologuard  9. Anxiety    Return in about 4 months (around 01/16/2021).         Richard Cranford Mon, MD  [facility] [phone] (phone) [phone] (fax)  [place]

## 2020-09-16 ENCOUNTER — Other Ambulatory Visit: Payer: Self-pay

## 2020-09-16 ENCOUNTER — Ambulatory Visit: Payer: BC Managed Care – PPO | Admitting: Family Medicine

## 2020-09-16 ENCOUNTER — Other Ambulatory Visit: Payer: Self-pay | Admitting: *Deleted

## 2020-09-16 ENCOUNTER — Encounter: Payer: Self-pay | Admitting: Family Medicine

## 2020-09-16 VITALS — BP 111/75 | HR 77 | Temp 98.6°F | Resp 16 | Ht 62.0 in | Wt 153.0 lb

## 2020-09-16 DIAGNOSIS — F419 Anxiety disorder, unspecified: Secondary | ICD-10-CM

## 2020-09-16 DIAGNOSIS — E559 Vitamin D deficiency, unspecified: Secondary | ICD-10-CM

## 2020-09-16 DIAGNOSIS — I10 Essential (primary) hypertension: Secondary | ICD-10-CM

## 2020-09-16 DIAGNOSIS — E119 Type 2 diabetes mellitus without complications: Secondary | ICD-10-CM

## 2020-09-16 DIAGNOSIS — Z23 Encounter for immunization: Secondary | ICD-10-CM | POA: Diagnosis not present

## 2020-09-16 DIAGNOSIS — E7849 Other hyperlipidemia: Secondary | ICD-10-CM

## 2020-09-16 DIAGNOSIS — Z1211 Encounter for screening for malignant neoplasm of colon: Secondary | ICD-10-CM

## 2020-09-16 DIAGNOSIS — F329 Major depressive disorder, single episode, unspecified: Secondary | ICD-10-CM

## 2020-09-16 DIAGNOSIS — G5602 Carpal tunnel syndrome, left upper limb: Secondary | ICD-10-CM

## 2020-09-16 MED ORDER — QUINAPRIL HCL 20 MG PO TABS: 20.0000 mg | ORAL_TABLET | Freq: Every day | ORAL | 3 refills | Status: DC

## 2020-09-16 MED ORDER — ALPRAZOLAM 0.5 MG PO TABS: ORAL_TABLET | ORAL | 3 refills | Status: DC

## 2020-09-16 MED ORDER — SIMVASTATIN 20 MG PO TABS: ORAL_TABLET | ORAL | 3 refills | Status: DC

## 2020-09-16 MED ORDER — METFORMIN HCL 1000 MG PO TABS: ORAL_TABLET | ORAL | 3 refills | Status: DC

## 2020-09-16 NOTE — Patient Instructions (Signed)
Wear cock-up wrist splint.

## 2020-09-17 LAB — COMPREHENSIVE METABOLIC PANEL
ALT: 47 IU/L — ABNORMAL HIGH (ref 0–32)
AST: 27 IU/L (ref 0–40)
Albumin/Globulin Ratio: 1.6 (ref 1.2–2.2)
Albumin: 4.2 g/dL (ref 3.8–4.9)
Alkaline Phosphatase: 113 IU/L (ref 44–121)
BUN/Creatinine Ratio: 21 (ref 12–28)
BUN: 16 mg/dL (ref 8–27)
Bilirubin Total: 0.2 mg/dL (ref 0.0–1.2)
CO2: 25 mmol/L (ref 20–29)
Calcium: 9.8 mg/dL (ref 8.7–10.3)
Chloride: 102 mmol/L (ref 96–106)
Creatinine, Ser: 0.76 mg/dL (ref 0.57–1.00)
GFR calc Af Amer: 99 mL/min/{1.73_m2} (ref >59)
GFR calc non Af Amer: 86 mL/min/{1.73_m2} (ref >59)
Globulin, Total: 2.7 g/dL (ref 1.5–4.5)
Glucose: 125 mg/dL — ABNORMAL HIGH (ref 65–99)
Potassium: 5 mmol/L (ref 3.5–5.2)
Sodium: 140 mmol/L (ref 134–144)
Total Protein: 6.9 g/dL (ref 6.0–8.5)

## 2020-09-17 LAB — CBC WITH DIFFERENTIAL/PLATELET
Basophils Absolute: 0.1 10*3/uL (ref 0.0–0.2)
Basos: 1 %
EOS (ABSOLUTE): 0.3 10*3/uL (ref 0.0–0.4)
Eos: 4 %
Hematocrit: 38.7 % (ref 34.0–46.6)
Hemoglobin: 11.9 g/dL (ref 11.1–15.9)
Immature Grans (Abs): 0 10*3/uL (ref 0.0–0.1)
Immature Granulocytes: 0 %
Lymphocytes Absolute: 1.6 10*3/uL (ref 0.7–3.1)
Lymphs: 25 %
MCH: 24.9 pg — ABNORMAL LOW (ref 26.6–33.0)
MCHC: 30.7 g/dL — ABNORMAL LOW (ref 31.5–35.7)
MCV: 81 fL (ref 79–97)
Monocytes Absolute: 0.4 10*3/uL (ref 0.1–0.9)
Monocytes: 6 %
Neutrophils Absolute: 4 10*3/uL (ref 1.4–7.0)
Neutrophils: 64 %
Platelets: 330 10*3/uL (ref 150–450)
RBC: 4.78 x10E6/uL (ref 3.77–5.28)
RDW: 14.3 % (ref 11.7–15.4)
WBC: 6.3 10*3/uL (ref 3.4–10.8)

## 2020-09-17 LAB — LIPID PANEL
Chol/HDL Ratio: 4.2 ratio (ref 0.0–4.4)
Cholesterol, Total: 203 mg/dL — ABNORMAL HIGH (ref 100–199)
HDL: 48 mg/dL (ref >39)
LDL Chol Calc (NIH): 122 mg/dL — ABNORMAL HIGH (ref 0–99)
Triglycerides: 185 mg/dL — ABNORMAL HIGH (ref 0–149)
VLDL Cholesterol Cal: 33 mg/dL (ref 5–40)

## 2020-09-17 LAB — VITAMIN D 25 HYDROXY (VIT D DEFICIENCY, FRACTURES): Vit D, 25-Hydroxy: 52.5 ng/mL (ref 30.0–100.0)

## 2020-09-17 LAB — TSH: TSH: 1.87 u[IU]/mL (ref 0.450–4.500)

## 2020-09-17 LAB — HEMOGLOBIN A1C
Est. average glucose Bld gHb Est-mCnc: 160 mg/dL
Hgb A1c MFr Bld: 7.2 % — ABNORMAL HIGH (ref 4.8–5.6)

## 2020-10-13 LAB — COLOGUARD: Cologuard: NEGATIVE

## 2020-12-03 ENCOUNTER — Encounter: Payer: Self-pay | Admitting: Family Medicine

## 2020-12-03 DIAGNOSIS — Z1211 Encounter for screening for malignant neoplasm of colon: Secondary | ICD-10-CM | POA: Diagnosis not present

## 2020-12-12 LAB — COLOGUARD: Cologuard: NEGATIVE

## 2020-12-14 ENCOUNTER — Other Ambulatory Visit: Payer: Self-pay | Admitting: Family Medicine

## 2020-12-14 DIAGNOSIS — F32A Depression, unspecified: Secondary | ICD-10-CM

## 2020-12-16 ENCOUNTER — Telehealth: Payer: Self-pay

## 2020-12-16 NOTE — Telephone Encounter (Signed)
Left detailed message for negative Cologuard result.

## 2021-01-08 ENCOUNTER — Other Ambulatory Visit: Payer: Self-pay

## 2021-01-15 ENCOUNTER — Telehealth: Payer: Self-pay | Admitting: *Deleted

## 2021-01-15 NOTE — Telephone Encounter (Signed)
Copied from [place] [phone]. Topic: General - Other >> Jan 15, 2021  8:38 AM Celene Kras wrote: Reason for CRM: Pts husband calling stating that the pt tested positive for covid on 01/07/21. He states that the pt is having a hard time getting better and out odf bed and is requesting advice. Please advise.

## 2021-01-18 ENCOUNTER — Encounter: Payer: Self-pay | Admitting: Family Medicine

## 2021-01-18 ENCOUNTER — Ambulatory Visit (INDEPENDENT_AMBULATORY_CARE_PROVIDER_SITE_OTHER): Payer: BC Managed Care – PPO | Admitting: Family Medicine

## 2021-01-18 DIAGNOSIS — U071 COVID-19: Secondary | ICD-10-CM | POA: Diagnosis not present

## 2021-01-18 DIAGNOSIS — F32A Depression, unspecified: Secondary | ICD-10-CM

## 2021-01-18 DIAGNOSIS — I10 Essential (primary) hypertension: Secondary | ICD-10-CM | POA: Diagnosis not present

## 2021-01-18 DIAGNOSIS — E119 Type 2 diabetes mellitus without complications: Secondary | ICD-10-CM | POA: Diagnosis not present

## 2021-01-18 NOTE — Progress Notes (Signed)
Virtual telephone visit    Virtual Visit via Telephone Note   This visit type was conducted due to national recommendations for restrictions regarding the COVID-19 Pandemic (e.g. social distancing) in an effort to limit this patient's exposure and mitigate transmission in our community. Due to her co-morbid illnesses, this patient is at least at moderate risk for complications without adequate follow up. This format is felt to be most appropriate for this patient at this time. The patient did not have access to video technology or had technical difficulties with video requiring transitioning to audio format only (telephone). Physical exam was limited to content and character of the telephone converstion.    Patient location: home Provider location: office  I discussed the limitations of evaluation and management by telemedicine and the availability of in person appointments. The patient expressed understanding and agreed to proceed.   Visit Date: 01/18/2021  Today's healthcare provider: Wilhemena Durie, MD   No chief complaint on file.  Subjective    HPI  Positive covid test Jan 13th.  This was a home test the patient started with sore throat and was unable to taste and smell.  He then developed myalgias with a T-max of 99.5.  She still has fatigue and mild cough on occasion but all this is getting better.  She has no shortness of breath.       Medications: Outpatient Medications Prior to Visit  Medication Sig  . albuterol (PROVENTIL HFA;VENTOLIN HFA) 108 (90 Base) MCG/ACT inhaler Inhale 2 puffs into the lungs every 6 (six) hours as needed for wheezing or shortness of breath.  . ALPRAZolam (XANAX) 0.5 MG tablet TAKE 1/2 TO 1 TABLET TWICE A DAY AS NEEDED  . calcium carbonate (TUMS - DOSED IN MG ELEMENTAL CALCIUM) 500 MG chewable tablet Chew 1 tablet by mouth as needed for indigestion or heartburn.  . carboxymethylcellulose (REFRESH PLUS) 0.5 % SOLN 1 drop 3 (three) times  daily as needed.  . diphenhydrAMINE (BENADRYL) 25 mg capsule Take 25 mg by mouth every 6 (six) hours as needed.  Marland Kitchen EPINEPHrine 0.3 mg/0.3 mL IJ SOAJ injection Inject into the muscle once.  . escitalopram (LEXAPRO) 20 MG tablet TAKE 1 TABLET BY MOUTH EVERY DAY  . esomeprazole (NEXIUM) 40 MG capsule TAKE 1 CAPSULE (40 MG TOTAL) BY MOUTH DAILY.  . metFORMIN (GLUCOPHAGE) 1000 MG tablet TAKE 1 TABLET (1,000 MG TOTAL) DAILY WITH BREAKFAST BY MOUTH.  . Multiple Vitamin (MULTIVITAMIN) tablet Take 1 tablet by mouth as needed.   . naproxen sodium (ALEVE) 220 MG tablet Take 220 mg by mouth daily as needed.  . nystatin ointment (MYCOSTATIN) Apply 1 application topically 2 (two) times daily. To affected area  . quinapril (ACCUPRIL) 20 MG tablet Take 1 tablet (20 mg total) by mouth daily.  . simvastatin (ZOCOR) 20 MG tablet TAKE 1 TABLET BY MOUTH EVERYDAY AT BEDTIME  . Vitamin D, Ergocalciferol, (DRISDOL) 1.25 MG (50000 UNIT) CAPS capsule TAKE 1 CAPSULE (50,000 UNITS TOTAL) BY MOUTH EVERY 7 (SEVEN) DAYS.  Marland Kitchen diclofenac sodium (VOLTAREN) 1 % GEL Apply 2 g topically as needed.  (Patient not taking: No sig reported)   No facility-administered medications prior to visit.    Review of Systems  Last hemoglobin A1c Lab Results  Component Value Date   HGBA1C 7.2 (H) 09/16/2020      Objective    LMP 10/25/2009 (Approximate) Comment: tubal ligation BP Readings from Last 3 Encounters:  09/16/20 111/75  09/18/19 116/70  06/18/19 102/62  Wt Readings from Last 3 Encounters:  09/16/20 153 lb (69.4 kg)  09/18/19 153 lb 3.2 oz (69.5 kg)  06/18/19 156 lb 3.2 oz (70.9 kg)     She is able to speak in complete sentences on the phone call.  Alert and oriented in no dyspnea at all.   Assessment & Plan     1. COVID-19 Symptoms are resolving.  Patient encouraged to hydrate but otherwise I think she will continue to improve. Continue over-the-counter medications for any symptoms at all.  2. Type 2 diabetes  mellitus without complication, without long-term current use of insulin ([place]) I will see her back in 1 month for A1c  3. Essential hypertension Follow-up blood pressure  4. Depression, unspecified depression type Clinically stable.  PHQ-9 on next visit.   No follow-ups on file.    I discussed the assessment and treatment plan with the patient. The patient was provided an opportunity to ask questions and all were answered. The patient agreed with the plan and demonstrated an understanding of the instructions.   The patient was advised to call back or seek an in-person evaluation if the symptoms worsen or if the condition fails to improve as anticipated.  I provided 10 minutes of non-face-to-face time during this encounter.    Matthew Pais Cranford Mon, MD [facility] [phone] (phone) [phone] (fax)  [place]

## 2021-01-22 NOTE — Telephone Encounter (Signed)
This was addressed by Dr. Rosanna Randy.

## 2021-01-25 ENCOUNTER — Encounter: Payer: Self-pay | Admitting: Family Medicine

## 2021-02-05 ENCOUNTER — Other Ambulatory Visit: Payer: Self-pay | Admitting: Family Medicine

## 2021-02-05 DIAGNOSIS — K219 Gastro-esophageal reflux disease without esophagitis: Secondary | ICD-10-CM

## 2021-02-05 DIAGNOSIS — F32A Depression, unspecified: Secondary | ICD-10-CM

## 2021-02-05 DIAGNOSIS — E559 Vitamin D deficiency, unspecified: Secondary | ICD-10-CM

## 2021-02-05 NOTE — Telephone Encounter (Signed)
Requested medication (s) are due for refill today: yes  Requested medication (s) are on the active medication list: yes  Last refill:  10/15/2020  Future visit scheduled: no  Notes to clinic:  this refill cannot be delegated    Requested Prescriptions  Pending Prescriptions Disp Refills   Vitamin D, Ergocalciferol, (DRISDOL) 1.25 MG (50000 UNIT) CAPS capsule [Pharmacy Med Name: VITAMIN D2 1.25MG(50,000 UNIT)] 12 capsule 1    Sig: TAKE 1 CAPSULE (50,000 UNITS TOTAL) BY MOUTH EVERY 7 (SEVEN) DAYS.      Endocrinology:  Vitamins - Vitamin D Supplementation Failed - 02/05/2021  9:36 AM      Failed - 50,000 IU strengths are not delegated      Failed - Phosphate in normal range and within 360 days    No results found for: PHOS        Passed - Ca in normal range and within 360 days    Calcium  Date Value Ref Range Status  09/16/2020 9.8 8.7 - 10.3 mg/dL Final   Calcium, Ion  Date Value Ref Range Status  10/09/2018 1.21 1.15 - 1.40 mmol/L Final          Passed - Vitamin D in normal range and within 360 days    Vit D, 25-Hydroxy  Date Value Ref Range Status  09/16/2020 52.5 30.0 - 100.0 ng/mL Final    Comment:    Vitamin D deficiency has been defined by the Institute of Medicine and an Endocrine Society practice guideline as a level of serum 25-OH vitamin D less than 20 ng/mL (1,2). The Endocrine Society went on to further define vitamin D insufficiency as a level between 21 and 29 ng/mL (2). 1. IOM (Institute of Medicine). 2010. Dietary reference    intakes for calcium and D. [place]: The    [employer]. 2. Holick MF, [place], Bischoff-Ferrari HA, et al.    Evaluation, treatment, and prevention of vitamin D    deficiency: an Endocrine Society clinical practice    guideline. JCEM. 2011 Jul; 96(7):1911-30.           Passed - Valid encounter within last 12 months    Recent Outpatient Visits           2 weeks ago [place] Jerrol Banana., MD   4 months ago Type 2 diabetes mellitus without complication, without long-term current use of insulin [facility])   [facility] Jerrol Banana., MD   1 year ago [place]   [facility] Jerrol Banana., MD   1 year ago Type 2 diabetes mellitus without complication, without long-term current use of insulin [facility])   [facility] Jerrol Banana., MD   1 year ago Type 2 diabetes mellitus without complication, without long-term current use of insulin [facility])   [facility] Jerrol Banana., MD                 Signed Prescriptions Disp Refills   esomeprazole (NEXIUM) 40 MG capsule 90 capsule 1    Sig: TAKE 1 CAPSULE (40 MG TOTAL) BY MOUTH DAILY.      Gastroenterology: Proton Pump Inhibitors Passed - 02/05/2021  9:36 AM      Passed - Valid encounter within last 12 months    Recent Outpatient Visits           2 weeks ago [place] Miguel Aschoff  Kaylyn Lim., MD   4 months ago Type 2 diabetes mellitus without complication, without long-term current use of insulin  [facility])   [facility] Jerrol Banana., MD   1 year ago [facility]   [facility] Jerrol Banana[facility] MD   1 year ago Type 2 diabetes mellitus without complication, without long-term current use of insulin [facility])   [facility] Jerrol Banana., MD   1 year ago Type 2 diabetes mellitus without complication, without long-term current use of insulin [facility])   [facility] Jerrol Banana., MD                  escitalopram (LEXAPRO) 20 MG tablet 90 tablet 0    Sig: TAKE 1 TABLET BY MOUTH EVERY DAY      Psychiatry:  Antidepressants - SSRI Failed - 02/05/2021  9:36 AM      Failed - Completed PHQ-2 or PHQ-9 in the last 360 days      Passed - Valid encounter within last 6 months    Recent Outpatient Visits           2  weeks ago [place] Jerrol Banana., MD   4 months ago Type 2 diabetes mellitus without complication, without long-term current use of insulin [facility])   [facility] Jerrol Banana., MD   1 year ago Cough   [facility] Jerrol Banana., MD   1 year ago Type 2 diabetes mellitus without complication, without long-term current use of insulin [facility])   [facility] Jerrol Banana., MD   1 year ago Type 2 diabetes mellitus without complication, without long-term current use of insulin [facility])   [facility] Jerrol Banana., MD

## 2021-04-20 ENCOUNTER — Ambulatory Visit: Payer: BC Managed Care – PPO | Admitting: Family Medicine

## 2021-05-13 ENCOUNTER — Ambulatory Visit: Payer: BC Managed Care – PPO | Admitting: Family Medicine

## 2021-05-30 ENCOUNTER — Other Ambulatory Visit: Payer: Self-pay | Admitting: Family Medicine

## 2021-05-30 DIAGNOSIS — F32A Depression, unspecified: Secondary | ICD-10-CM

## 2021-05-30 NOTE — Telephone Encounter (Signed)
MyChart message sent to pt to make appointment for med refill and follow up. Last RF 02/05/21 #90  Requested Prescriptions  Pending Prescriptions Disp Refills  . escitalopram (LEXAPRO) 20 MG tablet [Pharmacy Med Name: ESCITALOPRAM 20 MG TABLET] 90 tablet 0    Sig: TAKE 1 TABLET BY MOUTH EVERY DAY     Psychiatry:  Antidepressants - SSRI Failed - 05/30/2021 12:52 AM      Failed - Completed PHQ-2 or PHQ-9 in the last 360 days      Failed - Valid encounter within last 6 months    Recent Outpatient Visits          4 months ago [place] Jerrol Banana., MD   8 months ago Type 2 diabetes mellitus without complication, without long-term current use of insulin [facility])   [facility] Jerrol Banana., MD   1 year ago Cough   [facility] Jerrol Banana., MD   1 year ago Type 2 diabetes mellitus without complication, without long-term current use of insulin [facility])   [facility] Jerrol Banana., MD   1 year ago Type 2 diabetes mellitus without complication, without long-term current use of insulin [facility])   [facility] Jerrol Banana., MD

## 2021-07-05 ENCOUNTER — Ambulatory Visit
Admission: RE | Admit: 2021-07-05 | Discharge: 2021-07-05 | Disposition: A | Discharge status: Home / Self Care | Payer: BC Managed Care – PPO | Attending: Family Medicine | Admitting: Family Medicine

## 2021-07-05 ENCOUNTER — Other Ambulatory Visit: Payer: Self-pay

## 2021-07-05 ENCOUNTER — Ambulatory Visit
Admission: RE | Admit: 2021-07-05 | Discharge: 2021-07-05 | Disposition: A | Discharge status: Home / Self Care | Payer: BC Managed Care – PPO | Source: Ambulatory Visit | Attending: Family Medicine | Admitting: Family Medicine

## 2021-07-05 ENCOUNTER — Ambulatory Visit (INDEPENDENT_AMBULATORY_CARE_PROVIDER_SITE_OTHER): Payer: BC Managed Care – PPO | Admitting: Family Medicine

## 2021-07-05 ENCOUNTER — Encounter: Payer: Self-pay | Admitting: Family Medicine

## 2021-07-05 VITALS — BP 119/76 | HR 73 | Resp 18 | Ht 62.0 in | Wt 147.0 lb

## 2021-07-05 DIAGNOSIS — E559 Vitamin D deficiency, unspecified: Secondary | ICD-10-CM

## 2021-07-05 DIAGNOSIS — E119 Type 2 diabetes mellitus without complications: Secondary | ICD-10-CM

## 2021-07-05 DIAGNOSIS — I10 Essential (primary) hypertension: Secondary | ICD-10-CM | POA: Diagnosis not present

## 2021-07-05 DIAGNOSIS — E7849 Other hyperlipidemia: Secondary | ICD-10-CM

## 2021-07-05 DIAGNOSIS — M25522 Pain in left elbow: Secondary | ICD-10-CM | POA: Diagnosis not present

## 2021-07-05 DIAGNOSIS — F32A Depression, unspecified: Secondary | ICD-10-CM

## 2021-07-05 DIAGNOSIS — R600 Localized edema: Secondary | ICD-10-CM | POA: Diagnosis not present

## 2021-07-05 MED ORDER — ESCITALOPRAM OXALATE 20 MG PO TABS: 20.0000 mg | ORAL_TABLET | Freq: Every day | ORAL | 3 refills | Status: DC

## 2021-07-05 MED ORDER — VITAMIN D (ERGOCALCIFEROL) 1.25 MG (50000 UNIT) PO CAPS: 50000.0000 [IU] | ORAL_CAPSULE | ORAL | 3 refills | Status: DC

## 2021-07-05 NOTE — Progress Notes (Signed)
I,April Miller,acting as a scribe for Wilhemena Durie, MD.,have documented all relevant documentation on the behalf of Wilhemena Durie, MD,as directed by  Wilhemena Durie, MD while in the presence of Wilhemena Durie, MD.   Established patient visit   Patient: Hailey Horton   DOB: 01-25-1961   60 y.o. Female  MRN: [id number] Visit Date: 07/05/2021  Today's healthcare provider: Wilhemena Durie, MD   Chief Complaint  Patient presents with   Follow-up   Diabetes   Hypertension   Subjective    HPI  Patient comes in today for follow-up.  She has not been in since last year.  She is not exercising nor watching what she eats very much.  She feels fairly well. 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 She complains of left elbow stiffness and discomfort for the past few weeks without any known trauma.  No neurologic symptoms down her arm or to her hand. Diabetes Mellitus Type II, follow-up  Lab Results  Component Value Date   HGBA1C 7.2 (H) 09/16/2020   HGBA1C 7.2 (A) 09/18/2019   HGBA1C 7.6 (A) 06/18/2019   Last seen for diabetes 10 months ago.  Management since then includes continuing the same treatment. She reports fair compliance with treatment. She is not having side effects. none  Home blood sugar records: fasting range: not checking  Episodes of hypoglycemia? No none   Current insulin regiment: n/a Most Recent Eye Exam: 12/03/2020  ----------------------------------------------------------------------------  Hypertension, follow-up  BP Readings from Last 3 Encounters:  07/05/21 119/76  09/16/20 111/75  09/18/19 116/70   Wt Readings from Last 3 Encounters:  07/05/21 147 lb (66.7 kg)  09/16/20 153 lb (69.4 kg)  09/18/19 153 lb 3.2 oz (69.5 kg)     She was last seen for hypertension 10 months ago.  BP at that visit was 111/75. Management since that visit includes; on quinapril. She  reports good compliance with treatment. She is not having side effects. none She is not exercising. She is not adherent to low salt diet.   Outside blood pressures are not checking.  She does not smoke.  Use of agents associated with hypertension: none.   ----------------------------------------------------------------------------  Lipid/Cholesterol, follow-up  Last Lipid Panel: Lab Results  Component Value Date   CHOL 203 (H) 09/16/2020   LDLCALC 122 (H) 09/16/2020   HDL 48 09/16/2020   TRIG 185 (H) 09/16/2020    She was last seen for this 10 months ago.  Management since that visit includes; labs checked showing-cholesterol too high. Changed simvastatin 20 to rosuvastatin 20 mg daily.  She reports fair compliance with treatment. She is not having side effects. none  She is following a Regular diet. Current exercise: none  Last metabolic panel Lab Results  Component Value Date   GLUCOSE 125 (H) 09/16/2020   NA 140 09/16/2020   K 5.0 09/16/2020   BUN 16 09/16/2020   CREATININE 0.76 09/16/2020   GFRNONAA 86 09/16/2020   GFRAA 99 09/16/2020   CALCIUM 9.8 09/16/2020   AST 27 09/16/2020   ALT 47 (H) 09/16/2020   The 10-year ASCVD risk score Mikey Bussing DC Jr., et al., 2013) is: 8.9%  ----------------------------------------------------------------------------       Medications: Outpatient Medications Prior to Visit  Medication Sig   ALPRAZolam (XANAX) 0.5 MG tablet TAKE 1/2 TO 1 TABLET TWICE A DAY AS NEEDED   calcium carbonate (TUMS - DOSED IN MG ELEMENTAL  CALCIUM) 500 MG chewable tablet Chew 1 tablet by mouth as needed for indigestion or heartburn.   diphenhydrAMINE (BENADRYL) 25 mg capsule Take 25 mg by mouth every 6 (six) hours as needed.   escitalopram (LEXAPRO) 20 MG tablet TAKE 1 TABLET BY MOUTH EVERY DAY   esomeprazole (NEXIUM) 40 MG capsule TAKE 1 CAPSULE (40 MG TOTAL) BY MOUTH DAILY.   metFORMIN (GLUCOPHAGE) 1000 MG tablet TAKE 1 TABLET (1,000 MG TOTAL)  DAILY WITH BREAKFAST BY MOUTH.   naproxen sodium (ALEVE) 220 MG tablet Take 220 mg by mouth daily as needed.   nystatin ointment (MYCOSTATIN) Apply 1 application topically 2 (two) times daily. To affected area   quinapril (ACCUPRIL) 20 MG tablet Take 1 tablet (20 mg total) by mouth daily.   simvastatin (ZOCOR) 20 MG tablet TAKE 1 TABLET BY MOUTH EVERYDAY AT BEDTIME   Vitamin D, Ergocalciferol, (DRISDOL) 1.25 MG (50000 UNIT) CAPS capsule TAKE 1 CAPSULE (50,000 UNITS TOTAL) BY MOUTH EVERY 7 (SEVEN) DAYS.   albuterol (PROVENTIL HFA;VENTOLIN HFA) 108 (90 Base) MCG/ACT inhaler Inhale 2 puffs into the lungs every 6 (six) hours as needed for wheezing or shortness of breath. (Patient not taking: Reported on 07/05/2021)   carboxymethylcellulose (REFRESH PLUS) 0.5 % SOLN 1 drop 3 (three) times daily as needed. (Patient not taking: Reported on 07/05/2021)   diclofenac sodium (VOLTAREN) 1 % GEL Apply 2 g topically as needed.  (Patient not taking: No sig reported)   EPINEPHrine 0.3 mg/0.3 mL IJ SOAJ injection Inject into the muscle once. (Patient not taking: Reported on 07/05/2021)   Multiple Vitamin (MULTIVITAMIN) tablet Take 1 tablet by mouth as needed.  (Patient not taking: Reported on 07/05/2021)   No facility-administered medications prior to visit.    Review of Systems  Constitutional:  Negative for appetite change, chills, fatigue and fever.  Respiratory:  Negative for chest tightness and shortness of breath.   Cardiovascular:  Negative for chest pain and palpitations.  Gastrointestinal:  Negative for abdominal pain, nausea and vomiting.  Neurological:  Negative for dizziness and weakness.       Objective    BP 119/76 (BP Location: Left Arm, Patient Position: Sitting, Cuff Size: Normal)   Pulse 73   Resp 18   Ht 5' 2"  (1.575 m)   Wt 147 lb (66.7 kg)   LMP 10/25/2009 (Approximate) Comment: tubal ligation  SpO2 98%   BMI 26.89 kg/m  BP Readings from Last 3 Encounters:  07/05/21 119/76   09/16/20 111/75  09/18/19 116/70   Wt Readings from Last 3 Encounters:  07/05/21 147 lb (66.7 kg)  09/16/20 153 lb (69.4 kg)  09/18/19 153 lb 3.2 oz (69.5 kg)       Physical Exam Vitals reviewed.  Constitutional:      Appearance: She is well-developed.  HENT:     Head: Normocephalic and atraumatic.  Eyes:     General: No scleral icterus.    Conjunctiva/sclera: Conjunctivae normal.  Neck:     Thyroid: No thyromegaly.  Cardiovascular:     Rate and Rhythm: Normal rate and regular rhythm.     Heart sounds: Normal heart sounds.  Pulmonary:     Effort: Pulmonary effort is normal.     Breath sounds: Normal breath sounds.  Abdominal:     Palpations: Abdomen is soft.  Lymphadenopathy:     Cervical: No cervical adenopathy.  Skin:    General: Skin is warm and dry.  Neurological:     General: No focal deficit present.  Mental Status: She is alert and oriented to person, place, and time.  Psychiatric:        Mood and Affect: Mood normal.        Behavior: Behavior normal.        Thought Content: Thought content normal.        Judgment: Judgment normal.      No results found for any visits on 07/05/21.  Assessment & Plan     1. Type 2 diabetes mellitus without complication, without long-term current use of insulin (HCC) A1c under pretty good control at 7.2.  Continue metformin - Hemoglobin A1c  2. Essential hypertension On quinapril 20 with good control - CBC w/Diff/Platelet - Comprehensive Metabolic Panel (CMET)  3. Other hyperlipidemia On simvastatin 20 - Lipid panel - TSH - Comprehensive Metabolic Panel (CMET)  4. Left elbow pain Obtain x-ray.  May need referral to hand surgeon - DG Elbow Complete Left  5. Avitaminosis D  - Vitamin D, Ergocalciferol, (DRISDOL) 1.25 MG (50000 UNIT) CAPS capsule; Take 1 capsule (50,000 Units total) by mouth every 7 (seven) days.  Dispense: 12 capsule; Refill: 3  6. Clinical depression Clinically stable.  Patient has no  desire to make a change. - escitalopram (LEXAPRO) 20 MG tablet; Take 1 tablet (20 mg total) by mouth daily.  Dispense: 90 tablet; Refill: 3    Return in about 6 months (around 01/05/2022).      I, Wilhemena Durie, MD, have reviewed all documentation for this visit. The documentation on 07/09/21 for the exam, diagnosis, procedures, and orders are all accurate and complete.    Verlan Grotz Cranford Mon, MD  [facility] [phone] (phone) [phone] (fax)  [place]

## 2021-07-05 NOTE — Patient Instructions (Signed)
Try Metamucil daily.

## 2021-07-06 LAB — COMPREHENSIVE METABOLIC PANEL
ALT: 31 IU/L (ref 0–32)
AST: 30 IU/L (ref 0–40)
Albumin/Globulin Ratio: 1.6 (ref 1.2–2.2)
Albumin: 4.3 g/dL (ref 3.8–4.8)
Alkaline Phosphatase: 116 IU/L (ref 44–121)
BUN/Creatinine Ratio: 18 (ref 12–28)
BUN: 16 mg/dL (ref 8–27)
Bilirubin Total: 0.2 mg/dL (ref 0.0–1.2)
CO2: 21 mmol/L (ref 20–29)
Calcium: 9.7 mg/dL (ref 8.7–10.3)
Chloride: 101 mmol/L (ref 96–106)
Creatinine, Ser: 0.87 mg/dL (ref 0.57–1.00)
Globulin, Total: 2.7 g/dL (ref 1.5–4.5)
Glucose: 98 mg/dL (ref 65–99)
Potassium: 4.6 mmol/L (ref 3.5–5.2)
Sodium: 141 mmol/L (ref 134–144)
Total Protein: 7 g/dL (ref 6.0–8.5)
eGFR: 76 mL/min/{1.73_m2} (ref >59)

## 2021-07-06 LAB — CBC WITH DIFFERENTIAL/PLATELET
Basophils Absolute: 0.1 10*3/uL (ref 0.0–0.2)
Basos: 1 %
EOS (ABSOLUTE): 0.2 10*3/uL (ref 0.0–0.4)
Eos: 4 %
Hematocrit: 36.5 % (ref 34.0–46.6)
Hemoglobin: 11.6 g/dL (ref 11.1–15.9)
Immature Grans (Abs): 0 10*3/uL (ref 0.0–0.1)
Immature Granulocytes: 0 %
Lymphocytes Absolute: 1.6 10*3/uL (ref 0.7–3.1)
Lymphs: 26 %
MCH: 24.5 pg — ABNORMAL LOW (ref 26.6–33.0)
MCHC: 31.8 g/dL (ref 31.5–35.7)
MCV: 77 fL — ABNORMAL LOW (ref 79–97)
Monocytes Absolute: 0.3 10*3/uL (ref 0.1–0.9)
Monocytes: 5 %
Neutrophils Absolute: 3.8 10*3/uL (ref 1.4–7.0)
Neutrophils: 64 %
Platelets: 350 10*3/uL (ref 150–450)
RBC: 4.74 x10E6/uL (ref 3.77–5.28)
RDW: 16.1 % — ABNORMAL HIGH (ref 11.7–15.4)
WBC: 5.9 10*3/uL (ref 3.4–10.8)

## 2021-07-06 LAB — LIPID PANEL
Chol/HDL Ratio: 4 ratio (ref 0.0–4.4)
Cholesterol, Total: 205 mg/dL — ABNORMAL HIGH (ref 100–199)
HDL: 51 mg/dL (ref >39)
LDL Chol Calc (NIH): 119 mg/dL — ABNORMAL HIGH (ref 0–99)
Triglycerides: 202 mg/dL — ABNORMAL HIGH (ref 0–149)
VLDL Cholesterol Cal: 35 mg/dL (ref 5–40)

## 2021-07-06 LAB — TSH: TSH: 2.3 u[IU]/mL (ref 0.450–4.500)

## 2021-07-06 LAB — HEMOGLOBIN A1C
Est. average glucose Bld gHb Est-mCnc: 157 mg/dL
Hgb A1c MFr Bld: 7.1 % — ABNORMAL HIGH (ref 4.8–5.6)

## 2021-07-13 ENCOUNTER — Other Ambulatory Visit: Payer: Self-pay | Admitting: *Deleted

## 2021-07-13 DIAGNOSIS — E7849 Other hyperlipidemia: Secondary | ICD-10-CM

## 2021-07-13 MED ORDER — ROSUVASTATIN CALCIUM 40 MG PO TABS: 40.0000 mg | ORAL_TABLET | Freq: Every day | ORAL | 1 refills | Status: DC

## 2021-08-28 ENCOUNTER — Other Ambulatory Visit: Payer: Self-pay | Admitting: Family Medicine

## 2021-08-28 DIAGNOSIS — K219 Gastro-esophageal reflux disease without esophagitis: Secondary | ICD-10-CM

## 2021-08-28 NOTE — Telephone Encounter (Signed)
Requested Prescriptions  Pending Prescriptions Disp Refills  . esomeprazole (NEXIUM) 40 MG capsule [Pharmacy Med Name: ESOMEPRAZOLE MAG DR 40 MG CAP] 90 capsule 1    Sig: TAKE 1 CAPSULE (40 MG TOTAL) BY MOUTH DAILY.     Gastroenterology: Proton Pump Inhibitors Passed - 08/28/2021  2:32 PM      Passed - Valid encounter within last 12 months    Recent Outpatient Visits          1 month ago Type 2 diabetes mellitus without complication, without long-term current use of insulin [facility])   [facility] Jerrol Banana., MD   7 months ago [place] Rosanna Randy, Retia Passe., MD   11 months ago Type 2 diabetes mellitus without complication, without long-term current use of insulin [facility])   [facility] Jerrol Banana., MD   1 year ago Cough   [facility] Jerrol Banana., MD   1 year ago Type 2 diabetes mellitus without complication, without long-term current use of insulin [facility])   [facility] Jerrol Banana., MD      Future Appointments            In 4 months Jerrol Banana., MD [facility]

## 2021-09-27 ENCOUNTER — Other Ambulatory Visit: Payer: Self-pay | Admitting: Family Medicine

## 2021-09-27 DIAGNOSIS — I10 Essential (primary) hypertension: Secondary | ICD-10-CM

## 2021-09-27 NOTE — Telephone Encounter (Signed)
Medication Refill - Medication: quinapril (ACCUPRIL) 20 MG tablet (patient is out )   Has the patient contacted their pharmacy? Yes.    (Agent: If yes, when and what did the pharmacy advise?) Contact PCP office due to patient having no refills    Preferred Pharmacy (with phone number or street name):   [id number] [place] [place]:  [phone] Fax:  [phone]      Has the patient been seen for an appointment in the last year OR does the patient have an upcoming appointment? Yes.    Agent: Please be advised that RX refills may take up to 3 business days. We ask that you follow-up with your pharmacy.

## 2021-09-28 MED ORDER — QUINAPRIL HCL 20 MG PO TABS: 20.0000 mg | ORAL_TABLET | Freq: Every day | ORAL | 1 refills | Status: DC

## 2021-10-10 ENCOUNTER — Other Ambulatory Visit: Payer: Self-pay | Admitting: Family Medicine

## 2021-10-10 DIAGNOSIS — E119 Type 2 diabetes mellitus without complications: Secondary | ICD-10-CM

## 2021-10-10 NOTE — Telephone Encounter (Signed)
Requested Prescriptions  Pending Prescriptions Disp Refills  . metFORMIN (GLUCOPHAGE) 1000 MG tablet [Pharmacy Med Name: METFORMIN HCL 1,000 MG TABLET] 90 tablet 0    Sig: TAKE 1 TABLET (1,000 MG TOTAL) DAILY WITH BREAKFAST BY MOUTH.     Endocrinology:  Diabetes - Biguanides Passed - 10/10/2021  4:35 AM      Passed - Cr in normal range and within 360 days    Creatinine, Ser  Date Value Ref Range Status  07/05/2021 0.87 0.57 - 1.00 mg/dL Final         Passed - HBA1C is between 0 and 7.9 and within 180 days    Hgb A1c MFr Bld  Date Value Ref Range Status  07/05/2021 7.1 (H) 4.8 - 5.6 % Final    Comment:             Prediabetes: 5.7 - 6.4          Diabetes: >6.4          Glycemic control for adults with diabetes: <7.0          Passed - eGFR in normal range and within 360 days    GFR calc Af Amer  Date Value Ref Range Status  09/16/2020 99 >59 mL/min/1.73 Final    Comment:    **[facility] currently reports eGFR in compliance with the current**   recommendations of the [employer]. [facility] will   update reporting as new guidelines are published from the NKF-ASN   Task force.    GFR calc non Af Amer  Date Value Ref Range Status  09/16/2020 86 >59 mL/min/1.73 Final   eGFR  Date Value Ref Range Status  07/05/2021 76 >59 mL/min/1.73 Final         Passed - Valid encounter within last 6 months    Recent Outpatient Visits          3 months ago Type 2 diabetes mellitus without complication, without long-term current use of insulin [facility])   [facility] Jerrol Banana., MD   8 months ago [place] Rosanna Randy, Retia Passe., MD   1 year ago Type 2 diabetes mellitus without complication, without long-term current use of insulin [facility])   [facility] Jerrol Banana., MD   2 years ago Cough   [facility] Jerrol Banana., MD   2 years ago Type 2 diabetes mellitus without  complication, without long-term current use of insulin [facility])   [facility] Jerrol Banana., MD      Future Appointments            In 3 months Jerrol Banana., MD [facility], [facility]

## 2021-11-26 ENCOUNTER — Other Ambulatory Visit: Payer: Self-pay

## 2021-11-26 ENCOUNTER — Encounter: Payer: Self-pay | Admitting: Family Medicine

## 2021-11-26 ENCOUNTER — Ambulatory Visit: Payer: BC Managed Care – PPO | Admitting: Family Medicine

## 2021-11-26 VITALS — BP 151/97 | HR 85 | Temp 98.1°F | Wt 146.0 lb

## 2021-11-26 DIAGNOSIS — R059 Cough, unspecified: Secondary | ICD-10-CM

## 2021-11-26 MED ORDER — MONTELUKAST SODIUM 10 MG PO TABS: 10.0000 mg | ORAL_TABLET | Freq: Every day | ORAL | 0 refills | Status: DC

## 2021-11-26 MED ORDER — BENZONATATE 100 MG PO CAPS: 100.0000 mg | ORAL_CAPSULE | Freq: Three times a day (TID) | ORAL | 0 refills | Status: AC | PRN

## 2021-11-26 NOTE — Progress Notes (Signed)
      Established patient visit   Patient: Hailey Horton   DOB: December 11, 1961   60 y.o. Female  MRN: [id number] Visit Date: 11/26/2021  Today's healthcare provider: Lelon Huh, MD   Chief Complaint  Patient presents with   Cough    Subjective    Cough This is a chronic problem. The current episode started more than 1 month ago (1 month ago). The problem has been waxing and waning. The cough is Productive of sputum. Associated symptoms include wheezing. Pertinent negatives include no ear pain, headaches, postnasal drip, rhinorrhea, sore throat or shortness of breath.       Review of Systems  Constitutional: Negative.   HENT:  Positive for congestion. Negative for ear discharge, ear pain, postnasal drip, rhinorrhea, sinus pressure, sinus pain, sneezing, sore throat and tinnitus.   Respiratory:  Positive for cough and wheezing. Negative for apnea, choking, chest tightness, shortness of breath and stridor.   Gastrointestinal: Negative.   Neurological:  Positive for light-headedness. Negative for dizziness and headaches.      Objective    BP (!) 151/97 (BP Location: Left Arm, Patient Position: Sitting, Cuff Size: Normal)   Pulse 85   Temp 98.1 F (36.7 C) (Oral)   Wt 146 lb (66.2 kg)   LMP 10/25/2009 (Approximate) Comment: tubal ligation  SpO2 98%   BMI 26.70 kg/m    Physical Exam   General Appearance:     Overweight female, alert, cooperative, in no acute distress  HENT:   ENT exam normal, no neck nodes or sinus tenderness  Eyes:    PERRL, conjunctiva/corneas clear, EOM's intact       Lungs:     Clear to auscultation bilaterally, respirations unlabored  Heart:    Normal heart rate. Normal rhythm. No murmurs, rubs, or gallops.    Neurologic:   Awake, alert, oriented x 3. No apparent focal neurological           defect.        Assessment & Plan     1. Cough, unspecified type Likely post viral. Differential diagnosis includes allergies, although there was no  post nasal discharge on exam, and GERD, although she is taking PPI consistently and denies any other reflux sx so long as she takes the PPI.   - benzonatate (TESSALON) 100 MG capsule; Take 1 capsule (100 mg total) by mouth 3 (three) times daily as needed for cough.  Dispense: 30 capsule; Refill: 0 - montelukast (SINGULAIR) 10 MG tablet; Take 1 tablet (10 mg total) by mouth at bedtime.  Dispense: 30 tablet; Refill: 0        The entirety of the information documented in the History of Present Illness, Review of Systems and Physical Exam were personally obtained by me. Portions of this information were initially documented by the CMA and reviewed by me for thoroughness and accuracy.     Lelon Huh, MD  [facility] [phone] (phone) [phone] (fax)  [place]

## 2021-12-18 ENCOUNTER — Other Ambulatory Visit: Payer: Self-pay | Admitting: Family Medicine

## 2021-12-18 NOTE — Telephone Encounter (Signed)
Requested Prescriptions  Pending Prescriptions Disp Refills   montelukast (SINGULAIR) 10 MG tablet [Pharmacy Med Name: MONTELUKAST SOD 10 MG TABLET] 30 tablet 0    Sig: TAKE 1 TABLET BY MOUTH EVERYDAY AT BEDTIME     Pulmonology:  Leukotriene Inhibitors Passed - 12/18/2021  9:35 AM      Passed - Valid encounter within last 12 months    Recent Outpatient Visits          3 weeks ago Cough, unspecified type   [facility] Birdie Sons, MD   5 months ago Type 2 diabetes mellitus without complication, without long-term current use of insulin [facility])   [facility] Jerrol Banana., MD   11 months ago [place] Rosanna Randy, Retia Passe., MD   1 year ago Type 2 diabetes mellitus without complication, without long-term current use of insulin [facility])   [facility] Jerrol Banana., MD   2 years ago Cough   [facility] Jerrol Banana., MD      Future Appointments            In 1 month Jerrol Banana., MD [facility] [place]

## 2021-12-29 ENCOUNTER — Other Ambulatory Visit: Payer: Self-pay | Admitting: Family Medicine

## 2021-12-31 ENCOUNTER — Other Ambulatory Visit: Payer: Self-pay | Admitting: Family Medicine

## 2021-12-31 DIAGNOSIS — I10 Essential (primary) hypertension: Secondary | ICD-10-CM

## 2021-12-31 MED ORDER — QUINAPRIL HCL 20 MG PO TABS: 20.0000 mg | ORAL_TABLET | Freq: Every day | ORAL | 1 refills | Status: DC

## 2021-12-31 NOTE — Telephone Encounter (Unsigned)
Copied from [place] [id number]. Topic: Quick Communication - Rx Refill/Question >> Dec 31, 2021  3:05 PM Yvette Rack wrote: Medication: quinapril (ACCUPRIL) 20 MG tablet  Has the patient contacted their pharmacy? Yes.  Pharmacy informed pt medication is on backorder so pt request it to be sent to [place]  (Agent: If no, request that the patient contact the pharmacy for the refill. If patient does not wish to contact the pharmacy document the reason why and proceed with request.) (Agent: If yes, when and what did the pharmacy advise?)  Preferred Pharmacy (with phone number or street name): [place], [place] Phone: [phone] Fax: [phone]  Has the patient been seen for an appointment in the last year OR does the patient have an upcoming appointment? Yes.    Agent: Please be advised that RX refills may take up to 3 business days. We ask that you follow-up with your pharmacy.

## 2021-12-31 NOTE — Telephone Encounter (Signed)
Requested Prescriptions  Pending Prescriptions Disp Refills   quinapril (ACCUPRIL) 20 MG tablet 90 tablet 1    Sig: Take 1 tablet (20 mg total) by mouth daily.     Cardiovascular:  ACE Inhibitors Failed - 12/31/2021  3:26 PM      Failed - Last BP in normal range    BP Readings from Last 1 Encounters:  11/26/21 (!) 151/97         Passed - Cr in normal range and within 180 days    Creatinine, Ser  Date Value Ref Range Status  07/05/2021 0.87 0.57 - 1.00 mg/dL Final         Passed - K in normal range and within 180 days    Potassium  Date Value Ref Range Status  07/05/2021 4.6 3.5 - 5.2 mmol/L Final         Passed - Patient is not pregnant      Passed - Valid encounter within last 6 months    Recent Outpatient Visits          1 month ago Cough, unspecified type   [facility] Birdie Sons, MD   5 months ago Type 2 diabetes mellitus without complication, without long-term current use of insulin [facility])   [facility] Jerrol Banana., MD   11 months ago [place] Rosanna Randy, Retia Passe., MD   1 year ago Type 2 diabetes mellitus without complication, without long-term current use of insulin [facility])   [facility] Jerrol Banana., MD   2 years ago Cough   [facility] Jerrol Banana., MD      Future Appointments            In 2 weeks Jerrol Banana., MD [facility], [facility]

## 2022-01-01 ENCOUNTER — Other Ambulatory Visit: Payer: Self-pay | Admitting: Family Medicine

## 2022-01-01 DIAGNOSIS — I10 Essential (primary) hypertension: Secondary | ICD-10-CM

## 2022-01-01 NOTE — Telephone Encounter (Signed)
Medication (quinapril) has been recalled. Requesting med change.  Requested Prescriptions  Pending Prescriptions Disp Refills   quinapril (ACCUPRIL) 20 MG tablet [Pharmacy Med Name: QUINAPRIL 20MG      TAB] 90 tablet 1    Sig: TAKE 1 TABLET BY MOUTH ONCE DAILY     Cardiovascular:  ACE Inhibitors Failed - 01/01/2022 12:11 PM      Failed - Last BP in normal range    BP Readings from Last 1 Encounters:  11/26/21 (!) 151/97          Passed - Cr in normal range and within 180 days    Creatinine, Ser  Date Value Ref Range Status  07/05/2021 0.87 0.57 - 1.00 mg/dL Final          Passed - K in normal range and within 180 days    Potassium  Date Value Ref Range Status  07/05/2021 4.6 3.5 - 5.2 mmol/L Final          Passed - Patient is not pregnant      Passed - Valid encounter within last 6 months    Recent Outpatient Visits           1 month ago Cough, unspecified type   [facility] Birdie Sons, MD   6 months ago Type 2 diabetes mellitus without complication, without long-term current use of insulin [facility])   [facility] Jerrol Banana., MD   11 months ago [place] Rosanna Randy, Retia Passe., MD   1 year ago Type 2 diabetes mellitus without complication, without long-term current use of insulin [facility])   [facility] Jerrol Banana., MD   2 years ago Cough   [facility] Jerrol Banana., MD       Future Appointments             In 2 weeks Jerrol Banana., MD [facility], [facility]

## 2022-01-03 ENCOUNTER — Other Ambulatory Visit: Payer: Self-pay | Admitting: Family Medicine

## 2022-01-03 ENCOUNTER — Telehealth: Payer: Self-pay | Admitting: Family Medicine

## 2022-01-03 DIAGNOSIS — I10 Essential (primary) hypertension: Secondary | ICD-10-CM

## 2022-01-03 DIAGNOSIS — E119 Type 2 diabetes mellitus without complications: Secondary | ICD-10-CM

## 2022-01-03 MED ORDER — LISINOPRIL 20 MG PO TABS: 20.0000 mg | ORAL_TABLET | Freq: Every day | ORAL | 1 refills | Status: DC

## 2022-01-03 MED ORDER — METFORMIN HCL 1000 MG PO TABS: ORAL_TABLET | ORAL | 0 refills | Status: DC

## 2022-01-03 NOTE — Telephone Encounter (Signed)
Medication: metFORMIN (GLUCOPHAGE) 1000 MG tablet [[id number]]   Has the patient contacted their pharmacy? YES advised to contact the office (Agent: If no, request that the patient contact the pharmacy for the refill. If patient does not wish to contact the pharmacy document the reason why and proceed with request.) (Agent: If yes, when and what did the pharmacy advise?)  Preferred Pharmacy (with phone number or [facility] name[facility] [place] [place] [place] Has the patient been seen for an appointment in the last year OR does the patient have an upcoming appointment? YES   Agent: Please be advised that RX refills may take up to 3 business days. We ask that you follow-up with your pharmacy.

## 2022-01-03 NOTE — Telephone Encounter (Signed)
Requested Prescriptions  Pending Prescriptions Disp Refills   metFORMIN (GLUCOPHAGE) 1000 MG tablet 90 tablet 0    Sig: TAKE 1 TABLET (1,000 MG TOTAL) DAILY WITH BREAKFAST BY MOUTH. Strength: 1,000 mg     Endocrinology:  Diabetes - Biguanides Failed - 01/03/2022  9:32 AM      Failed - HBA1C is between 0 and 7.9 and within 180 days    Hgb A1c MFr Bld  Date Value Ref Range Status  07/05/2021 7.1 (H) 4.8 - 5.6 % Final    Comment:             Prediabetes: 5.7 - 6.4          Diabetes: >6.4          Glycemic control for adults with diabetes: <7.0          Passed - Cr in normal range and within 360 days    Creatinine, Ser  Date Value Ref Range Status  07/05/2021 0.87 0.57 - 1.00 mg/dL Final         Passed - eGFR in normal range and within 360 days    GFR calc Af Amer  Date Value Ref Range Status  09/16/2020 99 >59 mL/min/1.73 Final    Comment:    **[facility] currently reports eGFR in compliance with the current**   recommendations of the [employer]. [facility] will   update reporting as new guidelines are published from the NKF-ASN   Task force.    GFR calc non Af Amer  Date Value Ref Range Status  09/16/2020 86 >59 mL/min/1.73 Final   eGFR  Date Value Ref Range Status  07/05/2021 76 >59 mL/min/1.73 Final         Passed - Valid encounter within last 6 months    Recent Outpatient Visits          1 month ago Cough, unspecified type   [facility] Birdie Sons, MD   6 months ago Type 2 diabetes mellitus without complication, without long-term current use of insulin [facility])   [facility] Jerrol Banana., MD   11 months ago [place] Rosanna Randy, Retia Passe., MD   1 year ago Type 2 diabetes mellitus without complication, without long-term current use of insulin [facility])   [facility] Jerrol Banana., MD   2 years ago Cough   [facility] Jerrol Banana., MD       Future Appointments            In 3 weeks Merlin, Jake Church, [place], [place]

## 2022-01-03 NOTE — Telephone Encounter (Signed)
Pt is calling to ask can a different medication be called in. The quinapril (ACCUPRIL) 20 MG tablet [[id number]] has been recalled. No pharmacy has it in [place]. Pt has been off of the medication for a week.  Please advise  CB- [id number]

## 2022-01-04 ENCOUNTER — Encounter: Payer: Self-pay | Admitting: Family Medicine

## 2022-01-07 ENCOUNTER — Other Ambulatory Visit: Payer: Self-pay | Admitting: Family Medicine

## 2022-01-07 DIAGNOSIS — E119 Type 2 diabetes mellitus without complications: Secondary | ICD-10-CM

## 2022-01-07 NOTE — Telephone Encounter (Signed)
S[facility] location  Requested Prescriptions  Pending Prescriptions Disp Refills   metFORMIN (GLUCOPHAGE) 1000 MG tablet [Pharmacy Med Name: METFORMIN HCL 1,000 MG TABLET] 90 tablet 0    Sig: TAKE 1 TABLET (1,000 MG TOTAL) DAILY WITH BREAKFAST BY MOUTH.     Endocrinology:  Diabetes - Biguanides Failed - 01/07/2022  1:34 PM      Failed - HBA1C is between 0 and 7.9 and within 180 days    Hgb A1c MFr Bld  Date Value Ref Range Status  07/05/2021 7.1 (H) 4.8 - 5.6 % Final    Comment:             Prediabetes: 5.7 - 6.4          Diabetes: >6.4          Glycemic control for adults with diabetes: <7.0          Passed - Cr in normal range and within 360 days    Creatinine, Ser  Date Value Ref Range Status  07/05/2021 0.87 0.57 - 1.00 mg/dL Final         Passed - eGFR in normal range and within 360 days    GFR calc Af Amer  Date Value Ref Range Status  09/16/2020 99 >59 mL/min/1.73 Final    Comment:    **[facility] currently reports eGFR in compliance with the current**   recommendations of the [employer]. [facility] will   update reporting as new guidelines are published from the NKF-ASN   Task force.    GFR calc non Af Amer  Date Value Ref Range Status  09/16/2020 86 >59 mL/min/1.73 Final   eGFR  Date Value Ref Range Status  07/05/2021 76 >59 mL/min/1.73 Final         Passed - Valid encounter within last 6 months    Recent Outpatient Visits          1 month ago Cough, unspecified type   [facility] Birdie Sons, MD   6 months ago Type 2 diabetes mellitus without complication, without long-term current use of insulin [facility])   [facility] Jerrol Banana., MD   11 months ago [place] Rosanna Randy, Retia Passe., MD   1 year ago Type 2 diabetes mellitus without complication, without long-term current use of insulin [facility])   [facility] Jerrol Banana., MD   2 years  ago Cough   [facility] Jerrol Banana., MD      Future Appointments            In 2 weeks Rumball, Jake Church, DO [facility], [place]

## 2022-01-11 ENCOUNTER — Other Ambulatory Visit: Payer: Self-pay | Admitting: Family Medicine

## 2022-01-11 DIAGNOSIS — E7849 Other hyperlipidemia: Secondary | ICD-10-CM

## 2022-01-11 NOTE — Telephone Encounter (Signed)
Requested Prescriptions  Pending Prescriptions Disp Refills   rosuvastatin (CRESTOR) 40 MG tablet [Pharmacy Med Name: ROSUVASTATIN CALCIUM 40 MG TAB] 90 tablet 1    Sig: TAKE 1 TABLET BY MOUTH EVERY DAY     Cardiovascular:  Antilipid - Statins Failed - 01/11/2022  1:21 AM      Failed - Total Cholesterol in normal range and within 360 days    Cholesterol, Total  Date Value Ref Range Status  07/05/2021 205 (H) 100 - 199 mg/dL Final         Failed - LDL in normal range and within 360 days    LDL Chol Calc (NIH)  Date Value Ref Range Status  07/05/2021 119 (H) 0 - 99 mg/dL Final         Failed - Triglycerides in normal range and within 360 days    Triglycerides  Date Value Ref Range Status  07/05/2021 202 (H) 0 - 149 mg/dL Final         Passed - HDL in normal range and within 360 days    HDL  Date Value Ref Range Status  07/05/2021 51 >39 mg/dL Final         Passed - Patient is not pregnant      Passed - Valid encounter within last 12 months    Recent Outpatient Visits          1 month ago Cough, unspecified type   [facility] Birdie Sons, MD   6 months ago Type 2 diabetes mellitus without complication, without long-term current use of insulin [facility])   [facility] Jerrol Banana., MD   11 months ago [place] Rosanna Randy, Retia Passe., MD   1 year ago Type 2 diabetes mellitus without complication, without long-term current use of insulin [facility])   [facility] Jerrol Banana., MD   2 years ago Cough   [facility] Jerrol Banana., MD      Future Appointments            In 2 weeks Rumball, Jake Church, DO [facility], [place]

## 2022-01-20 ENCOUNTER — Encounter: Payer: BC Managed Care – PPO | Admitting: Family Medicine

## 2022-01-25 ENCOUNTER — Encounter: Payer: BC Managed Care – PPO | Admitting: Family Medicine

## 2022-01-25 NOTE — Progress Notes (Deleted)
LMP 10/25/2009 (Approximate) Comment: tubal ligation   Subjective:    Patient ID: Hailey Horton, female    DOB: [birth year], 61 y.o.   MRN: [id number]  HPI: Hailey Horton is a 61 y.o. female presenting on 01/25/2022 for comprehensive medical examination. Current medical complaints include:{Blank single:19197::"none","***"}  Hypertension: - Medications: lisinopril, quinapril*** - Compliance: *** - Checking BP at home: *** - Denies any SOB, CP, vision changes, LE edema, medication SEs, or symptoms of hypotension - Diet: *** - Exercise: ***  Diabetes, Type 2 - Last A1c *** - Medications: metformin - Compliance: *** - Checking BG at home: *** - Diet: *** - Exercise: *** - Eye exam: due - Foot exam: due - Microalbumin: *** - Statin: yes - PNA vaccine: *** - Denies symptoms of hypoglycemia, polyuria, polydipsia, numbness extremities, foot ulcers/trauma  GERD - Meds: nexium - Symptoms:  {gerd sx:13195}.  - Denies {gerd sx:13195} {gerd dysphagia:12484} {weight loss:12477} {gross bleeding:12475}.  - Previous treatment: {acid meds:12473}.  Anxiety/Depression - Medications: xanax, lexapro - Taking: *** - Counseling: *** - Previous hospitalizations: *** - FH of psych illness: *** - Symptoms: *** - Current stressors: *** - Coping Mechanisms: ***   She currently lives with: Menopausal Symptoms: {Blank single:19197::"yes","no"}  Depression Screen done today and results listed below:  Depression screen [facility] 2/9 07/05/2021 09/12/2017 05/10/2017  Decreased Interest 1 1 0  Down, Depressed, Hopeless _0 PHQ - 2 Score _1 Altered sleeping _2 Tired, decreased energy _3 Change in appetite 0 1 0  Feeling bad or failure about yourself  0 1 1  Trouble concentrating 0 0 0  Moving slowly or fidgety/restless 0 0 0  Suicidal thoughts 0 0 (No Data)  PHQ-9 Score _4 Difficult doing work/chores Somewhat difficult Somewhat difficult -    The patient {has/does not  KYHC:62376} a history of falls. I {did/did not:19850} complete a risk assessment for falls. A plan of care for falls {was/was not:19852} documented.   Past Medical History:  Past Medical History:  Diagnosis Date   Allergy    Anxiety    Bronchitis    Bulging lumbar disc    DDD (degenerative disc disease), cervical    Depression    Diabetes mellitus without complication (HCC)    GERD (gastroesophageal reflux disease)    Hepatitis A    History of anemia    teenager   History of colon polyps    History of Lyme disease    History of [place] spotted fever    Hyperlipidemia    Hypertension    IBS (irritable bowel syndrome)    Left leg numbness    NASH (nonalcoholic steatohepatitis)    Otitis media    Left   PONV (postoperative nausea and vomiting)    prolonged sedation, blurry vision and headache for 6 months after   Sleep apnea    mild, no machine prescribed at this time   Tick bite    history of   TMJ (dislocation of temporomandibular joint)    VIN II (vulvar intraepithelial neoplasia II)    Wears glasses     Surgical History:  Past Surgical History:  Procedure Laterality Date   COLONOSCOPY  2010   CRYOTHERAPY     Right Toe Surgery     Patient states between 2013 and 2014   TUBAL LIGATION     VULVECTOMY PARTIAL N/A 10/09/2018   Procedure: VULVECTOMY PARTIAL;  Surgeon:  Isabel Caprice, MD;  [facility];  Service: Gynecology;  Laterality: N/A;   WRIST SURGERY  1994/1979    Medications:  Current Outpatient Medications on File Prior to Visit  Medication Sig   albuterol (PROVENTIL HFA;VENTOLIN HFA) 108 (90 Base) MCG/ACT inhaler Inhale 2 puffs into the lungs every 6 (six) hours as needed for wheezing or shortness of breath. (Patient not taking: Reported on 07/05/2021)   ALPRAZolam (XANAX) 0.5 MG tablet TAKE 1/2 TO 1 TABLET TWICE A DAY AS NEEDED   calcium carbonate (TUMS - DOSED IN MG ELEMENTAL CALCIUM) 500 MG chewable tablet Chew 1 tablet  by mouth as needed for indigestion or heartburn.   diphenhydrAMINE (BENADRYL) 25 mg capsule Take 25 mg by mouth every 6 (six) hours as needed.   escitalopram (LEXAPRO) 20 MG tablet Take 1 tablet (20 mg total) by mouth daily.   esomeprazole (NEXIUM) 40 MG capsule TAKE 1 CAPSULE (40 MG TOTAL) BY MOUTH DAILY.   lisinopril (ZESTRIL) 20 MG tablet Take 1 tablet (20 mg total) by mouth daily. TAKE IN PLACE OF QUINIPRIL   metFORMIN (GLUCOPHAGE) 1000 MG tablet TAKE 1 TABLET (1,000 MG TOTAL) DAILY WITH BREAKFAST BY MOUTH.   montelukast (SINGULAIR) 10 MG tablet TAKE 1 TABLET BY MOUTH EVERYDAY AT BEDTIME   naproxen sodium (ALEVE) 220 MG tablet Take 220 mg by mouth daily as needed.   nystatin ointment (MYCOSTATIN) Apply 1 application topically 2 (two) times daily. To affected area   quinapril (ACCUPRIL) 20 MG tablet Take 1 tablet (20 mg total) by mouth daily.   rosuvastatin (CRESTOR) 40 MG tablet TAKE 1 TABLET BY MOUTH EVERY DAY   Vitamin D, Ergocalciferol, (DRISDOL) 1.25 MG (50000 UNIT) CAPS capsule Take 1 capsule (50,000 Units total) by mouth every 7 (seven) days.   No current facility-administered medications on file prior to visit.    Allergies:  Allergies  Allergen Reactions   Shellfish Allergy Anaphylaxis   Acetaminophen-Codeine    Clarithromycin     GI upset   Codeine Phosphate     REACTION: difficulty breathing; vomiting   Hydrochlorothiazide    Other     Blue cheese N/V, diarrhea   Peanuts  [Peanut Oil] Swelling   Penicillins     REACTION: difficulty breathing; rash; vomiting   Sulfacetamide Sodium     REACTION: difficulty breathing, rash, vomiting   Amoxicillin Rash   Sulfa Antibiotics Rash    Social History:  Social History   Socioeconomic History   Marital status: Married    Spouse name: Not on file   Number of children: Not on file   Years of education: Not on file   Highest education level: Not on file  Occupational History   Not on file  Tobacco Use   Smoking status:  Never   Smokeless tobacco: Never  Vaping Use   Vaping Use: Never used  Substance and Sexual Activity   Alcohol use: No   Drug use: No   Sexual activity: Not on file  Other Topics Concern   Not on file  Social History Narrative   Not on file   Social Determinants of Health   Financial Resource Strain: Not on file  Food Insecurity: Not on file  Transportation Needs: Not on file  Physical Activity: Not on file  Stress: Not on file  Social Connections: Not on file  Intimate Partner Violence: Not on file   Social History   Tobacco Use  Smoking Status Never  Smokeless Tobacco Never  Social History   Substance and Sexual Activity  Alcohol Use No    Family History:  Family History  Problem Relation Age of Onset   Cancer Mother    Heart disease Mother    Hypertension Mother    Anemia Mother    Leukemia Mother    CVA Mother    Epilepsy Father    Heart attack Father    Diabetes Sister    Heart disease Sister    Arthritis Sister    Cancer Maternal Aunt        breast   Breast cancer Maternal Aunt     Past medical history, surgical history, medications, allergies, family history and social history reviewed with patient today and changes made to appropriate areas of the chart.      Objective:    LMP 10/25/2009 (Approximate) Comment: tubal ligation  Wt Readings from Last 3 Encounters:  11/26/21 146 lb (66.2 kg)  07/05/21 147 lb (66.7 kg)  09/16/20 153 lb (69.4 kg)    Physical Exam  Results for orders placed or performed in visit on 07/05/21  Hemoglobin A1c  Result Value Ref Range   Hgb A1c MFr Bld 7.1 (H) 4.8 - 5.6 %   Est. average glucose Bld gHb Est-mCnc 157 mg/dL  Lipid panel  Result Value Ref Range   Cholesterol, Total 205 (H) 100 - 199 mg/dL   Triglycerides 202 (H) 0 - 149 mg/dL   HDL 51 >39 mg/dL   VLDL Cholesterol Cal 35 5 - 40 mg/dL   LDL Chol Calc (NIH) 119 (H) 0 - 99 mg/dL   Chol/HDL Ratio 4.0 0.0 - 4.4 ratio  TSH  Result Value Ref Range    TSH 2.300 0.450 - 4.500 uIU/mL  CBC w/Diff/Platelet  Result Value Ref Range   WBC 5.9 3.4 - 10.8 x10E3/uL   RBC 4.74 3.77 - 5.28 x10E6/uL   Hemoglobin 11.6 11.1 - 15.9 g/dL   Hematocrit 36.5 34.0 - 46.6 %   MCV 77 (L) 79 - 97 fL   MCH 24.5 (L) 26.6 - 33.0 pg   MCHC 31.8 31.5 - 35.7 g/dL   RDW 16.1 (H) 11.7 - 15.4 %   Platelets 350 150 - 450 x10E3/uL   Neutrophils 64 Not Estab. %   Lymphs 26 Not Estab. %   Monocytes 5 Not Estab. %   Eos 4 Not Estab. %   Basos 1 Not Estab. %   Neutrophils Absolute 3.8 1.4 - 7.0 x10E3/uL   Lymphocytes Absolute 1.6 0.7 - 3.1 x10E3/uL   Monocytes Absolute 0.3 0.1 - 0.9 x10E3/uL   EOS (ABSOLUTE) 0.2 0.0 - 0.4 x10E3/uL   Basophils Absolute 0.1 0.0 - 0.2 x10E3/uL   Immature Granulocytes 0 Not Estab. %   Immature Grans (Abs) 0.0 0.0 - 0.1 x10E3/uL  Comprehensive Metabolic Panel (CMET)  Result Value Ref Range   Glucose 98 65 - 99 mg/dL   BUN 16 8 - 27 mg/dL   Creatinine, Ser 0.87 0.57 - 1.00 mg/dL   eGFR 76 >59 mL/min/1.73   BUN/Creatinine Ratio 18 12 - 28   Sodium 141 134 - 144 mmol/L   Potassium 4.6 3.5 - 5.2 mmol/L   Chloride 101 96 - 106 mmol/L   CO2 21 20 - 29 mmol/L   Calcium 9.7 8.7 - 10.3 mg/dL   Total Protein 7.0 6.0 - 8.5 g/dL   Albumin 4.3 3.8 - 4.8 g/dL   Globulin, Total 2.7 1.5 - 4.5 g/dL   Albumin/Globulin Ratio 1.6 1.2 -  2.2   Bilirubin Total 0.2 0.0 - 1.2 mg/dL   Alkaline Phosphatase 116 44 - 121 IU/L   AST 30 0 - 40 IU/L   ALT 31 0 - 32 IU/L      Assessment & Plan:   Problem List Items Addressed This Visit   None    Follow up plan: No follow-ups on file.   LABORATORY TESTING:  - Pap smear: {Blank LKJZPH:15056::"PVX done","not applicable","up to date","done elsewhere"}  IMMUNIZATIONS:   - Tdap: Tetanus vaccination status reviewed: {tetanus status:315746}. - Influenza: {Blank single:19197::"Up to date","Administered today","Postponed to flu season","Refused","Given elsewhere"} - Pneumococcal: {Blank single:19197::"Up  to date","Administered today","Not applicable","Refused","Given elsewhere"} - HPV: Not applicable - Shingrix vaccine: {Blank single:19197::"Up to date","Administered today","Not applicable","Refused","Given elsewhere"} - COVID vaccine: ***  SCREENING: - Mammogram: {Blank single:19197::"Up to date","Ordered today","Not applicable","Refused","Done elsewhere"}  - Colonoscopy: {Blank single:19197::"Up to date","Ordered today","Not applicable","Refused","Done elsewhere"}  - Bone Density: {Blank single:19197::"Up to date","Ordered today","Not applicable","Refused","Done elsewhere"}  - Lung Cancer Screening: Not applicable   Hep C Screening: UTD STD testing and prevention (HIV/chl/gon/syphilis): due Sexual History : Menstrual History/LMP/Abnormal Bleeding:  Incontinence Symptoms:   Osteoporosis: Discussed high calcium and vitamin D supplementation, weight bearing exercises  Advanced Care Planning: A voluntary discussion about advance care planning including the explanation and discussion of advance directives.  Discussed health care proxy and Living will, and the patient was able to identify a health care proxy as ***.  Patient {DOES_DOES YIA:16553} have a living will at present time. If patient does have living will, I have requested they bring this to the clinic to be scanned in to their chart.  PATIENT COUNSELING:   Advised to take 1 mg of folate supplement per day if capable of pregnancy.   Sexuality: Discussed sexually transmitted diseases, partner selection, use of condoms, avoidance of unintended pregnancy  and contraceptive alternatives.   Advised to avoid cigarette smoking.  I discussed with the patient that most people either abstain from alcohol or drink within safe limits (<=14/week and <=4 drinks/occasion for males, <=7/weeks and <= 3 drinks/occasion for females) and that the risk for alcohol disorders and other health effects rises proportionally with the number of drinks per week  and how often a drinker exceeds daily limits.  Discussed cessation/primary prevention of drug use and availability of treatment for abuse.   Diet: Encouraged to adjust caloric intake to maintain  or achieve ideal body weight, to reduce intake of dietary saturated fat and total fat, to limit sodium intake by avoiding high sodium foods and not adding table salt, and to maintain adequate dietary potassium and calcium preferably from fresh fruits, vegetables, and low-fat dairy products.    stressed the importance of regular exercise  Injury prevention: Discussed safety belts, safety helmets, smoke detector, smoking near bedding or upholstery.   Dental health: Discussed importance of regular tooth brushing, flossing, and dental visits.    NEXT PREVENTATIVE PHYSICAL DUE IN 1 YEAR. No follow-ups on file.

## 2022-03-01 ENCOUNTER — Other Ambulatory Visit: Payer: Self-pay | Admitting: Family Medicine

## 2022-03-01 DIAGNOSIS — K219 Gastro-esophageal reflux disease without esophagitis: Secondary | ICD-10-CM

## 2022-03-02 ENCOUNTER — Ambulatory Visit: Payer: BC Managed Care – PPO | Admitting: Physician Assistant

## 2022-03-02 NOTE — Telephone Encounter (Signed)
Requested Prescriptions  ?Pending Prescriptions Disp Refills  ?? esomeprazole (NEXIUM) 40 MG capsule [Pharmacy Med Name: ESOMEPRAZOLE MAG DR 40 MG CAP] 90 capsule 1  ?  Sig: TAKE 1 CAPSULE (40 MG TOTAL) BY MOUTH DAILY.  ?  ? Gastroenterology: Proton Pump Inhibitors 2 Passed - 03/01/2022  5:27 PM  ?  ?  Passed - ALT in normal range and within 360 days  ?  ALT  ?Date Value Ref Range Status  ?07/05/2021 31 0 - 32 IU/L Final  ?   ?  ?  Passed - AST in normal range and within 360 days  ?  AST  ?Date Value Ref Range Status  ?07/05/2021 30 0 - 40 IU/L Final  ?   ?  ?  Passed - Valid encounter within last 12 months  ?  Recent Outpatient Visits   ?      ? 3 months ago Cough, unspecified type  ? [facility] Birdie Sons, MD  ? 8 months ago Type 2 diabetes mellitus without complication, without long-term current use of insulin ([place])  ? [facility] Jerrol Banana., MD  ? 1 year ago COVID-19  ? [facility] Jerrol Banana., MD  ? 1 year ago Type 2 diabetes mellitus without complication, without long-term current use of insulin ([place])  ? [facility] Jerrol Banana., MD  ? 2 years ago Cough  ? [facility] Jerrol Banana., MD  ?  ?  ?Future Appointments   ?        ? In 3 months Jerrol Banana., MD [facility]  ?  ? ?  ?  ?  ? ? ?

## 2022-03-07 NOTE — Progress Notes (Unsigned)
Established patient visit   Patient: Hailey Horton   DOB: 04-30-1961   61 y.o. Female  MRN: [id number] Visit Date: 03/08/2022  Today's healthcare provider: Dani Gobble Mecum, PA-C   No chief complaint on file.  Subjective    HPI  Diabetes Mellitus Type II, follow-up  Lab Results  Component Value Date   HGBA1C 7.1 (H) 07/05/2021   HGBA1C 7.2 (H) 09/16/2020   HGBA1C 7.2 (A) 09/18/2019   Last seen for diabetes 8 months ago.  Management since then includes continuing the same treatment. She reports {excellent/good/fair/poor:19665} compliance with treatment. She {is/is not:21021397} having side effects. {document side effects if present:1}  Home blood sugar records: {diabetes glucometry results:16657}  Episodes of hypoglycemia? {Yes/No:20286} {enter details if yes:1}   Current insulin regiment: {***Type 'None' if not taking insulin                                                otherwise enter complete                                                 details of insulin regiment:1} Most Recent Eye Exam: ***  --------------------------------------------------------------------------------------------------- Hypertension, follow-up  BP Readings from Last 3 Encounters:  11/26/21 (!) 151/97  07/05/21 119/76  09/16/20 111/75   Wt Readings from Last 3 Encounters:  11/26/21 146 lb (66.2 kg)  07/05/21 147 lb (66.7 kg)  09/16/20 153 lb (69.4 kg)     She was last seen for hypertension 8 months ago.  BP at that visit was 119/76. Management since that visit includes continue .quinapril 20.  She reports {excellent/good/fair/poor:19665} compliance with treatment. She {is/is not:9024} having side effects. {document side effects if present:1} She {is/is not:9024} exercising. She {is/is not:9024} adherent to low salt diet.   Outside blood pressures are {enter patient reported home BP, or 'not being checked':1}.  She {does/does not:200015} smoke.  Use of agents associated with  hypertension: {bp agents assoc with hypertension:511::"none"}.   --------------------------------------------------------------------------------------------------- Lipid/Cholesterol, follow-up  Last Lipid Panel: Lab Results  Component Value Date   CHOL 205 (H) 07/05/2021   LDLCALC 119 (H) 07/05/2021   HDL 51 07/05/2021   TRIG 202 (H) 07/05/2021    She was last seen for this 8 months ago.  Management since that visit includes  She reports {excellent/good/fair/poor:19665} compliance with treatment. She {is/is not:9024} having side effects. {document side effects if present:1}  Symptoms: {Yes/No:20286} appetite changes {Yes/No:20286} foot ulcerations  {Yes/No:20286} chest pain {Yes/No:20286} chest pressure/discomfort  {Yes/No:20286} dyspnea {Yes/No:20286} orthopnea  {Yes/No:20286} fatigue {Yes/No:20286} lower extremity edema  {Yes/No:20286} palpitations {Yes/No:20286} paroxysmal nocturnal dyspnea  {Yes/No:20286} nausea {Yes/No:20286} numbness or tingling of extremity  {Yes/No:20286} polydipsia {Yes/No:20286} polyuria  {Yes/No:20286} speech difficulty {Yes/No:20286} syncope   She is following a {diet:21022986} diet. Current exercise: {exercise HFSFS:23953}  Last metabolic panel Lab Results  Component Value Date   GLUCOSE 98 07/05/2021   NA 141 07/05/2021   K 4.6 07/05/2021   BUN 16 07/05/2021   CREATININE 0.87 07/05/2021   EGFR 76 07/05/2021   GFRNONAA 86 09/16/2020   CALCIUM 9.7 07/05/2021   AST 30 07/05/2021   ALT 31 07/05/2021   The 10-year ASCVD risk score (Arnett DK, et al.,  is: 14.9% ° °---------------------------------------------------------------------------------------------------  ° °Medications: °Outpatient Medications Prior to Visit  °Medication Sig  ° albuterol (PROVENTIL HFA;VENTOLIN HFA) 108 (90 Base) MCG/ACT inhaler Inhale 2 puffs into the lungs every 6 (six) hours as needed for wheezing or shortness of breath. (Patient not taking: Reported on  07/05/2021)  ° ALPRAZolam (XANAX) 0.5 MG tablet TAKE 1/2 TO 1 TABLET TWICE A DAY AS NEEDED  ° calcium carbonate (TUMS - DOSED IN MG ELEMENTAL CALCIUM) 500 MG chewable tablet Chew 1 tablet by mouth as needed for indigestion or heartburn.  ° diphenhydrAMINE (BENADRYL) 25 mg capsule Take 25 mg by mouth every 6 (six) hours as needed.  ° escitalopram (LEXAPRO) 20 MG tablet Take 1 tablet (20 mg total) by mouth daily.  ° esomeprazole (NEXIUM) 40 MG capsule TAKE 1 CAPSULE (40 MG TOTAL) BY MOUTH DAILY.  ° lisinopril (ZESTRIL) 20 MG tablet Take 1 tablet (20 mg total) by mouth daily. TAKE IN PLACE OF QUINIPRIL  ° metFORMIN (GLUCOPHAGE) 1000 MG tablet TAKE 1 TABLET (1,000 MG TOTAL) DAILY WITH BREAKFAST BY MOUTH.  ° montelukast (SINGULAIR) 10 MG tablet TAKE 1 TABLET BY MOUTH EVERYDAY AT BEDTIME  ° naproxen sodium (ALEVE) 220 MG tablet Take 220 mg by mouth daily as needed.  ° nystatin ointment (MYCOSTATIN) Apply 1 application topically 2 (two) times daily. To affected area  ° quinapril (ACCUPRIL) 20 MG tablet Take 1 tablet (20 mg total) by mouth daily.  ° rosuvastatin (CRESTOR) 40 MG tablet TAKE 1 TABLET BY MOUTH EVERY DAY  ° Vitamin D, Ergocalciferol, (DRISDOL) 1.25 MG (50000 UNIT) CAPS capsule Take 1 capsule (50,000 Units total) by mouth every 7 (seven) days.  ° °No facility-administered medications prior to visit.  ° ° °Review of Systems ° °{Labs   Heme   Chem   Endocrine   Serology   Results Review (optional):23779} °  Objective  °  °LMP 10/25/2009 (Approximate) Comment: tubal ligation °{Show previous vital signs (optional):23777} ° °Physical Exam  °*** ° °No results found for any visits on 03/08/22. ° Assessment & Plan  °  ° °*** ° °No follow-ups on file.  °   ° °{provider attestation***:1} ° ° °Erin E Mecum, PA-C  °[facility] °[phone] (phone) °[phone] (fax) ° °[facility]  °

## 2022-03-08 ENCOUNTER — Other Ambulatory Visit: Payer: Self-pay

## 2022-03-08 ENCOUNTER — Encounter: Payer: Self-pay | Admitting: Physician Assistant

## 2022-03-08 ENCOUNTER — Ambulatory Visit: Payer: BC Managed Care – PPO | Admitting: Physician Assistant

## 2022-03-08 VITALS — BP 128/81 | HR 73 | Temp 97.3°F | Resp 16 | Wt 143.7 lb

## 2022-03-08 DIAGNOSIS — I10 Essential (primary) hypertension: Secondary | ICD-10-CM

## 2022-03-08 DIAGNOSIS — E7849 Other hyperlipidemia: Secondary | ICD-10-CM | POA: Diagnosis not present

## 2022-03-08 DIAGNOSIS — E119 Type 2 diabetes mellitus without complications: Secondary | ICD-10-CM | POA: Diagnosis not present

## 2022-03-08 LAB — POCT GLYCOSYLATED HEMOGLOBIN (HGB A1C)
Est. average glucose Bld gHb Est-mCnc: 151
Hemoglobin A1C: 6.9 % — AB (ref 4.0–5.6)

## 2022-03-08 NOTE — Assessment & Plan Note (Signed)
Chronic, historic condition ?A1c 7.1% today ?Well managed with Metformin 1074m PO QD  ?Discussed importance of diet and exercise in managing DM  ?Recommend she continue current medications at this time.  ?Checked urine microalbumin as well as Lipid, CMP today ?Results to dictate further management for potential comorbidities  ?Follow up in 3 months ?

## 2022-03-08 NOTE — Patient Instructions (Addendum)
Sleep hygiene (Practices to help improve sleep) ? ?*Try to go to bed at the same time every night ?*Make your room as dark and quiet as possible for sleep (ambient noises or sleep machine is okay too) ?*Try to establish a bedtime routine before bed (shower, reading for a little bit, etc.) ?*No TVs or phone for at least 38mn to an hour before bed. (The blue light from these devices can affect your sleep-wake cycle and make it harder to go to sleep) ?*Avoid caffeine in the afternoon and especially before bedtime. ? ?I also recommend starting a Melatonin supplement ( usually begin at 2 mg per night and we can see if that helps first before increasing the dose) ? ?The goal is to establish a relaxing environment and routine that signals your body that it is time to go to sleep.  ? ?I have ordered lab work for you- please return to have it drawn when you have been fasting for 8 hours ( nothing to eat or drink except water and/or black coffee at least 8 hours prior to lab draw) ? ?We will update you on the results of your labs as they are available.  ? ? ?

## 2022-03-08 NOTE — Assessment & Plan Note (Signed)
Chronic, historic condition  ?Well managed with Lisinopril 20 mg  ?Recommend continuing current medications ?Discussed importance of exercise and diet with managing HTN and DM ?Encouraged 150 minutes of moderate intensity physical activity per week  ?Follow up in 6 months ? ?

## 2022-03-08 NOTE — Assessment & Plan Note (Signed)
Chronic, historic condition ?Appears well managed on Rosuvastatin 61m PO QD ?Will recheck lipid panel for monitoring especially in context of concurrent DM ?Results to dictate further management  ?Follow up in 3-6 months ? ?

## 2022-03-09 LAB — MICROALBUMIN / CREATININE URINE RATIO
Creatinine, Urine: 82 mg/dL
Microalb/Creat Ratio: 215 mg/g creat — ABNORMAL HIGH (ref 0–29)
Microalbumin, Urine: 176.7 ug/mL

## 2022-03-09 LAB — SPECIMEN STATUS REPORT

## 2022-03-11 DIAGNOSIS — E119 Type 2 diabetes mellitus without complications: Secondary | ICD-10-CM | POA: Diagnosis not present

## 2022-03-11 DIAGNOSIS — I10 Essential (primary) hypertension: Secondary | ICD-10-CM | POA: Diagnosis not present

## 2022-03-11 DIAGNOSIS — E7849 Other hyperlipidemia: Secondary | ICD-10-CM | POA: Diagnosis not present

## 2022-03-12 LAB — COMPREHENSIVE METABOLIC PANEL
ALT: 17 IU/L (ref 0–32)
AST: 13 IU/L (ref 0–40)
Albumin/Globulin Ratio: 1.7 (ref 1.2–2.2)
Albumin: 4.1 g/dL (ref 3.8–4.8)
Alkaline Phosphatase: 89 IU/L (ref 44–121)
BUN/Creatinine Ratio: 18 (ref 12–28)
BUN: 22 mg/dL (ref 8–27)
Bilirubin Total: <0.2 mg/dL (ref 0.0–1.2)
CO2: 24 mmol/L (ref 20–29)
Calcium: 9.6 mg/dL (ref 8.7–10.3)
Chloride: 106 mmol/L (ref 96–106)
Creatinine, Ser: 1.21 mg/dL — ABNORMAL HIGH (ref 0.57–1.00)
Globulin, Total: 2.4 g/dL (ref 1.5–4.5)
Glucose: 129 mg/dL — ABNORMAL HIGH (ref 70–99)
Potassium: 4.4 mmol/L (ref 3.5–5.2)
Sodium: 144 mmol/L (ref 134–144)
Total Protein: 6.5 g/dL (ref 6.0–8.5)
eGFR: 51 mL/min/{1.73_m2} — ABNORMAL LOW (ref >59)

## 2022-03-12 LAB — LIPID PANEL
Chol/HDL Ratio: 2.4 ratio (ref 0.0–4.4)
Cholesterol, Total: 139 mg/dL (ref 100–199)
HDL: 58 mg/dL (ref >39)
LDL Chol Calc (NIH): 57 mg/dL (ref 0–99)
Triglycerides: 137 mg/dL (ref 0–149)
VLDL Cholesterol Cal: 24 mg/dL (ref 5–40)

## 2022-03-12 LAB — CBC WITH DIFFERENTIAL/PLATELET
Basophils Absolute: 0.1 10*3/uL (ref 0.0–0.2)
Basos: 1 %
EOS (ABSOLUTE): 0.3 10*3/uL (ref 0.0–0.4)
Eos: 4 %
Hematocrit: 35.4 % (ref 34.0–46.6)
Hemoglobin: 11.3 g/dL (ref 11.1–15.9)
Immature Grans (Abs): 0 10*3/uL (ref 0.0–0.1)
Immature Granulocytes: 0 %
Lymphocytes Absolute: 1.8 10*3/uL (ref 0.7–3.1)
Lymphs: 24 %
MCH: 25.9 pg — ABNORMAL LOW (ref 26.6–33.0)
MCHC: 31.9 g/dL (ref 31.5–35.7)
MCV: 81 fL (ref 79–97)
Monocytes Absolute: 0.5 10*3/uL (ref 0.1–0.9)
Monocytes: 6 %
Neutrophils Absolute: 4.9 10*3/uL (ref 1.4–7.0)
Neutrophils: 65 %
Platelets: 285 10*3/uL (ref 150–450)
RBC: 4.37 x10E6/uL (ref 3.77–5.28)
RDW: 16.8 % — ABNORMAL HIGH (ref 11.7–15.4)
WBC: 7.5 10*3/uL (ref 3.4–10.8)

## 2022-04-03 ENCOUNTER — Other Ambulatory Visit: Payer: Self-pay | Admitting: Family Medicine

## 2022-04-03 DIAGNOSIS — E119 Type 2 diabetes mellitus without complications: Secondary | ICD-10-CM

## 2022-05-20 ENCOUNTER — Ambulatory Visit: Payer: Self-pay | Admitting: *Deleted

## 2022-05-20 NOTE — Telephone Encounter (Addendum)
  Chief Complaint: Feels like passing out.   Dizzy.   She is out driving so I instructed her to stop driving and call [phone] or get someone to come get her but not to drive.   She was agreeable to calling someone to come get her. Symptoms: Dizzy for last 2 days but feels like she is going to pass out. Frequency: Last 2 days Pertinent Negatives: Patient denies chest pain or shortness of breath or sweating at this time. Disposition: [x] ED /[] Urgent Care (no appt availability in office) / [] Appointment(In office/virtual)/ []  [facility]/ [] Home Care/ [] Refused Recommended Disposition /[]  Mobile Bus/ []  Follow-up with PCP Additional Notes: Pt agreeable to calling someone to come get her since she is driving when she called in.   I called into [facility] and made them aware of referral to ED.   Dr. Rosanna Randy not in today but she is going to give this information to the clinical team.

## 2022-05-20 NOTE — Telephone Encounter (Signed)
Reason for Disposition  Sounds like a life-threatening emergency to the triager    Feels like passing out.   She is driving.   Instructed not to drive.  Answer Assessment - Initial Assessment Questions 1. DESCRIPTION: "Describe your dizziness."     I'm dizzy and feel like I could pass out.   I'm freezing too.   It started 2 days ago.    2. LIGHTHEADED: "Do you feel lightheaded?" (e.g., somewhat faint, woozy, weak upon standing)     Yes   Feel like I could pass out. I have diabetes.   My machine messed up so I can't check my blood sugar.   She is driving.   I instructed her to not drive.   To get someone to come get her and take her to the ED or call 911 to come to her location.   She has someone that can come get her. 3. VERTIGO: "Do you feel like either you or the room is spinning or tilting?" (i.e. vertigo)     No 4. SEVERITY: "How bad is it?"  "Do you feel like you are going to faint?" "Can you stand and walk?"   - MILD: Feels slightly dizzy, but walking normally.   - MODERATE: Feels unsteady when walking, but not falling; interferes with normal activities (e.g., school, work).   - SEVERE: Unable to walk without falling, or requires assistance to walk without falling; feels like passing out now.      Severe  Feels like passing out 5. ONSET:  "When did the dizziness begin?"     2 days ago 6. AGGRAVATING FACTORS: "Does anything make it worse?" (e.g., standing, change in head position)      7. HEART RATE: "Can you tell me your heart rate?" "How many beats in 15 seconds?"  (Note: not all patients can do this)        8. CAUSE: "What do you think is causing the dizziness?"     I don't know.   I don't have a way to check my sugar.  Denies sweating at this time but was sweating last night 9. RECURRENT SYMPTOM: "Have you had dizziness before?" If Yes, ask: "When was the last time?" "What happened that time?"     Not asked 10. OTHER SYMPTOMS: "Do you have any other symptoms?" (e.g., fever, chest  pain, vomiting, diarrhea, bleeding)       Denies chest pain or shortness of breath 11. PREGNANCY: "Is there any chance you are pregnant?" "When was your last menstrual period?"       N/A due to age  Protocols used: Dizziness - Lightheadedness-A-AH

## 2022-06-20 NOTE — Progress Notes (Signed)
I,Hailey Horton,acting as a scribe for Hailey Durie, MD.,have documented all relevant documentation on the behalf of Hailey Durie, MD,as directed by  Hailey Durie, MD while in the presence of Hailey Durie, MD.   Complete physical exam   Patient: Hailey Horton   DOB: 15-Nov-1961   61 y.o. Female  MRN: [id number] Visit Date: 06/21/2022  Today's healthcare provider: Wilhemena Durie, MD   No chief complaint on file.  Subjective    Hailey Horton is a 61 y.o. female who presents today for a complete physical exam.  She reports consuming a general diet. The patient does not participate in regular exercise at present. She generally feels fairly well. She reports sleeping fairly well. She does have additional problems to discuss today.  HPI  -Patient reports she feels as if she is going to pass out and gets dizzy when bending over daily for the last 4 months. Also, Saturday and Sunday and she had a nerve jumping in her right leg out of no where while sitting for at least a minute throughout the day and some on Monday -Colonoscopy 08/13/2009 every 10 yrs; Cologuard completed 12/03/2020 (negative) -Last eye exam 02/26/2018; has an appointment next month [facility] [place] -Last tetanus (tdap) 12/08/10; would like to receive today -Last shingrix 03/30/2020; would like to discuss with Dr.Linda Biehn -Would like pap smear done today as well as updated bone density Past Medical History:  Diagnosis Date   Allergy    Anxiety    Bronchitis    Bulging lumbar disc    DDD (degenerative disc disease), cervical    Depression    Diabetes mellitus without complication ([place])    GERD (gastroesophageal reflux disease)    Hepatitis A    History of anemia    teenager   History of colon polyps    History of Lyme disease    History of Rocky [place] spotted fever    Hyperlipidemia    Hypertension    IBS (irritable bowel syndrome)    Left leg numbness     NASH (nonalcoholic steatohepatitis)    Otitis media    Left   PONV (postoperative nausea and vomiting)    prolonged sedation, blurry vision and headache for 6 months after   Sleep apnea    mild, no machine prescribed at this time   Tick bite    history of   TMJ (dislocation of temporomandibular joint)    VIN II (vulvar intraepithelial neoplasia II)    Wears glasses    Past Surgical History:  Procedure Laterality Date   COLONOSCOPY  2010   CRYOTHERAPY     Right Toe Surgery     Patient states between 2013 and 2014   TUBAL LIGATION     VULVECTOMY PARTIAL N/A 10/09/2018   Procedure: VULVECTOMY PARTIAL;  Surgeon: Isabel Caprice, MD;  Location: [facility];  Service: Gynecology;  Laterality: N/A;   WRIST SURGERY  1994/1979   Social History   Socioeconomic History   Marital status: Married    Spouse name: Not on file   Number of children: Not on file   Years of education: Not on file   Highest education level: Not on file  Occupational History   Not on file  Tobacco Use   Smoking status: Never   Smokeless tobacco: Never  Vaping Use   Vaping Use: Never used  Substance and Sexual Activity   Alcohol use: No  Drug use: No   Sexual activity: Not on file  Other Topics Concern   Not on file  Social History Narrative   Not on file   Social Determinants of Health   Financial Resource Strain: Not on file  Food Insecurity: Not on file  Transportation Needs: Not on file  Physical Activity: Not on file  Stress: Not on file  Social Connections: Not on file  Intimate Partner Violence: Not on file   Family Status  Relation Name Status   Mother  Deceased at age 1   Father  Deceased at age 72       MI   Sister  Deceased at age 61       MI   Brother  Deceased at age 13       liver failure   Mat Aunt  (Not Specified)   Family History  Problem Relation Age of Onset   Cancer Mother    Heart disease Mother    Hypertension Mother    Anemia Mother     Leukemia Mother    CVA Mother    Epilepsy Father    Heart attack Father    Diabetes Sister    Heart disease Sister    Arthritis Sister    Cancer Maternal Aunt        breast   Breast cancer Maternal Aunt    Allergies  Allergen Reactions   Shellfish Allergy Anaphylaxis   Acetaminophen-Codeine    Clarithromycin     GI upset   Codeine Phosphate     REACTION: difficulty breathing; vomiting   Hydrochlorothiazide    Other     Blue cheese N/V, diarrhea   Peanuts  [Peanut Oil] Swelling   Penicillins     REACTION: difficulty breathing; rash; vomiting   Sulfacetamide Sodium     REACTION: difficulty breathing, rash, vomiting   Amoxicillin Rash   Sulfa Antibiotics Rash    Patient Care Team: Jerrol Banana., MD as PCP - General (Family Medicine)   Medications: Outpatient Medications Prior to Visit  Medication Sig   calcium carbonate (TUMS - DOSED IN MG ELEMENTAL CALCIUM) 500 MG chewable tablet Chew 1 tablet by mouth as needed for indigestion or heartburn.   escitalopram (LEXAPRO) 20 MG tablet Take 1 tablet (20 mg total) by mouth daily.   esomeprazole (NEXIUM) 40 MG capsule TAKE 1 CAPSULE (40 MG TOTAL) BY MOUTH DAILY.   lisinopril (ZESTRIL) 20 MG tablet Take 1 tablet (20 mg total) by mouth daily. TAKE IN PLACE OF QUINIPRIL   metFORMIN (GLUCOPHAGE) 1000 MG tablet TAKE 1 TABLET (1,000 MG TOTAL) DAILY WITH BREAKFAST BY MOUTH. STRENGTH: 1,000 MG   montelukast (SINGULAIR) 10 MG tablet TAKE 1 TABLET BY MOUTH EVERYDAY AT BEDTIME   naproxen sodium (ALEVE) 220 MG tablet Take 220 mg by mouth daily as needed.   nystatin ointment (MYCOSTATIN) Apply 1 application topically 2 (two) times daily. To affected area   rosuvastatin (CRESTOR) 40 MG tablet TAKE 1 TABLET BY MOUTH EVERY DAY   Vitamin D, Ergocalciferol, (DRISDOL) 1.25 MG (50000 UNIT) CAPS capsule Take 1 capsule (50,000 Units total) by mouth every 7 (seven) days.   No facility-administered medications prior to visit.    Review of  Systems  Constitutional:  Positive for fatigue.  Eyes:  Positive for photophobia.  Gastrointestinal:  Positive for abdominal distention, constipation and diarrhea.  Musculoskeletal:  Positive for arthralgias, back pain and neck pain.  Allergic/Immunologic: Positive for food allergies.  Neurological:  Positive for  dizziness, light-headedness and headaches.  Psychiatric/Behavioral:  Positive for sleep disturbance.   All other systems reviewed and are negative.   Last hemoglobin A1c Lab Results  Component Value Date   HGBA1C 7.0 (H) 06/21/2022      Objective    LMP 10/25/2009 (Approximate) Comment: tubal ligation BP Readings from Last 3 Encounters:  06/21/22 117/79  03/08/22 128/81  11/26/21 (!) 151/97   Wt Readings from Last 3 Encounters:  06/21/22 148 lb (67.1 kg)  03/08/22 143 lb 11.2 oz (65.2 kg)  11/26/21 146 lb (66.2 kg)       Physical Exam Vitals reviewed.  Constitutional:      General: She is not in acute distress.    Appearance: Normal appearance. She is well-developed.  HENT:     Head: Normocephalic and atraumatic.     Right Ear: Hearing normal.     Left Ear: Hearing normal.     Nose: Nose normal.  Eyes:     General: Lids are normal. No scleral icterus.       Right eye: No discharge.        Left eye: No discharge.     Conjunctiva/sclera: Conjunctivae normal.  Cardiovascular:     Rate and Rhythm: Normal rate and regular rhythm.     Heart sounds: Normal heart sounds.  Pulmonary:     Effort: Pulmonary effort is normal. No respiratory distress.     Breath sounds: Normal breath sounds.  Abdominal:     Palpations: Abdomen is soft.  Genitourinary:    General: Normal vulva.     Rectum: Normal.     Comments: Mildly atrophic Musculoskeletal:     Cervical back: Neck supple.  Skin:    Findings: No lesion or rash.  Neurological:     General: No focal deficit present.     Mental Status: She is alert and oriented to person, place, and time.  Psychiatric:         Mood and Affect: Mood normal.        Speech: Speech normal.        Behavior: Behavior normal.        Thought Content: Thought content normal.        Judgment: Judgment normal.       Last depression screening scores    03/08/2022    9:37 AM 07/05/2021    1:13 PM 09/12/2017    9:13 AM  PHQ 2/9 Scores  PHQ - 2 Score 1 2 2   PHQ- 9 Score 4 6 7    Last fall risk screening    03/08/2022    9:37 AM  [place] in the past year? 0  Number falls in past yr: 0  Injury with Fall? 0   Last Audit-C alcohol use screening    03/08/2022    9:39 AM  Alcohol Use Disorder Test (AUDIT)  1. How often do you have a drink containing alcohol? 0  2. How many drinks containing alcohol do you have on a typical day when you are drinking? 0  3. How often do you have six or more drinks on one occasion? 0  AUDIT-C Score 0   A score of 3 or more in women, and 4 or more in men indicates increased risk for alcohol abuse, EXCEPT if all of the points are from question 1   No results found for any visits on 06/21/22.  Assessment & Plan    Routine Health Maintenance and Physical Exam  Exercise  Activities and Dietary recommendations  Goals   None     Immunization History  Administered Date(s) Administered   Hepatitis A 01/29/2010, 08/02/2010   Hepatitis B 01/29/2010, 02/26/2010, 08/02/2010   Influenza,inj,Quad PF,6+ Mos 09/18/2015, 09/22/2016, 09/12/2017, 09/13/2018, 09/18/2019, 09/16/2020   Pneumococcal Polysaccharide-23 12/09/2011, 10/31/2017   Tdap 12/08/2010   Zoster Recombinat (Shingrix) 03/30/2020    Health Maintenance  Topic Date Due   COVID-19 Vaccine (1) Never done   HIV Screening  Never done   FOOT EXAM  10/31/2018   OPHTHALMOLOGY EXAM  02/27/2019   COLONOSCOPY (Pts 45-20yr Insurance coverage will need to be confirmed)  08/14/2019   MAMMOGRAM  10/18/2019   Zoster Vaccines- Shingrix (2 of 2) 05/25/2020   PAP SMEAR-Modifier  09/12/2020   TETANUS/TDAP  12/08/2020    INFLUENZA VACCINE  07/26/2022   HEMOGLOBIN A1C  09/08/2022   Hepatitis C Screening  Completed   HPV VACCINES  Aged Out    Discussed health benefits of physical activity, and encouraged her to engage in regular exercise appropriate for her age and condition.  1. Annual physical exam Probably last Pap smear at age 61- MM 3D SCREEN BREAST BILATERAL; Future - Cytology - PAP - POCT Urinalysis Dipstick  2. Type 2 diabetes mellitus without complication, without long-term current use of insulin (HCC) Goal A1c less than 7 - Hemoglobin A1c - Comprehensive Metabolic Panel (CMET) - CBC w/Diff/Platelet - TSH  3. Essential hypertension Watch home blood pressures.  Patient thinks she is getting hypotensive. Push hydration and check home blood pressures and bring those in in a couple of months. There is no evidence that her symptoms are syncopal or arrhythmias - Hemoglobin A1c - Comprehensive Metabolic Panel (CMET) - CBC w/Diff/Platelet - TSH - lisinopril (ZESTRIL) 5 MG tablet; Take 1 tablet (5 mg total) by mouth daily.  Dispense: 90 tablet; Refill: 1  4. Other hyperlipidemia On rosuvastatin 40 - Hemoglobin A1c - Comprehensive Metabolic Panel (CMET) - CBC w/Diff/Platelet - TSH  5. Major depressive disorder, remission status unspecified, unspecified whether recurrent On Lexapro - Hemoglobin A1c - Comprehensive Metabolic Panel (CMET) - CBC w/Diff/Platelet - TSH  6. Anxiety On Lexapro. - Hemoglobin A1c - Comprehensive Metabolic Panel (CMET) - CBC w/Diff/Platelet - TSH  7. Avitaminosis D  - Vitamin D, Ergocalciferol, (DRISDOL) 1.25 MG (50000 UNIT) CAPS capsule; Take 1 capsule (50,000 Units total) by mouth every 7 (seven) days.  Dispense: 12 capsule; Refill: 3   No follow-ups on file.     I, RWilhemena Durie MD, have reviewed all documentation for this visit. The documentation on 06/23/22 for the exam, diagnosis, procedures, and orders are all accurate and  complete.    Evee Liska GCranford Mon MD  [phone](phone) [phone](fax)  [place]

## 2022-06-21 ENCOUNTER — Ambulatory Visit (INDEPENDENT_AMBULATORY_CARE_PROVIDER_SITE_OTHER): Payer: BC Managed Care – PPO | Admitting: Family Medicine

## 2022-06-21 ENCOUNTER — Other Ambulatory Visit (HOSPITAL_COMMUNITY)
Admission: RE | Admit: 2022-06-21 | Discharge: 2022-06-21 | Disposition: A | Discharge status: Home / Self Care | Payer: BC Managed Care – PPO | Source: Ambulatory Visit | Attending: Family Medicine | Admitting: Family Medicine

## 2022-06-21 ENCOUNTER — Encounter: Payer: Self-pay | Admitting: Family Medicine

## 2022-06-21 VITALS — BP 117/79 | HR 76 | Ht 63.0 in | Wt 148.0 lb

## 2022-06-21 DIAGNOSIS — E7849 Other hyperlipidemia: Secondary | ICD-10-CM | POA: Diagnosis not present

## 2022-06-21 DIAGNOSIS — K7581 Nonalcoholic steatohepatitis (NASH): Secondary | ICD-10-CM

## 2022-06-21 DIAGNOSIS — Z23 Encounter for immunization: Secondary | ICD-10-CM | POA: Diagnosis not present

## 2022-06-21 DIAGNOSIS — F329 Major depressive disorder, single episode, unspecified: Secondary | ICD-10-CM

## 2022-06-21 DIAGNOSIS — E559 Vitamin D deficiency, unspecified: Secondary | ICD-10-CM

## 2022-06-21 DIAGNOSIS — I10 Essential (primary) hypertension: Secondary | ICD-10-CM | POA: Diagnosis not present

## 2022-06-21 DIAGNOSIS — Z0000 Encounter for general adult medical examination without abnormal findings: Secondary | ICD-10-CM | POA: Diagnosis not present

## 2022-06-21 DIAGNOSIS — E119 Type 2 diabetes mellitus without complications: Secondary | ICD-10-CM | POA: Diagnosis not present

## 2022-06-21 DIAGNOSIS — F419 Anxiety disorder, unspecified: Secondary | ICD-10-CM

## 2022-06-21 LAB — POCT URINALYSIS DIPSTICK
Bilirubin, UA: NEGATIVE
Glucose, UA: NEGATIVE
Ketones, UA: NEGATIVE
Leukocytes, UA: NEGATIVE
Nitrite, UA: NEGATIVE
Protein, UA: POSITIVE — AB
Spec Grav, UA: >=1.03 — AB (ref 1.010–1.025)
Urobilinogen, UA: 0.2 E.U./dL
pH, UA: 6 (ref 5.0–8.0)

## 2022-06-21 MED ORDER — LISINOPRIL 5 MG PO TABS: 5.0000 mg | ORAL_TABLET | Freq: Every day | ORAL | 1 refills | Status: DC

## 2022-06-21 MED ORDER — LISINOPRIL 20 MG PO TABS: 20.0000 mg | ORAL_TABLET | Freq: Every day | ORAL | 1 refills | Status: DC

## 2022-06-21 MED ORDER — VITAMIN D (ERGOCALCIFEROL) 1.25 MG (50000 UNIT) PO CAPS: 50000.0000 [IU] | ORAL_CAPSULE | ORAL | 3 refills | Status: DC

## 2022-06-22 LAB — HEMOGLOBIN A1C
Est. average glucose Bld gHb Est-mCnc: 154 mg/dL
Hgb A1c MFr Bld: 7 % — ABNORMAL HIGH (ref 4.8–5.6)

## 2022-06-22 LAB — TSH: TSH: 2.76 u[IU]/mL (ref 0.450–4.500)

## 2022-06-22 LAB — COMPREHENSIVE METABOLIC PANEL
ALT: 20 IU/L (ref 0–32)
AST: 20 IU/L (ref 0–40)
Albumin/Globulin Ratio: 1.8 (ref 1.2–2.2)
Albumin: 4.4 g/dL (ref 3.8–4.8)
Alkaline Phosphatase: 101 IU/L (ref 44–121)
BUN/Creatinine Ratio: 21 (ref 12–28)
BUN: 29 mg/dL — ABNORMAL HIGH (ref 8–27)
Bilirubin Total: 0.2 mg/dL (ref 0.0–1.2)
CO2: 23 mmol/L (ref 20–29)
Calcium: 10.3 mg/dL (ref 8.7–10.3)
Chloride: 101 mmol/L (ref 96–106)
Creatinine, Ser: 1.35 mg/dL — ABNORMAL HIGH (ref 0.57–1.00)
Globulin, Total: 2.5 g/dL (ref 1.5–4.5)
Glucose: 113 mg/dL — ABNORMAL HIGH (ref 70–99)
Potassium: 4.5 mmol/L (ref 3.5–5.2)
Sodium: 141 mmol/L (ref 134–144)
Total Protein: 6.9 g/dL (ref 6.0–8.5)
eGFR: 44 mL/min/{1.73_m2} — ABNORMAL LOW (ref >59)

## 2022-06-22 LAB — CBC WITH DIFFERENTIAL/PLATELET
Basophils Absolute: 0.1 10*3/uL (ref 0.0–0.2)
Basos: 1 %
EOS (ABSOLUTE): 0.2 10*3/uL (ref 0.0–0.4)
Eos: 3 %
Hematocrit: 36.7 % (ref 34.0–46.6)
Hemoglobin: 11.8 g/dL (ref 11.1–15.9)
Immature Grans (Abs): 0 10*3/uL (ref 0.0–0.1)
Immature Granulocytes: 0 %
Lymphocytes Absolute: 1.9 10*3/uL (ref 0.7–3.1)
Lymphs: 26 %
MCH: 26.8 pg (ref 26.6–33.0)
MCHC: 32.2 g/dL (ref 31.5–35.7)
MCV: 83 fL (ref 79–97)
Monocytes Absolute: 0.5 10*3/uL (ref 0.1–0.9)
Monocytes: 6 %
Neutrophils Absolute: 4.8 10*3/uL (ref 1.4–7.0)
Neutrophils: 64 %
Platelets: 305 10*3/uL (ref 150–450)
RBC: 4.4 x10E6/uL (ref 3.77–5.28)
RDW: 14.9 % (ref 11.7–15.4)
WBC: 7.5 10*3/uL (ref 3.4–10.8)

## 2022-06-23 ENCOUNTER — Other Ambulatory Visit: Payer: Self-pay | Admitting: Family Medicine

## 2022-06-23 DIAGNOSIS — E119 Type 2 diabetes mellitus without complications: Secondary | ICD-10-CM

## 2022-06-24 LAB — CYTOLOGY - PAP: Diagnosis: NEGATIVE

## 2022-07-12 ENCOUNTER — Other Ambulatory Visit: Payer: Self-pay | Admitting: Family Medicine

## 2022-07-12 DIAGNOSIS — E7849 Other hyperlipidemia: Secondary | ICD-10-CM

## 2022-07-12 DIAGNOSIS — F32A Depression, unspecified: Secondary | ICD-10-CM

## 2022-07-12 DIAGNOSIS — K219 Gastro-esophageal reflux disease without esophagitis: Secondary | ICD-10-CM

## 2022-07-15 ENCOUNTER — Ambulatory Visit
Admission: RE | Admit: 2022-07-15 | Discharge: 2022-07-15 | Disposition: A | Discharge status: Home / Self Care | Payer: BC Managed Care – PPO | Source: Ambulatory Visit | Attending: Family Medicine | Admitting: Family Medicine

## 2022-07-15 DIAGNOSIS — H25813 Combined forms of age-related cataract, bilateral: Secondary | ICD-10-CM | POA: Diagnosis not present

## 2022-07-15 DIAGNOSIS — E119 Type 2 diabetes mellitus without complications: Secondary | ICD-10-CM | POA: Diagnosis not present

## 2022-07-15 DIAGNOSIS — H524 Presbyopia: Secondary | ICD-10-CM | POA: Diagnosis not present

## 2022-07-15 DIAGNOSIS — Z1231 Encounter for screening mammogram for malignant neoplasm of breast: Secondary | ICD-10-CM | POA: Insufficient documentation

## 2022-07-15 DIAGNOSIS — Z0000 Encounter for general adult medical examination without abnormal findings: Secondary | ICD-10-CM

## 2022-07-20 ENCOUNTER — Other Ambulatory Visit: Payer: Self-pay | Admitting: Family Medicine

## 2022-07-20 DIAGNOSIS — E119 Type 2 diabetes mellitus without complications: Secondary | ICD-10-CM

## 2022-07-21 MED ORDER — METFORMIN HCL 1000 MG PO TABS: ORAL_TABLET | ORAL | 0 refills | Status: DC

## 2022-09-19 ENCOUNTER — Other Ambulatory Visit: Payer: Self-pay | Admitting: Family Medicine

## 2022-09-19 DIAGNOSIS — K219 Gastro-esophageal reflux disease without esophagitis: Secondary | ICD-10-CM

## 2022-09-19 NOTE — Telephone Encounter (Signed)
Medication Refill - Medication: esomeprazole (NEXIUM) 40 MG capsule Pt stated she is completely out of medication.   Has the patient contacted their pharmacy? No. No, more refills.  (Agent: If no, request that the patient contact the pharmacy for the refill. If patient does not wish to contact the pharmacy document the reason why and proceed with request.)   Preferred Pharmacy (with phone number or street name):  [id number] [id number] Phone: [phone]: [phone] Hours: Not open 24 hours   Has the patient been seen for an appointment in the last year OR does the patient have an upcoming appointment? Yes.    Agent: Please be advised that RX refills may take up to 3 business days. We ask that you follow-up with your pharmacy.

## 2022-09-20 MED ORDER — ESOMEPRAZOLE MAGNESIUM 40 MG PO CPDR: 40.0000 mg | DELAYED_RELEASE_CAPSULE | Freq: Every day | ORAL | 0 refills | Status: DC

## 2022-09-20 NOTE — Telephone Encounter (Signed)
Pt requesting change of pharmacy  Requested Prescriptions  Pending Prescriptions Disp Refills  . esomeprazole (NEXIUM) 40 MG capsule 90 capsule 0    Sig: Take 1 capsule (40 mg total) by mouth daily.     Gastroenterology: Proton Pump Inhibitors 2 Passed - 09/19/2022 12:06 PM      Passed - ALT in normal range and within 360 days    ALT  Date Value Ref Range Status  06/21/2022 20 0 - 32 IU/L Final         Passed - AST in normal range and within 360 days    AST  Date Value Ref Range Status  06/21/2022 20 0 - 40 IU/L Final         Passed - Valid encounter within last 12 months    Recent Outpatient Visits          3 months ago Annual physical exam   [facility] Jerrol Banana., MD   6 months ago Type 2 diabetes mellitus without complication, without long-term current use of insulin ([place])   [employer], Erin E, PA-C   9 months ago Cough, unspecified type   [facility] Birdie Sons, MD   1 year ago Type 2 diabetes mellitus without complication, without long-term current use of insulin [facility])   [facility] Jerrol Banana., MD   1 year ago [place] Jerrol Banana., MD      Future Appointments            In 6 days Simmons-Robinson, Riki Sheer, MD [facility], [place]

## 2022-09-21 ENCOUNTER — Ambulatory Visit: Payer: BC Managed Care – PPO | Admitting: Family Medicine

## 2022-09-26 ENCOUNTER — Encounter: Payer: Self-pay | Admitting: Family Medicine

## 2022-09-26 ENCOUNTER — Ambulatory Visit: Payer: BC Managed Care – PPO | Admitting: Family Medicine

## 2022-09-26 VITALS — BP 108/71 | HR 71 | Temp 97.5°F | Resp 16 | Ht 63.0 in | Wt 141.0 lb

## 2022-09-26 DIAGNOSIS — E119 Type 2 diabetes mellitus without complications: Secondary | ICD-10-CM | POA: Diagnosis not present

## 2022-09-26 DIAGNOSIS — I10 Essential (primary) hypertension: Secondary | ICD-10-CM

## 2022-09-26 MED ORDER — LISINOPRIL 5 MG PO TABS: 2.5000 mg | ORAL_TABLET | Freq: Every day | ORAL | 0 refills | Status: DC

## 2022-09-26 MED ORDER — METFORMIN HCL ER 750 MG PO TB24: 750.0000 mg | ORAL_TABLET | Freq: Every day | ORAL | 0 refills | Status: DC

## 2022-09-26 NOTE — Assessment & Plan Note (Addendum)
Chronic, controlled last A1c less than 8, 7.0 in June 2023  She will trial 71m metformin due to gastrointestinal issues on 10051mdaily  Recommended increased water intake to prevent dehydration  She will follow up in 2 weeks and obtain A1cd at that time .

## 2022-09-26 NOTE — Patient Instructions (Addendum)
It was a pleasure to see you today!  Thank you for choosing [facility] for your primary care.   [facility] was seen for diabetes and high blood pressure.   Our plans for today were: For your blood pressure, please start taking 1/2 of the lisinopril tablet for 2.8m daily.  Please continue checking your blood pressure 1-2 hours after your medication.  I have sent a prescription for another form of metformin at 7535mdaily.  Please make sure to drink plenty of fluids.   To keep you healthy, please keep in mind the following health maintenance items that you are due for:   Eye Exam  Shingles vaccine  Flu vaccine     You should return to our clinic in 2 weeks for follow up for blood pressure and diabetes.   Best Wishes,   Dr. RoQuentin Cornwall

## 2022-09-26 NOTE — Progress Notes (Signed)
    Established patient visit  I,Joseline E Rosas,acting as a scribe for Makiera Simmons-Robinson, MD.,have documented all relevant documentation on the behalf of Makiera Simmons-Robinson, MD,as directed by  Makiera Simmons-Robinson, MD while in the presence of Makiera Simmons-Robinson, MD.   Patient: Hailey Horton   DOB: 06/26/1961   60 y.o. Female  MRN: [id number] Visit Date: 09/26/2022  Today's healthcare provider: Makiera Simmons-Robinson, MD   Chief Complaint  Patient presents with   Follow-up HTN   Subjective    HPI  Hypertension, follow-up  BP Readings from Last 3 Encounters:  09/26/22 108/71  06/21/22 117/79  03/08/22 128/81   Wt Readings from Last 3 Encounters:  09/26/22 141 lb (64 kg)  06/21/22 148 lb (67.1 kg)  03/08/22 143 lb 11.2 oz (65.2 kg)     She was last seen for hypertension 3 months ago.  BP at that visit was 117/79. Management since that visit includes :Push oral hydration and check home blood pressures and bring those in in a couple of months..  She reports excellent compliance with treatment. She is  having side effects.  She is following a Regular diet. She is not exercising. She does not smoke.  Outside blood pressures are 120/70 110's/70's Symptoms: some lightheadedness with standing  No chest pain No chest pressure  No palpitations No syncope  No dyspnea No orthopnea  No paroxysmal nocturnal dyspnea No lower extremity edema   Pertinent labs Lab Results  Component Value Date   CHOL 139 03/11/2022   HDL 58 03/11/2022   LDLCALC 57 03/11/2022   TRIG 137 03/11/2022   CHOLHDL 2.4 03/11/2022   Lab Results  Component Value Date   NA 141 06/21/2022   K 4.5 06/21/2022   CREATININE 1.35 (H) 06/21/2022   EGFR 44 (L) 06/21/2022   GLUCOSE 113 (H) 06/21/2022   TSH 2.760 06/21/2022     The 10-year ASCVD risk score (Arnett DK, et al., 2019) is: 5.6%   DM Diabetes Current Regimen: metformin 1000mg daily  CBGs: does not check   Last  A1c:  Lab Results  Component Value Date   HGBA1C 7.0 (H) 06/21/2022    Last Eye Exam: needs updated eye exam  Statin: on rosuvastatin 40mg  ACE/ARB: lisinopril 5   ---------------------------------------------------------------------------------------------------   Medications: Outpatient Medications Prior to Visit  Medication Sig   calcium carbonate (TUMS - DOSED IN MG ELEMENTAL CALCIUM) 500 MG chewable tablet Chew 1 tablet by mouth as needed for indigestion or heartburn.   escitalopram (LEXAPRO) 20 MG tablet TAKE 1 TABLET BY MOUTH EVERY DAY   esomeprazole (NEXIUM) 40 MG capsule Take 1 capsule (40 mg total) by mouth daily.   naproxen sodium (ALEVE) 220 MG tablet Take 220 mg by mouth daily as needed.   nystatin ointment (MYCOSTATIN) Apply 1 application topically 2 (two) times daily. To affected area   rosuvastatin (CRESTOR) 40 MG tablet TAKE 1 TABLET BY MOUTH EVERY DAY   Vitamin D, Ergocalciferol, (DRISDOL) 1.25 MG (50000 UNIT) CAPS capsule Take 1 capsule (50,000 Units total) by mouth every 7 (seven) days.   [DISCONTINUED] lisinopril (ZESTRIL) 5 MG tablet Take 1 tablet (5 mg total) by mouth daily.   [DISCONTINUED] metFORMIN (GLUCOPHAGE) 1000 MG tablet TAKE 1 TABLET (1,000 MG TOTAL) DAILY WITH BREAKFAST BY MOUTH.   [DISCONTINUED] montelukast (SINGULAIR) 10 MG tablet TAKE 1 TABLET BY MOUTH EVERYDAY AT BEDTIME (Patient not taking: Reported on 06/21/2022)   No facility-administered medications prior to visit.    Review of Systems       Objective    BP 108/71 (BP Location: Left Arm, Patient Position: Sitting, Cuff Size: Normal)   Pulse 71   Temp (!) 97.5 F (36.4 C) (Oral)   Resp 16   Ht 5' 3" (1.6 m)   Wt 141 lb (64 kg)   LMP 10/25/2009 (Approximate) Comment: tubal ligation  BMI 24.98 kg/m    Physical Exam Vitals reviewed.  Constitutional:      General: She is not in acute distress.    Appearance: Normal appearance. She is not ill-appearing, toxic-appearing or diaphoretic.   Eyes:     Conjunctiva/sclera: Conjunctivae normal.  Cardiovascular:     Rate and Rhythm: Normal rate and regular rhythm.     Pulses: Normal pulses.     Heart sounds: Normal heart sounds. No murmur heard.    No friction rub. No gallop.  Pulmonary:     Effort: Pulmonary effort is normal. No respiratory distress.     Breath sounds: Normal breath sounds. No stridor. No wheezing, rhonchi or rales.  Neurological:     Mental Status: She is alert and oriented to person, place, and time.       No results found for any visits on 09/26/22.  Assessment & Plan     Problem List Items Addressed This Visit       Cardiovascular and Mediastinum   Essential hypertension    Chronic, controlled  She will decrease 5mg lisinopril to 2.5mg and follow up in 2 weeks  Recommended continuing to check BP at home 1-2 hours after medication        Relevant Medications   lisinopril (ZESTRIL) 5 MG tablet     Endocrine   Controlled type 2 diabetes mellitus without complication, without long-term current use of insulin (HCC) - Primary    Chronic, controlled last A1c less than 8, 7.0 in June 2023  She will trial 750mg metformin due to gastrointestinal issues on 1000mg daily  Recommended increased water intake to prevent dehydration  She will follow up in 2 weeks and obtain A1cd at that time .       Relevant Medications   lisinopril (ZESTRIL) 5 MG tablet   metFORMIN (GLUCOPHAGE-XR) 750 MG 24 hr tablet     Return in about 2 weeks (around 10/10/2022) for HTN, med changes.     I, Makiera Simmons-Robinson, MD, have reviewed all documentation for this visit. The documentation on 09/26/22 for the exam, diagnosis, procedures, and orders are all accurate and complete.     Makiera Simmons-Robinson, MD  [facility] [phone] (phone) [phone] (fax)  [facility]  

## 2022-09-26 NOTE — Assessment & Plan Note (Signed)
Chronic, controlled  She will decrease 44m lisinopril to 2.5105mand follow up in 2 weeks  Recommended continuing to check BP at home 1-2 hours after medication

## 2022-10-06 ENCOUNTER — Ambulatory Visit
Admission: RE | Admit: 2022-10-06 | Discharge: 2022-10-06 | Disposition: A | Discharge status: Home / Self Care | Payer: BC Managed Care – PPO | Attending: Family Medicine | Admitting: Family Medicine

## 2022-10-06 ENCOUNTER — Ambulatory Visit
Admission: RE | Admit: 2022-10-06 | Discharge: 2022-10-06 | Disposition: A | Discharge status: Home / Self Care | Payer: BC Managed Care – PPO | Source: Ambulatory Visit | Attending: Family Medicine | Admitting: Family Medicine

## 2022-10-06 ENCOUNTER — Encounter: Payer: Self-pay | Admitting: Family Medicine

## 2022-10-06 ENCOUNTER — Ambulatory Visit (INDEPENDENT_AMBULATORY_CARE_PROVIDER_SITE_OTHER): Payer: BC Managed Care – PPO | Admitting: Family Medicine

## 2022-10-06 VITALS — BP 118/77 | HR 72 | Temp 97.7°F | Resp 16 | Wt 142.9 lb

## 2022-10-06 DIAGNOSIS — M25571 Pain in right ankle and joints of right foot: Secondary | ICD-10-CM

## 2022-10-06 DIAGNOSIS — M25572 Pain in left ankle and joints of left foot: Secondary | ICD-10-CM | POA: Diagnosis not present

## 2022-10-06 DIAGNOSIS — I10 Essential (primary) hypertension: Secondary | ICD-10-CM | POA: Diagnosis not present

## 2022-10-06 DIAGNOSIS — M79662 Pain in left lower leg: Secondary | ICD-10-CM | POA: Diagnosis not present

## 2022-10-06 DIAGNOSIS — M898X6 Other specified disorders of bone, lower leg: Secondary | ICD-10-CM

## 2022-10-06 DIAGNOSIS — M791 Myalgia, unspecified site: Secondary | ICD-10-CM | POA: Diagnosis not present

## 2022-10-06 DIAGNOSIS — M79661 Pain in right lower leg: Secondary | ICD-10-CM | POA: Diagnosis not present

## 2022-10-06 NOTE — Patient Instructions (Addendum)
It was a pleasure to see you today!  Thank you for choosing [facility] for your primary care.   [facility] was seen for leg discomfort.   Our plans for today were: I will send you to the outpatient imaging center for xrays of your lower legs.  We will check your electrolytes and for any signs of muscle break down with blood work.  Please cut your rosuvastatin in half for 35m daily and see if this helps with your symptoms   You should return to our clinic in 3 weeks for leg discomfort.   Best Wishes,   Dr. RQuentin Cornwall

## 2022-10-06 NOTE — Assessment & Plan Note (Signed)
New problem  High suspicion that this is related to statins however given the point tenderness along anterior legs and pain with ROM for bilateral ankles R>L, will send for xrays of ankles and tibias bilaterally  Recommended naproxen in the interim if xrays and blood work negative  Will obtain CK and CMP to assess for electrolyte derangements  No recent illness so suspicion for viral induced myalgia is low at this time  She will follow up in 3 weeks

## 2022-10-06 NOTE — Assessment & Plan Note (Signed)
BP is within goal range today  She will continue lisinopril 61m daily

## 2022-10-06 NOTE — Progress Notes (Signed)
I,Joseline E Rosas,acting as a scribe for [employer], MD.,have documented all relevant documentation on the behalf of Eulis Foster, MD,as directed by  Eulis Foster, MD while in the presence of Eulis Foster, MD.   Established patient visit   Patient: Hailey Horton   DOB: 26-Sep-1961   61 y.o. Female  MRN: [id number] Visit Date: 10/06/2022  Today's healthcare provider: Eulis Foster, MD   Chief Complaint  Patient presents with   Leg Pain   Subjective    Leg Pain  The incident occurred 3 to 5 days ago. There was no injury mechanism. The pain is present in the left knee, right ankle, right leg and left leg. The quality of the pain is described as aching. The pain is at a severity of 8/10. The pain is moderate. The pain has been Constant since onset. Associated symptoms include an inability to bear weight ("sometimes"). Pertinent negatives include no loss of motion, loss of sensation, muscle weakness, numbness or tingling. She reports no foreign bodies present. The symptoms are aggravated by weight bearing and movement.     HTN  Patient presents with other chief complaint however was seen recently with elevated BP  She is not having HA, chest pain or lightheadedness associated with lisinopril 65m daily   Medications: Outpatient Medications Prior to Visit  Medication Sig   calcium carbonate (TUMS - DOSED IN MG ELEMENTAL CALCIUM) 500 MG chewable tablet Chew 1 tablet by mouth as needed for indigestion or heartburn.   escitalopram (LEXAPRO) 20 MG tablet TAKE 1 TABLET BY MOUTH EVERY DAY   esomeprazole (NEXIUM) 40 MG capsule Take 1 capsule (40 mg total) by mouth daily.   lisinopril (ZESTRIL) 5 MG tablet Take 0.5 tablets (2.5 mg total) by mouth daily.   metFORMIN (GLUCOPHAGE-XR) 750 MG 24 hr tablet Take 1 tablet (750 mg total) by mouth daily with breakfast.   naproxen sodium (ALEVE) 220 MG tablet Take 220 mg by mouth daily as  needed.   nystatin ointment (MYCOSTATIN) Apply 1 application topically 2 (two) times daily. To affected area   rosuvastatin (CRESTOR) 40 MG tablet TAKE 1 TABLET BY MOUTH EVERY DAY   Vitamin D, Ergocalciferol, (DRISDOL) 1.25 MG (50000 UNIT) CAPS capsule Take 1 capsule (50,000 Units total) by mouth every 7 (seven) days.   No facility-administered medications prior to visit.    Review of Systems  Neurological:  Negative for tingling and numbness.       Objective    BP 118/77 (BP Location: Left Arm, Patient Position: Sitting, Cuff Size: Normal)   Pulse 72   Temp 97.7 F (36.5 C) (Oral)   Resp 16   Wt 142 lb 14.4 oz (64.8 kg)   LMP 10/25/2009 (Approximate) Comment: tubal ligation  BMI 25.31 kg/m    Physical Exam Cardiovascular:     Pulses:          Dorsalis pedis pulses are 2+ on the right side and 2+ on the left side.       Posterior tibial pulses are 2+ on the right side and 2+ on the left side.  Musculoskeletal:     Right lower leg: Tenderness and bony tenderness present. No swelling or deformity. No edema.     Left lower leg: Tenderness and bony tenderness present. No swelling or deformity. No edema.     Right ankle: No swelling, deformity, ecchymosis or lacerations. Tenderness present. No lateral malleolus, medial malleolus, base of 5th metatarsal or proximal fibula tenderness. Anterior drawer test  negative. Normal pulse.     Right Achilles Tendon: Normal. No tenderness.     Left ankle: No swelling, deformity, ecchymosis or lacerations. Tenderness present. No lateral malleolus, medial malleolus, base of 5th metatarsal or proximal fibula tenderness. Anterior drawer test negative. Normal pulse.     Left Achilles Tendon: Normal. No tenderness.     Comments: Normal ROM bilaterally but reports pain with ROM bilaterally   Tenderness in lower 1/3 of shin bilaterally, worse on the right   Feet:     Right foot:     Skin integrity: Skin integrity normal. No ulcer, skin breakdown,  warmth, callus, dry skin or fissure.     Toenail Condition: Right toenails are normal.     Left foot:     Skin integrity: Skin integrity normal. No ulcer, skin breakdown, warmth, callus, dry skin or fissure.     Toenail Condition: Left toenails are normal.      No results found for any visits on 10/06/22.   Assessment & Plan     Problem List Items Addressed This Visit       Cardiovascular and Mediastinum   Essential hypertension    BP is within goal range today  She will continue lisinopril 84m daily        BP (high blood pressure)     Other   Myalgia - Primary    New problem  High suspicion that this is related to statins however given the point tenderness along anterior legs and pain with ROM for bilateral ankles R>L, will send for xrays of ankles and tibias bilaterally  Recommended naproxen in the interim if xrays and blood work negative  Will obtain CK and CMP to assess for electrolyte derangements  No recent illness so suspicion for viral induced myalgia is low at this time  She will follow up in 3 weeks       Relevant Orders   Comprehensive metabolic panel   CK (Creatine Kinase)   Other Visit Diagnoses     Bone pain of lower leg       Relevant Orders   DG Tibia/Fibula Right   DG Tibia/Fibula Left   Acute bilateral ankle pain       Relevant Orders   DG Ankle Complete Right   DG Ankle Complete Left        Return in about 3 weeks (around 10/27/2022) for leg pain .     I, MEulis Foster MD, have reviewed all documentation for this visit. The documentation on 10/06/22 for the exam, diagnosis, procedures, and orders are all accurate and complete.    MEulis Foster MD  [phone](phone) [phone](fax)  [place]

## 2022-10-07 ENCOUNTER — Telehealth: Payer: Self-pay

## 2022-10-07 LAB — COMPREHENSIVE METABOLIC PANEL
ALT: 15 IU/L (ref 0–32)
AST: 17 IU/L (ref 0–40)
Albumin/Globulin Ratio: 2 (ref 1.2–2.2)
Albumin: 4.2 g/dL (ref 3.9–4.9)
Alkaline Phosphatase: 80 IU/L (ref 44–121)
BUN/Creatinine Ratio: 24 (ref 12–28)
BUN: 31 mg/dL — ABNORMAL HIGH (ref 8–27)
Bilirubin Total: <0.2 mg/dL (ref 0.0–1.2)
CO2: 21 mmol/L (ref 20–29)
Calcium: 9.8 mg/dL (ref 8.7–10.3)
Chloride: 107 mmol/L — ABNORMAL HIGH (ref 96–106)
Creatinine, Ser: 1.29 mg/dL — ABNORMAL HIGH (ref 0.57–1.00)
Globulin, Total: 2.1 g/dL (ref 1.5–4.5)
Glucose: 116 mg/dL — ABNORMAL HIGH (ref 70–99)
Potassium: 4.5 mmol/L (ref 3.5–5.2)
Sodium: 143 mmol/L (ref 134–144)
Total Protein: 6.3 g/dL (ref 6.0–8.5)
eGFR: 47 mL/min/{1.73_m2} — ABNORMAL LOW (ref >59)

## 2022-10-07 LAB — CK: Total CK: 113 U/L (ref 32–182)

## 2022-10-07 NOTE — Telephone Encounter (Signed)
Pt given lab results per notes of Dr. Quentin Cornwall on 10/07/22. Pt did ask about XR results but advised that provider hasn't reviewed and made comments yet so she will get call back on those. Pt verbalized understanding.

## 2022-10-11 ENCOUNTER — Ambulatory Visit: Payer: BC Managed Care – PPO | Admitting: Family Medicine

## 2022-10-26 NOTE — Progress Notes (Deleted)
      Established patient visit   Patient: Hailey Horton   DOB: 1961-11-21   61 y.o. Female  MRN: [id number] Visit Date: 10/27/2022  Today's healthcare provider: Eulis Foster, MD   No chief complaint on file.  Subjective    HPI  Follow up for Myalgias  The patient was last seen for this 3 weeks ago. Changes made at last visit include recommended naproxen in the interim if xrays and blood work negative   She reports {excellent/good/fair/poor:19665} compliance with treatment. She feels that condition is {improved/worse/unchanged:3041574}. She {is/is not:21021397} having side effects. ***  -----------------------------------------------------------------------------------------   Medications: Outpatient Medications Prior to Visit  Medication Sig   calcium carbonate (TUMS - DOSED IN MG ELEMENTAL CALCIUM) 500 MG chewable tablet Chew 1 tablet by mouth as needed for indigestion or heartburn.   escitalopram (LEXAPRO) 20 MG tablet TAKE 1 TABLET BY MOUTH EVERY DAY   esomeprazole (NEXIUM) 40 MG capsule Take 1 capsule (40 mg total) by mouth daily.   lisinopril (ZESTRIL) 5 MG tablet Take 0.5 tablets (2.5 mg total) by mouth daily.   metFORMIN (GLUCOPHAGE-XR) 750 MG 24 hr tablet Take 1 tablet (750 mg total) by mouth daily with breakfast.   naproxen sodium (ALEVE) 220 MG tablet Take 220 mg by mouth daily as needed.   nystatin ointment (MYCOSTATIN) Apply 1 application topically 2 (two) times daily. To affected area   rosuvastatin (CRESTOR) 40 MG tablet TAKE 1 TABLET BY MOUTH EVERY DAY   Vitamin D, Ergocalciferol, (DRISDOL) 1.25 MG (50000 UNIT) CAPS capsule Take 1 capsule (50,000 Units total) by mouth every 7 (seven) days.   No facility-administered medications prior to visit.    Review of Systems  {Labs  Heme  Chem  Endocrine  Serology  Results Review (optional):23779}   Objective    LMP 10/25/2009 (Approximate) Comment: tubal ligation {Show previous vital signs  (optional):23777}  Physical Exam  ***  No results found for any visits on 10/27/22.  Assessment & Plan     ***  No follow-ups on file.      {provider attestation***:1}   Eulis Foster, MD  [facility] [phone] (phone) [phone] (fax)  [place]

## 2022-10-27 ENCOUNTER — Ambulatory Visit: Payer: BC Managed Care – PPO | Admitting: Family Medicine

## 2022-12-07 ENCOUNTER — Ambulatory Visit: Payer: Self-pay | Admitting: *Deleted

## 2022-12-07 NOTE — Telephone Encounter (Signed)
  Chief Complaint: worsen posterior neck pain Symptoms: reports back of neck hurting constant now. Pain level 8 . Reports she can still do ADLs. Requesting earlier appt. Taking goodies powders and ibuprofen for pain with little relief. Left arm numbness reported comes and goes. Not sure how pain started just woke up with neck pain  Frequency: 6 days  Pertinent Negatives: Patient denies chest pain no difficulty breathing no fever.  Disposition: [] ED /[] Urgent Care (no appt availability in office) / [x] Appointment(In office/virtual)/ []  [place] [facility]/ [] Home Care/ [] Refused Recommended Disposition /[] [place] [facility]/ []  Follow-up with PCP Additional Notes:   Appt scheduled for 12/08/22. Recommended UC /ED if symptoms worsen. Patient reports pain level at 8 but reports she can still do ADLs.     Reason for Disposition  Numbness in an arm or hand (i.e., loss of sensation)  Answer Assessment - Initial Assessment Questions 1. ONSET: "When did the pain begin?"      6 days  2. LOCATION: "Where does it hurt?"      Back of neck pain  3. PATTERN "Does the pain come and go, or has it been constant since it started?"      Constant  4. SEVERITY: "How bad is the pain?"  (Scale 1-10; or mild, moderate, severe)   - NO PAIN (0): no pain or only slight stiffness    - MILD (1-3): doesn't interfere with normal activities    - MODERATE (4-7): interferes with normal activities or awakens from sleep    - SEVERE (8-10):  excruciating pain, unable to do any normal activities      Severe 8 level can do normal activities  5. RADIATION: "Does the pain go anywhere else, shoot into your arms?"     Left arm numbness at times  6. CORD SYMPTOMS: "Any weakness or numbness of the arms or legs?"     Left arm numbness  7. CAUSE: "What do you think is causing the neck pain?"     Not sure  8. NECK OVERUSE: "Any recent activities that involved turning or twisting the neck?"     No  9. OTHER SYMPTOMS: "Do  you have any other symptoms?" (e.g., headache, fever, chest pain, difficulty breathing, neck swelling)     Headaches, posterior neck pain  10. PREGNANCY: "Is there any chance you are pregnant?" "When was your last menstrual period?"       na  Protocols used: Neck Pain or Stiffness-A-AH

## 2022-12-08 ENCOUNTER — Encounter: Payer: Self-pay | Admitting: Physician Assistant

## 2022-12-08 ENCOUNTER — Ambulatory Visit: Payer: BC Managed Care – PPO | Admitting: Physician Assistant

## 2022-12-08 VITALS — BP 152/83 | HR 78 | Temp 97.7°F | Wt 146.8 lb

## 2022-12-08 DIAGNOSIS — R200 Anesthesia of skin: Secondary | ICD-10-CM

## 2022-12-08 DIAGNOSIS — Z23 Encounter for immunization: Secondary | ICD-10-CM | POA: Diagnosis not present

## 2022-12-08 DIAGNOSIS — M542 Cervicalgia: Secondary | ICD-10-CM

## 2022-12-08 DIAGNOSIS — E7849 Other hyperlipidemia: Secondary | ICD-10-CM | POA: Diagnosis not present

## 2022-12-08 DIAGNOSIS — I10 Essential (primary) hypertension: Secondary | ICD-10-CM

## 2022-12-08 MED ORDER — CYCLOBENZAPRINE HCL 5 MG PO TABS: 5.0000 mg | ORAL_TABLET | Freq: Three times a day (TID) | ORAL | 0 refills | Status: DC | PRN
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
## 2022-12-20 IMAGING — CR DG ELBOW COMPLETE 3+V*L*
1 series · 5 of 5 positions shown · non-contrast
Comparison: None.

CLINICAL DATA: Posterior left elbow pain for 3-4 years. No known
injury.

EXAM:
LEFT ELBOW - COMPLETE 3+ VIEW

[Series 1: dg elbow complete left (3+view) · 0.14mm/px · 5 of 5 slices shown]
[im 1/5]
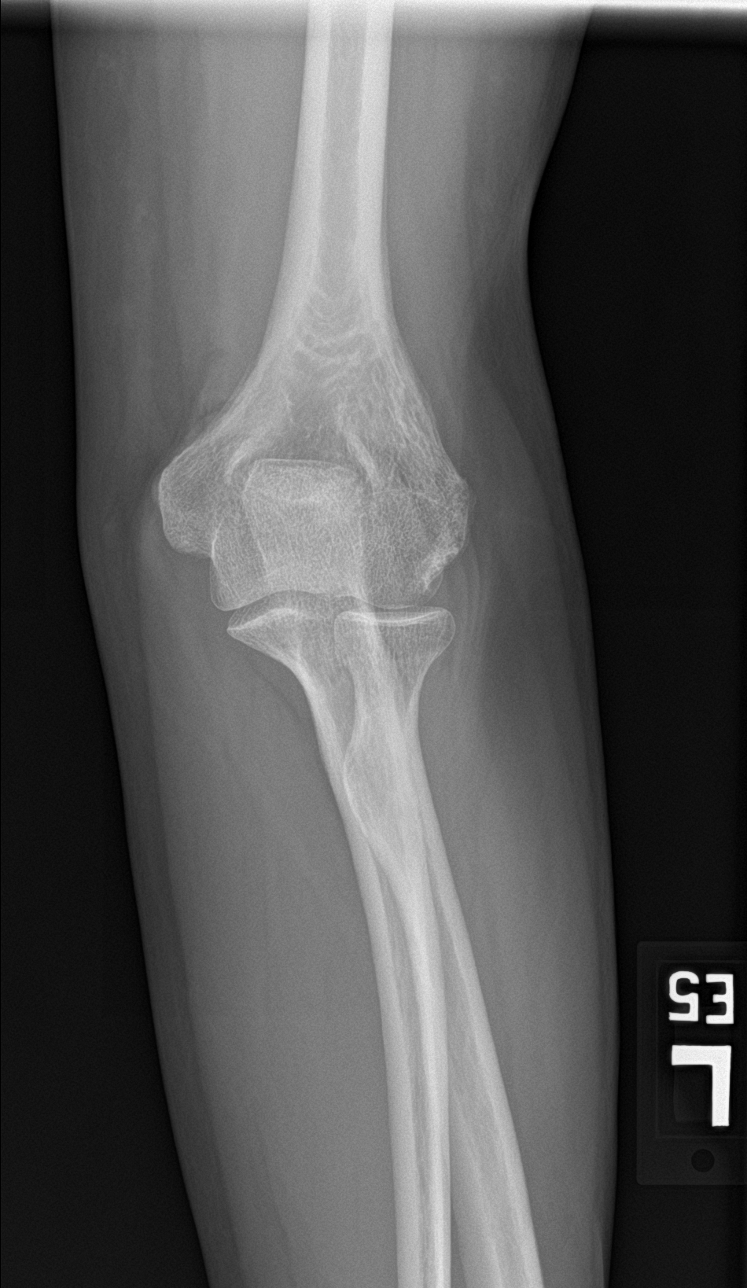
[im 2/5]
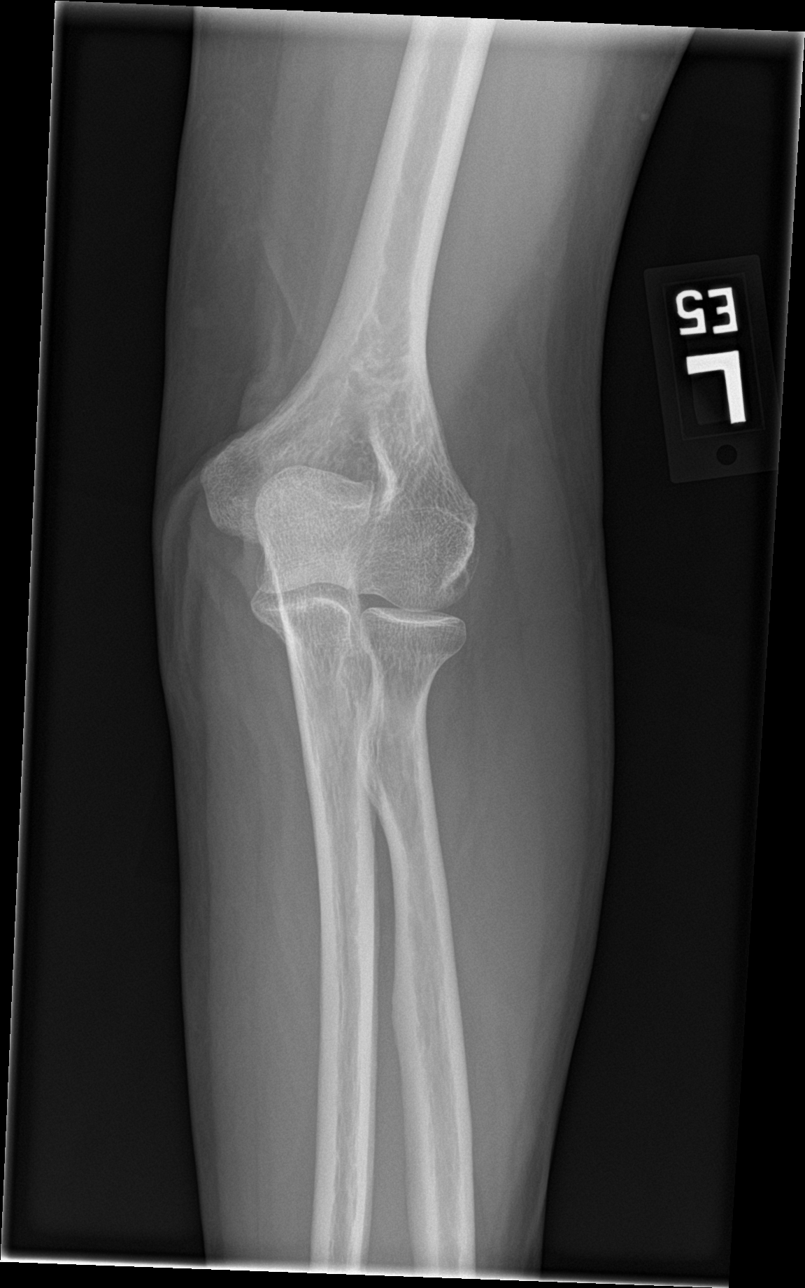
[im 3/5]
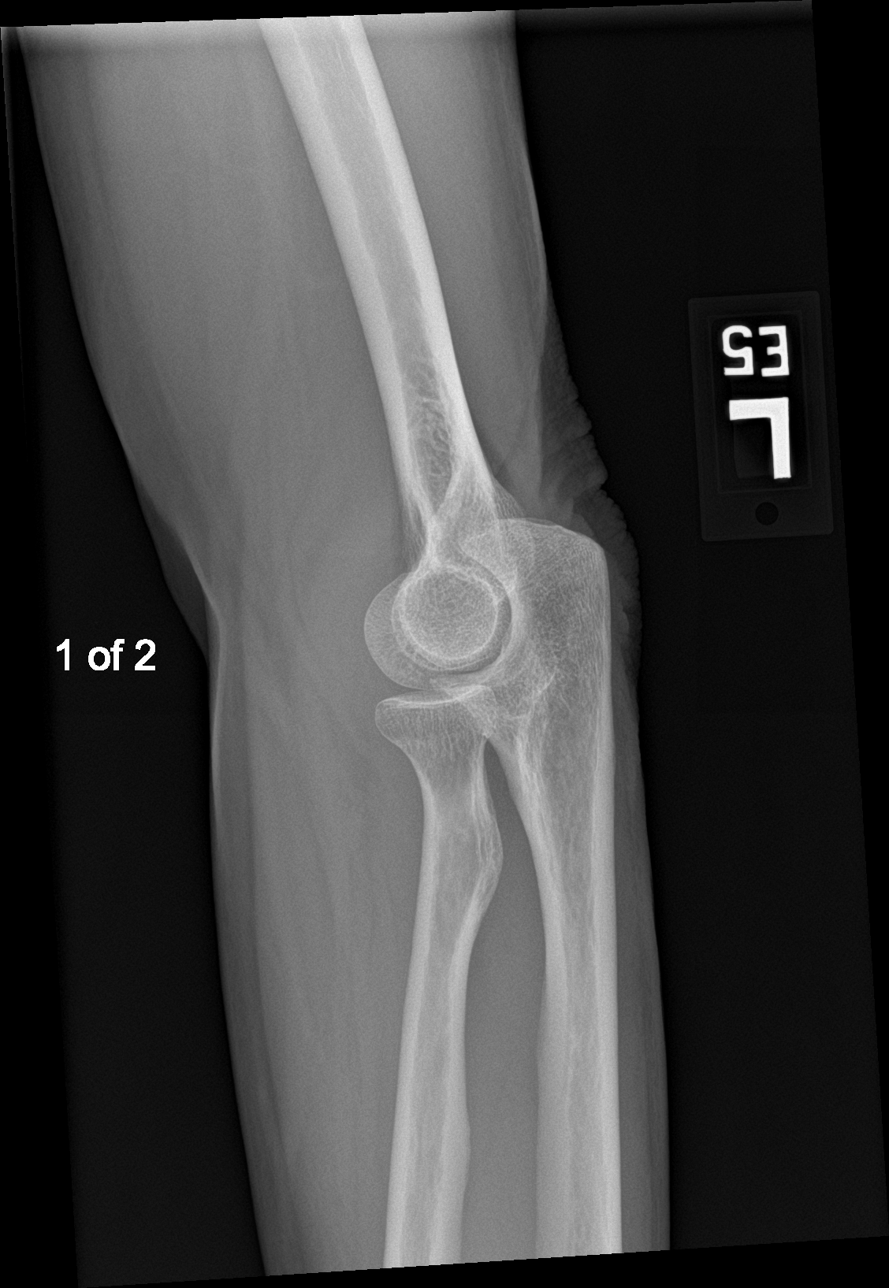
[im 4/5]
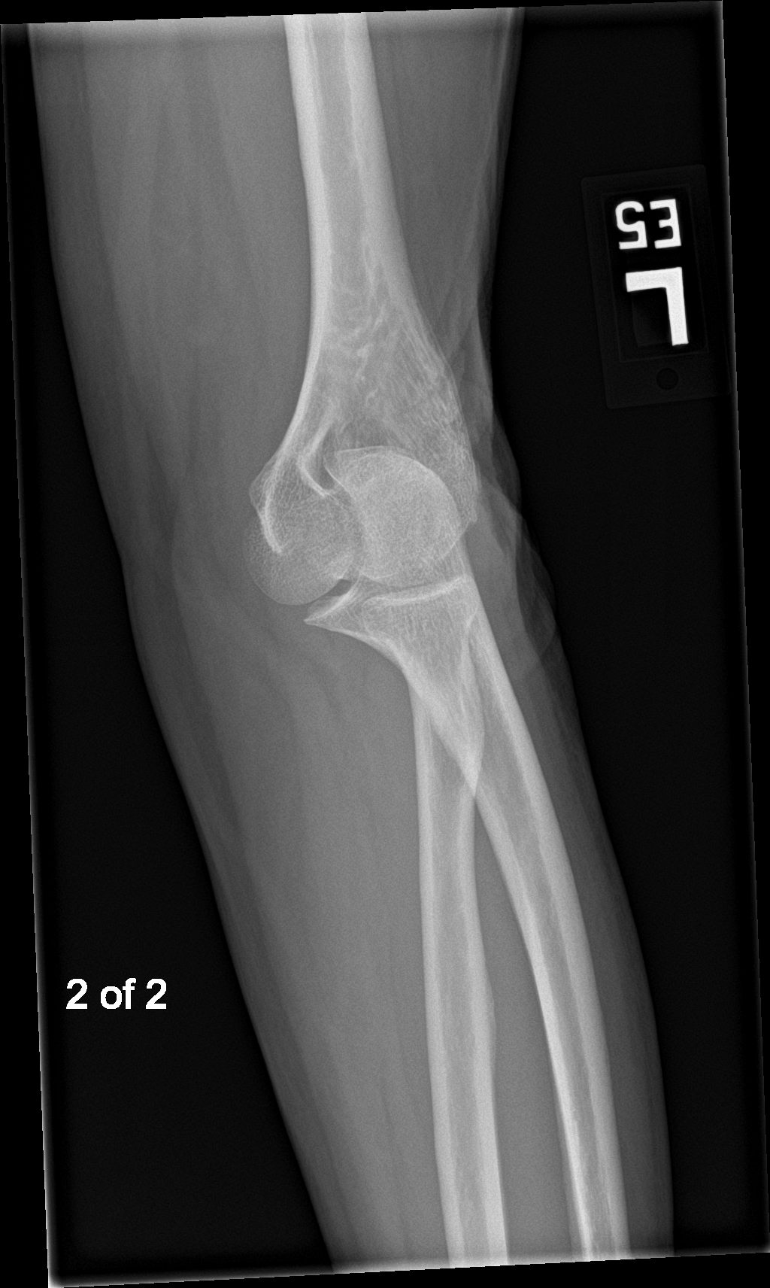
[im 5/5]
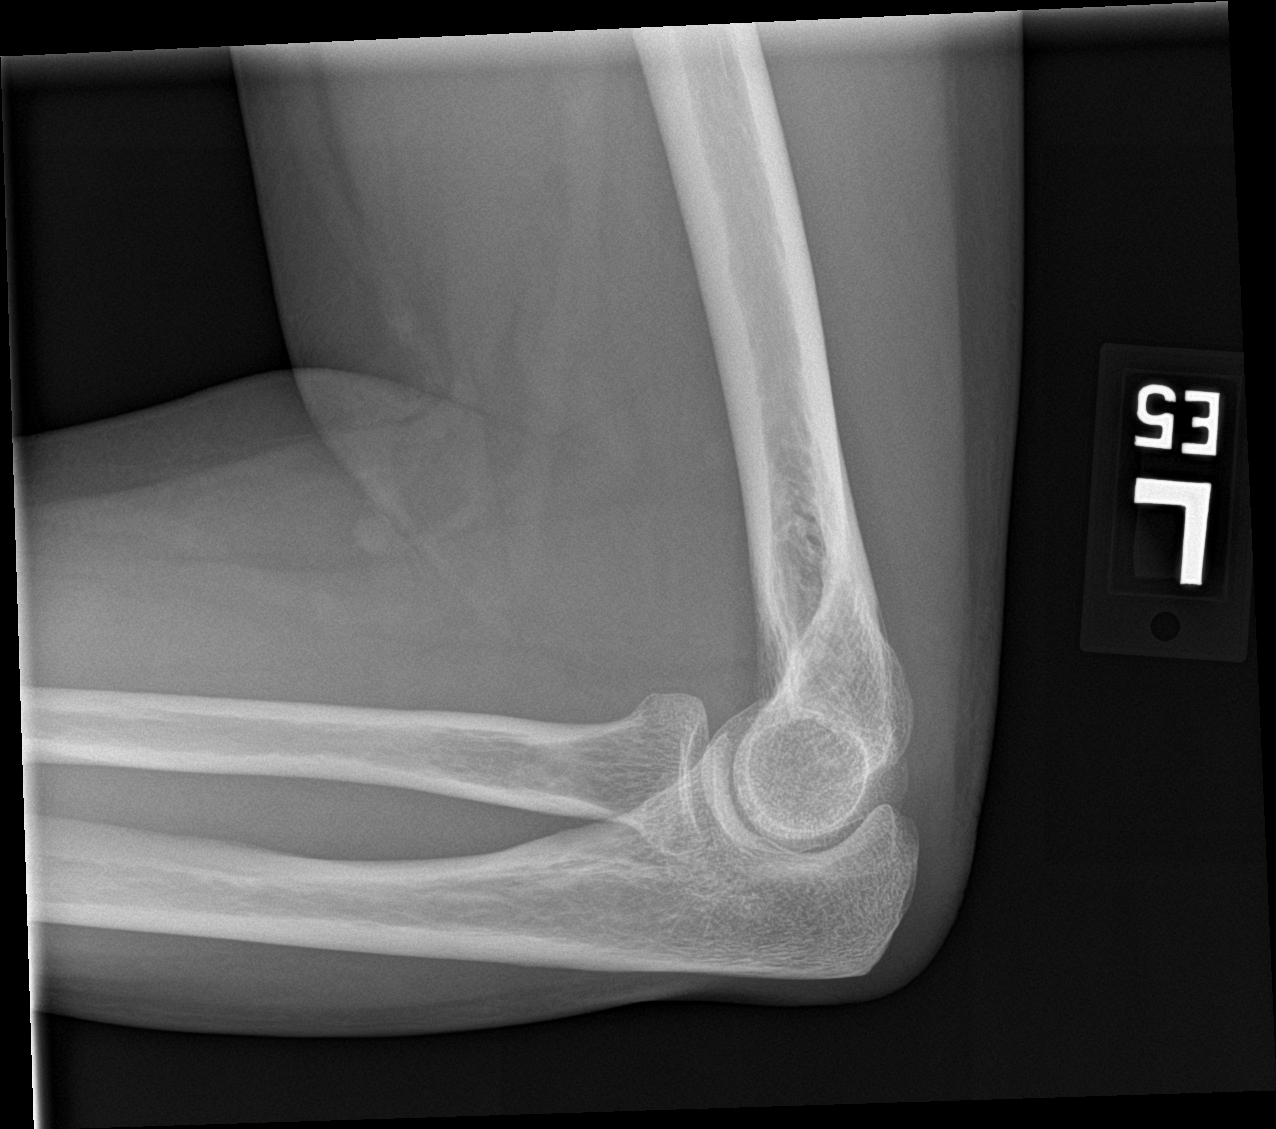

[5 of 5 positions shown; findings below may reference images not displayed]

FINDINGS: There is no evidence of fracture, dislocation, or joint effusion.
There is no evidence of arthropathy or other focal bone abnormality.
Mild subcutaneous edema at the posterior elbow. No well-defined
areas of soft tissue swelling.
IMPRESSION: 1. No acute osseous abnormality.
2. Mild subcutaneous edema at the posterior elbow.

## 2022-12-22 ENCOUNTER — Ambulatory Visit: Payer: BC Managed Care – PPO | Admitting: Family Medicine

## 2022-12-22 ENCOUNTER — Encounter: Payer: Self-pay | Admitting: Family Medicine

## 2022-12-22 VITALS — BP 126/80 | HR 69 | Temp 97.8°F | Resp 16 | Ht 63.0 in | Wt 142.0 lb

## 2022-12-22 DIAGNOSIS — Z789 Other specified health status: Secondary | ICD-10-CM | POA: Insufficient documentation

## 2022-12-22 DIAGNOSIS — E7849 Other hyperlipidemia: Secondary | ICD-10-CM | POA: Diagnosis not present

## 2022-12-22 NOTE — Progress Notes (Signed)
I,Joseline E Rosas,acting as a scribe for [employer], MD.,have documented all relevant documentation on the behalf of Eulis Foster, MD,as directed by  Eulis Foster, MD while in the presence of Eulis Foster, MD.   Established patient visit   Patient: Hailey Horton   DOB: Jul 07, 1961   61 y.o. Female  MRN: [id number] Visit Date: 12/22/2022  Today's healthcare provider: Eulis Foster, MD   Chief Complaint  Patient presents with   Follow-up   Subjective    HPI    Follow Up, HLD  Patient presents for follow up for HLD.  Patient has been reporting myalgias believed to be related to her statin therapy  Was previously on simvastatin. She hasn't started the Crestor medication. Reports still getting dizzy with bending. She reports that she was told this medication would interact with her other medications so she was taken off of the statin  She reports that after one week of not taking any cholesterol lowering medication     She reports some hand swelling after christmas and the swelling has since resolved   Medications: Outpatient Medications Prior to Visit  Medication Sig   calcium carbonate (TUMS - DOSED IN MG ELEMENTAL CALCIUM) 500 MG chewable tablet Chew 1 tablet by mouth as needed for indigestion or heartburn.   cyclobenzaprine (FLEXERIL) 5 MG tablet Take 1 tablet (5 mg total) by mouth 3 (three) times daily as needed for muscle spasms.   escitalopram (LEXAPRO) 20 MG tablet TAKE 1 TABLET BY MOUTH EVERY DAY   esomeprazole (NEXIUM) 40 MG capsule Take 1 capsule (40 mg total) by mouth daily.   lisinopril (ZESTRIL) 5 MG tablet Take 0.5 tablets (2.5 mg total) by mouth daily.   metFORMIN (GLUCOPHAGE-XR) 750 MG 24 hr tablet Take 1 tablet (750 mg total) by mouth daily with breakfast.   naproxen sodium (ALEVE) 220 MG tablet Take 220 mg by mouth daily as needed.   nystatin ointment (MYCOSTATIN) Apply 1 application topically  2 (two) times daily. To affected area   Vitamin D, Ergocalciferol, (DRISDOL) 1.25 MG (50000 UNIT) CAPS capsule Take 1 capsule (50,000 Units total) by mouth every 7 (seven) days.   rosuvastatin (CRESTOR) 40 MG tablet TAKE 1 TABLET BY MOUTH EVERY DAY (Patient not taking: Reported on 12/22/2022)   No facility-administered medications prior to visit.    Review of Systems     Objective    BP 126/80 (BP Location: Left Arm, Patient Position: Sitting, Cuff Size: Normal)   Pulse 69   Temp 97.8 F (36.6 C) (Oral)   Resp 16   Ht 5' 3"  (1.6 m)   Wt 142 lb (64.4 kg)   LMP 10/25/2009 (Approximate) Comment: tubal ligation  BMI 25.15 kg/m    Physical Exam Vitals reviewed.  Constitutional:      General: She is not in acute distress.    Appearance: Normal appearance. She is not ill-appearing, toxic-appearing or diaphoretic.  Eyes:     Conjunctiva/sclera: Conjunctivae normal.  Cardiovascular:     Rate and Rhythm: Normal rate and regular rhythm.     Pulses: Normal pulses.     Heart sounds: Normal heart sounds. No murmur heard.    No friction rub. No gallop.  Pulmonary:     Effort: Pulmonary effort is normal. No respiratory distress.     Breath sounds: Normal breath sounds. No stridor. No wheezing, rhonchi or rales.  Abdominal:     General: Bowel sounds are normal. There is no distension.  Palpations: Abdomen is soft.     Tenderness: There is no abdominal tenderness.  Musculoskeletal:     Right lower leg: No edema.     Left lower leg: No edema.     Comments: 5/5 strength in bilateral lower extremities  No swelling No edema  Normal ROM  No tenderness to palpation   Skin:    Findings: No erythema or rash.  Neurological:     Mental Status: She is alert and oriented to person, place, and time.      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      I, MEulis Foster MD, have reviewed all documentation for this visit.  Portions of this information were initially documented by the CMA and reviewed by me for thoroughness and accuracy.      MEulis Foster MD  [phone](phone) [phone](fax)  [place]

## 2022-12-23 ENCOUNTER — Encounter: Payer: Self-pay | Admitting: Family Medicine

## 2022-12-23 LAB — LIPID PANEL
Chol/HDL Ratio: 4.7 ratio — ABNORMAL HIGH (ref 0.0–4.4)
Cholesterol, Total: 224 mg/dL — ABNORMAL HIGH (ref 100–199)
HDL: 48 mg/dL (ref >39)
LDL Chol Calc (NIH): 138 mg/dL — ABNORMAL HIGH (ref 0–99)
Triglycerides: 210 mg/dL — ABNORMAL HIGH (ref 0–149)
VLDL Cholesterol Cal: 38 mg/dL (ref 5–40)

## 2022-12-23 MED ORDER — REPATHA 140 MG/ML ~~LOC~~ SOSY: 140.0000 mg | PREFILLED_SYRINGE | SUBCUTANEOUS | 5 refills | Status: DC

## 2022-12-23 NOTE — Assessment & Plan Note (Signed)
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

## 2022-12-28 ENCOUNTER — Other Ambulatory Visit: Payer: Self-pay | Admitting: Family Medicine

## 2022-12-28 DIAGNOSIS — I10 Essential (primary) hypertension: Secondary | ICD-10-CM

## 2022-12-28 MED ORDER — LISINOPRIL 5 MG PO TABS: 2.5000 mg | ORAL_TABLET | Freq: Every day | ORAL | 1 refills | Status: DC

## 2022-12-28 NOTE — Telephone Encounter (Signed)
Requested Prescriptions  Pending Prescriptions Disp Refills   lisinopril (ZESTRIL) 5 MG tablet 90 tablet 0    Sig: Take 0.5 tablets (2.5 mg total) by mouth daily.     Cardiovascular:  ACE Inhibitors Failed - 12/28/2022  9:30 AM      Failed - Cr in normal range and within 180 days    Creatinine, Ser  Date Value Ref Range Status  10/06/2022 1.29 (H) 0.57 - 1.00 mg/dL Final         Passed - K in normal range and within 180 days    Potassium  Date Value Ref Range Status  10/06/2022 4.5 3.5 - 5.2 mmol/L Final         Passed - Patient is not pregnant      Passed - Last BP in normal range    BP Readings from Last 1 Encounters:  12/22/22 126/80         Passed - Valid encounter within last 6 months    Recent Outpatient Visits           6 days ago Other hyperlipidemia   [facility] Simmons-Robinson, [place], MD   2 weeks ago Neck pain   [facility] Mikey Kirschner, PA-C   2 months ago [place], MD   3 months ago Essential hypertension   [facility] Simmons-Robinson, [place], MD   6 months ago Annual physical exam   [facility] Jerrol Banana., MD

## 2022-12-28 NOTE — Telephone Encounter (Signed)
Medication Refill - Medication: lisinopril (ZESTRIL) 5 MG tablet   Has the patient contacted their pharmacy? Yes.     Preferred Pharmacy (with phone number or street name):  [id number] [address], [place] Phone: [phone]  Fax: [phone]     Has the patient been seen for an appointment in the last year OR does the patient have an upcoming appointment? Yes.    Agent: Please be advised that RX refills may take up to 3 business days. We ask that you follow-up with your pharmacy.

## 2022-12-29 ENCOUNTER — Telehealth: Payer: Self-pay | Admitting: Family Medicine

## 2022-12-29 DIAGNOSIS — I10 Essential (primary) hypertension: Secondary | ICD-10-CM

## 2022-12-29 MED ORDER — LISINOPRIL 5 MG PO TABS: 2.5000 mg | ORAL_TABLET | Freq: Every day | ORAL | 1 refills | Status: DC

## 2022-12-29 NOTE — Telephone Encounter (Signed)
Hailey Horton   Telephone Encounter Signed   Creation Time: 12/29/2022 11:38 AM   Signed     Pt following up on refill for lisinopril sent .  It looks like the Rx was printed.  Can you resend?

## 2022-12-29 NOTE — Telephone Encounter (Signed)
Pt following up on refill for lisinopril sent .  It looks like the Rx was printed.  Can you resend?

## 2022-12-29 NOTE — Addendum Note (Signed)
Addended by: Matilde Sprang on: 12/29/2022 12:52 PM   Modules accepted: Orders

## 2022-12-29 NOTE — Telephone Encounter (Signed)
Resent in encounter 12/28/21.

## 2022-12-30 ENCOUNTER — Telehealth: Payer: Self-pay | Admitting: *Deleted

## 2022-12-30 ENCOUNTER — Ambulatory Visit: Payer: Self-pay | Admitting: *Deleted

## 2022-12-30 NOTE — Telephone Encounter (Signed)
Patient is calling to let provider know that the Evolocumab (REPATHA) 140 MG/ML SOSY will need prior authorization for insurance to cover. Patient also states the lisinopril is not at pharmacy- I did see where that was marked as print- it has since been corrected and resent. Attempted to verify- was on hold over 10 minutes- had to disconnect call

## 2022-12-30 NOTE — Telephone Encounter (Signed)
  Chief Complaint: diarrhea Symptoms: diarrhea 5 days, nausea, weakness, dark stool, hot/cold Frequency: severe- OTC medication not really helping Pertinent Negatives: Patient denies vomiting Disposition: [] ED /[] Urgent Care (no appt availability in office) / [] Appointment(In office/virtual)/ []  [facility]/ [] Home Care/ [x] Refused Recommended Disposition /[] [facility]/ []  Follow-up with PCP Additional Notes: Patient originally called regarding Rx- but while spealing to patient she c/o diarrhea- advised UC but she will not go- appointment scheduled for Monday- but patient has been advised UC- especially if she gets worse- ED may be required for dehydration.

## 2022-12-30 NOTE — Telephone Encounter (Signed)
Reason for Disposition  [1] SEVERE diarrhea (e.g., 7 or more times / day more than normal) AND [2] age > 60 years  Answer Assessment - Initial Assessment Questions 1. DIARRHEA SEVERITY: "How bad is the diarrhea?" "How many more stools have you had in the past 24 hours than normal?"    - NO DIARRHEA (SCALE 0)   - MILD (SCALE 1-3): Few loose or mushy BMs; increase of 1-3 stools over normal daily number of stools; mild increase in ostomy output.   -  MODERATE (SCALE 4-7): Increase of 4-6 stools daily over normal; moderate increase in ostomy output.   -  SEVERE (SCALE 8-10; OR "WORST POSSIBLE"): Increase of 7 or more stools daily over normal; moderate increase in ostomy output; incontinence.     severe 2. ONSET: "When did the diarrhea begin?"      5-6 days 3. BM CONSISTENCY: "How loose or watery is the diarrhea?"      watery 4. VOMITING: "Are you also vomiting?" If Yes, ask: "How many times in the past 24 hours?"      Nausea- no vomiting 5. ABDOMEN PAIN: "Are you having any abdomen pain?" If Yes, ask: "What does it feel like?" (e.g., crampy, dull, intermittent, constant)      Only once 6. ABDOMEN PAIN SEVERITY: If present, ask: "How bad is the pain?"  (e.g., Scale 1-10; mild, moderate, or severe)   - MILD (1-3): doesn't interfere with normal activities, abdomen soft and not tender to touch    - MODERATE (4-7): interferes with normal activities or awakens from sleep, abdomen tender to touch    - SEVERE (8-10): excruciating pain, doubled over, unable to do any normal activities       no 7. ORAL INTAKE: If vomiting, "Have you been able to drink liquids?" "How much liquids have you had in the past 24 hours?"     Patient reports she has Gatorade/water 8. HYDRATION: "Any signs of dehydration?" (e.g., dry mouth [not just dry lips], too weak to stand, dizziness, new weight loss) "When did you last urinate?"     Weakness, dry lips, 30 minutes ago- urinated 9. EXPOSURE: "Have you traveled to a foreign  country recently?" "Have you been exposed to anyone with diarrhea?" "Could you have eaten any food that was spoiled?"     Family has been sick- off/on 10. ANTIBIOTIC USE: "Are you taking antibiotics now or have you taken antibiotics in the past 2 months?"       no 11. OTHER SYMPTOMS: "Do you have any other symptoms?" (e.g., fever, blood in stool)      BM- Dark in color, hot/cold  Protocols used: Diarrhea-A-AH

## 2023-01-01 ENCOUNTER — Other Ambulatory Visit: Payer: Self-pay | Admitting: Family Medicine

## 2023-01-01 NOTE — Progress Notes (Deleted)
Established patient visit   Patient: Hailey Horton   DOB: [birth year]   62 y.o. Female  MRN: [id number] Visit Date: 01/02/2023  Today's healthcare provider: Mardene Speak, PA-C  CC: HLD FU  Subjective    HPI   Follow Up, HLD  Pt was seen for HLD on 12/22/22. Pt has been reporting having myalgias 2/2 statin therapy. She was previously on simvastatin but did not start on Rx Crestor. Side effects: getting dizzy with bending, hand swelling after Christmas. Resolved after d/c statin therapy. CK was WNL in the past. Was recommended to start repatha RX 140mg  every 14 days On 12/30/22, pt called regarding PA for repatha.    Hyperlipidemia - Primary               Relevant Medications    Evolocumab (REPATHA) 140 MG/ML SOSY    Other Relevant Orders    Lipid panel (Completed)    Statin intolerance    Relevant Medications    Evolocumab (REPATHA) 140 MG/ML SOSY        Medications: Outpatient Medications Prior to Visit  Medication Sig   calcium carbonate (TUMS - DOSED IN MG ELEMENTAL CALCIUM) 500 MG chewable tablet Chew 1 tablet by mouth as needed for indigestion or heartburn.   cyclobenzaprine (FLEXERIL) 5 MG tablet Take 1 tablet (5 mg total) by mouth 3 (three) times daily as needed for muscle spasms.   escitalopram (LEXAPRO) 20 MG tablet TAKE 1 TABLET BY MOUTH EVERY DAY   esomeprazole (NEXIUM) 40 MG capsule Take 1 capsule (40 mg total) by mouth daily.   Evolocumab (REPATHA) 140 MG/ML SOSY Inject 140 mg into the skin every 14 (fourteen) days.   lisinopril (ZESTRIL) 5 MG tablet Take 0.5 tablets (2.5 mg total) by mouth daily.   metFORMIN (GLUCOPHAGE-XR) 750 MG 24 hr tablet Take 1 tablet (750 mg total) by mouth daily with breakfast.   naproxen sodium (ALEVE) 220 MG tablet Take 220 mg by mouth daily as needed.   nystatin ointment (MYCOSTATIN) Apply 1 application topically 2 (two) times daily. To affected area   rosuvastatin (CRESTOR) 40 MG tablet TAKE 1 TABLET BY MOUTH EVERY DAY  (Patient not taking: Reported on 12/22/2022)   Vitamin D, Ergocalciferol, (DRISDOL) 1.25 MG (50000 UNIT) CAPS capsule Take 1 capsule (50,000 Units total) by mouth every 7 (seven) days.   No facility-administered medications prior to visit.    Review of Systems  All other systems reviewed and are negative. Except see HPI  {Labs  Heme  Chem  Endocrine  Serology  Results Review (optional):23779}   Objective    LMP 10/25/2009 (Approximate) Comment: tubal ligation {Show previous vital signs (optional):23777}  Physical Exam Vitals reviewed.  Constitutional:      General: She is not in acute distress.    Appearance: Normal appearance. She is well-developed. She is not diaphoretic.  HENT:     Head: Normocephalic and atraumatic.     Nose: Nose normal.  Eyes:     General: No scleral icterus.    Extraocular Movements: Extraocular movements intact.     Conjunctiva/sclera: Conjunctivae normal.     Pupils: Pupils are equal, round, and reactive to light.  Neck:     Thyroid: No thyromegaly.  Cardiovascular:     Rate and Rhythm: Normal rate and regular rhythm.     Pulses: Normal pulses.     Heart sounds: Normal heart sounds. No murmur heard. Pulmonary:     Effort: Pulmonary effort is normal. No  respiratory distress.     Breath sounds: Normal breath sounds. No wheezing, rhonchi or rales.  Musculoskeletal:        General: No swelling, tenderness, deformity or signs of injury. Normal range of motion.     Cervical back: Normal range of motion and neck supple.     Right lower leg: No edema.     Left lower leg: No edema.  Lymphadenopathy:     Cervical: No cervical adenopathy.  Skin:    General: Skin is warm and dry.     Findings: No rash.  Neurological:     Mental Status: She is alert and oriented to person, place, and time. Mental status is at baseline.  Psychiatric:        Behavior: Behavior normal.        Thought Content: Thought content normal.        Judgment: Judgment normal.      ***  No results found for any visits on 01/02/23.  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 CMP will need to be rechecked. Repatha should be PA , will start PA for repatha.

## 2023-01-02 ENCOUNTER — Ambulatory Visit: Payer: BC Managed Care – PPO | Admitting: Physician Assistant

## 2023-01-02 DIAGNOSIS — Z789 Other specified health status: Secondary | ICD-10-CM

## 2023-01-02 DIAGNOSIS — E7849 Other hyperlipidemia: Secondary | ICD-10-CM

## 2023-01-02 NOTE — Telephone Encounter (Signed)
Patient will need to be rescheduled   Please call to schedule patient and check to see if diarrhea has resolved. Recommend office visit for physical exam if patient still having multiple bowel movements or is unable to tolerate oral fluids, recommend UC or ED evaluation  Eulis Foster, MD  [facility]

## 2023-01-02 NOTE — Telephone Encounter (Signed)
Patient advised as directed below. Patient reports that she will go to the ED.

## 2023-01-03 NOTE — Telephone Encounter (Signed)
PA started today.  

## 2023-01-04 NOTE — Telephone Encounter (Signed)
FYI-patient got approved Message from [place] Approved Effective from 01/03/2023 through 01/02/2024.  LM on patient's voicemail that Repatha was Approved and to call her pharmacy to let them know.

## 2023-01-09 ENCOUNTER — Other Ambulatory Visit: Payer: Self-pay | Admitting: Family Medicine

## 2023-01-09 DIAGNOSIS — E7849 Other hyperlipidemia: Secondary | ICD-10-CM

## 2023-01-09 NOTE — Telephone Encounter (Signed)
Medication Refill - Medication: rosuvastatin (CRESTOR) 40 MG tablet   Has the patient contacted their pharmacy? Yes.   (Agent: If no, request that the patient contact the pharmacy for the refill. If patient does not wish to contact the pharmacy document the reason why and proceed with request.) (Agent: If yes, when and what did the pharmacy advise?)  Preferred Pharmacy (with phone number or street name):  [id number] [address], [place] - [address], [place] [address] Phone: [phone]  Fax: [phone]   Has the patient been seen for an appointment in the last year OR does the patient have an upcoming appointment? Yes.    Agent: Please be advised that RX refills may take up to 3 business days. We ask that you follow-up with your pharmacy.

## 2023-01-10 MED ORDER — ROSUVASTATIN CALCIUM 40 MG PO TABS: 40.0000 mg | ORAL_TABLET | Freq: Every day | ORAL | 0 refills | Status: DC

## 2023-01-10 NOTE — Telephone Encounter (Signed)
Requested Prescriptions  Pending Prescriptions Disp Refills   rosuvastatin (CRESTOR) 40 MG tablet 90 tablet 0    Sig: Take 1 tablet (40 mg total) by mouth daily.     There is no refill protocol information for this order

## 2023-01-17 NOTE — Telephone Encounter (Signed)
[facility] pharmacy advised.

## 2023-02-16 ENCOUNTER — Ambulatory Visit: Payer: Self-pay | Admitting: *Deleted

## 2023-02-16 NOTE — Telephone Encounter (Signed)
Summary: Pt reports that she is having side effects from the  Evolocumab (REPATHA) 140 MG/ML SOSY   Pt reports that she is having side effects from the  Evolocumab (REPATHA) 140 MG/ML SOSY. Pt stated that her lips are cracking and her face is beginning to have some patches. Cb# [phone]       Chief Complaint: possible side effect of medication repatha Symptoms: lips "cracking" worse than chapped lips, skin peeling. Was bleeding not now. Red patches noted on left side of face. "Some" sore throat  Started medication approx 1 month ago . Last dose given 02/11/23 Frequency: 1 month Pertinent Negatives: Patient denies ches pain no difficulty breathing no difficulty swallowing  Disposition: []$ ED /[]$ Urgent Care (no appt availability in office) / [x]$ Appointment(In office/virtual)/ []$  Reform Virtual Care/ []$ Home Care/ []$ Refused Recommended Disposition /[]$ [facility]/ []$  Follow-up with PCP Additional Notes:   Appt scheduled 02/17/23.         Reason for Disposition  [1] Caller has URGENT medicine question about med that PCP or specialist prescribed AND [2] triager unable to answer question  Answer Assessment - Initial Assessment Questions 1. NAME of MEDICINE: "What medicine(s) are you calling about?"     repatha 2. QUESTION: "What is your question?" (e.g., double dose of medicine, side effect)     Patient thinks repatha causing side effects , lips cracking, red patches on left side of face. 3. PRESCRIBER: "Who prescribed the medicine?" Reason: if prescribed by specialist, call should be referred to that group.     PCP 4. SYMPTOMS: "Do you have any symptoms?" If Yes, ask: "What symptoms are you having?"  "How bad are the symptoms (e.g., mild, moderate, severe)     Yes lips cracking and skin peeling from lips, left side of face with red patches 5. PREGNANCY:  "Is there any chance that you are pregnant?" "When was your last menstrual period?"     na  Protocols used: Medication  Question Call-A-AH

## 2023-02-16 NOTE — Progress Notes (Signed)
I,Sulibeya S Dimas,acting as a Education administrator for Lavon Paganini, MD.,have documented all relevant documentation on the behalf of Lavon Paganini, MD,as directed by  Lavon Paganini, MD while in the presence of Lavon Paganini, MD.     Established patient visit   Patient: Hailey Horton   DOB: 04-05-1961   62 y.o. Female  MRN: [id number] Visit Date: 02/17/2023  Today's healthcare provider: Lavon Paganini, MD   Chief Complaint  Patient presents with   Medication Reaction   Subjective    HPI  Follow up for medication  The patient was last seen for this 2 months ago. Changes made at last visit include start [place].  She reports excellent compliance with treatment. She is having side effects. Lips are cracking and her face is beginning to have some patches. She is also having some sore throat.   -----------------------------------------------------------------------------------------   Medications: Outpatient Medications Prior to Visit  Medication Sig   calcium carbonate (TUMS - DOSED IN MG ELEMENTAL CALCIUM) 500 MG chewable tablet Chew 1 tablet by mouth as needed for indigestion or heartburn.   cyclobenzaprine (FLEXERIL) 5 MG tablet Take 1 tablet (5 mg total) by mouth 3 (three) times daily as needed for muscle spasms.   escitalopram (LEXAPRO) 20 MG tablet TAKE 1 TABLET BY MOUTH EVERY DAY   esomeprazole (NEXIUM) 40 MG capsule Take 1 capsule (40 mg total) by mouth daily.   lisinopril (ZESTRIL) 5 MG tablet Take 0.5 tablets (2.5 mg total) by mouth daily.   metFORMIN (GLUCOPHAGE-XR) 750 MG 24 hr tablet TAKE 1 TABLET BY MOUTH EVERY DAY WITH BREAKFAST   naproxen sodium (ALEVE) 220 MG tablet Take 220 mg by mouth daily as needed.   nystatin ointment (MYCOSTATIN) Apply 1 application topically 2 (two) times daily. To affected area   rosuvastatin (CRESTOR) 40 MG tablet Take 1 tablet (40 mg total) by mouth daily.   Vitamin D, Ergocalciferol, (DRISDOL) 1.25 MG (50000 UNIT) CAPS  capsule Take 1 capsule (50,000 Units total) by mouth every 7 (seven) days.   [DISCONTINUED] Evolocumab (REPATHA) 140 MG/ML SOSY Inject 140 mg into the skin every 14 (fourteen) days.   No facility-administered medications prior to visit.    Review of Systems  Constitutional:  Negative for chills and fever.  HENT:  Positive for sore throat.   Respiratory:  Negative for cough, chest tightness and shortness of breath.   Gastrointestinal:  Negative for abdominal pain, nausea and vomiting.  Skin:  Positive for rash.       Objective    BP 118/76 (BP Location: Left Arm, Patient Position: Sitting, Cuff Size: Normal)   Pulse 62   Temp 97.8 F (36.6 C) (Temporal)   Resp 16   Wt 139 lb 12.8 oz (63.4 kg)   LMP 10/25/2009 (Approximate) Comment: tubal ligation  BMI 24.76 kg/m    Physical Exam Vitals reviewed.  Constitutional:      General: She is not in acute distress.    Appearance: She is well-developed.  HENT:     Head: Normocephalic and atraumatic.  Eyes:     General: No scleral icterus.    Conjunctiva/sclera: Conjunctivae normal.  Cardiovascular:     Rate and Rhythm: Normal rate and regular rhythm.  Pulmonary:     Effort: Pulmonary effort is normal. No respiratory distress.  Skin:    General: Skin is warm and dry.     Comments: Few small hives on L cheek, swollen, red and cracking lips  Neurological:     Mental Status: She  is alert and oriented to person, place, and time.  Psychiatric:        Behavior: Behavior normal.       No results found for any visits on 02/17/23.  Assessment & Plan     Problem List Items Addressed This Visit       Endocrine   Hyperlipidemia associated with type 2 diabetes mellitus ([place])    Chronic and uncotnrolled Failed statins x2 2/2 myalgias and repatha 2/2 angioedema Consider zetia - appears patient has never taken this vs intermittent statin dosing (q weekly)        Other   Statin intolerance    Failed simvastatin and crestor       Allergic drug reaction - Primary    Appears to be early angioedema No airway compromise Likely 2/2 repatha as she just started this medication - will d/c Prednisone taper Emergent and return precautions discussed        Return in about 4 weeks (around 03/17/2023) for chronic disease f/u, With PCP.      I, Lavon Paganini, MD, have reviewed all documentation for this visit. The documentation on 02/17/23 for the exam, diagnosis, procedures, and orders are all accurate and complete.   Treonna Klee, Dionne Bucy, MD, MPH [facility]

## 2023-02-17 ENCOUNTER — Ambulatory Visit: Payer: BC Managed Care – PPO | Admitting: Family Medicine

## 2023-02-17 ENCOUNTER — Encounter: Payer: Self-pay | Admitting: Family Medicine

## 2023-02-17 VITALS — BP 118/76 | HR 62 | Temp 97.8°F | Resp 16 | Wt 139.8 lb

## 2023-02-17 DIAGNOSIS — E1169 Type 2 diabetes mellitus with other specified complication: Secondary | ICD-10-CM | POA: Diagnosis not present

## 2023-02-17 DIAGNOSIS — E785 Hyperlipidemia, unspecified: Secondary | ICD-10-CM

## 2023-02-17 DIAGNOSIS — Z789 Other specified health status: Secondary | ICD-10-CM

## 2023-02-17 DIAGNOSIS — T7840XA Allergy, unspecified, initial encounter: Secondary | ICD-10-CM

## 2023-02-17 MED ORDER — PREDNISONE 10 MG PO TABS: ORAL_TABLET | ORAL | 0 refills | Status: DC

## 2023-02-17 NOTE — Assessment & Plan Note (Signed)
Chronic and uncotnrolled Failed statins x2 2/2 myalgias and repatha 2/2 angioedema Consider zetia - appears patient has never taken this vs intermittent statin dosing (q weekly)

## 2023-02-17 NOTE — Assessment & Plan Note (Signed)
Appears to be early angioedema No airway compromise Likely 2/2 repatha as she just started this medication - will d/c Prednisone taper Emergent and return precautions discussed

## 2023-02-17 NOTE — Assessment & Plan Note (Signed)
Failed simvastatin and crestor

## 2023-03-09 ENCOUNTER — Ambulatory Visit: Payer: BC Managed Care – PPO | Admitting: Podiatry

## 2023-03-15 NOTE — Progress Notes (Deleted)
Established patient visit   Patient: Hailey Horton   DOB: 06-15-1961   62 y.o. Female  MRN: [id number] Visit Date: 03/17/2023  Today's healthcare provider: Eulis Foster, MD   No chief complaint on file.  Subjective    HPI  Diabetes Mellitus Type II, follow-up  Lab Results  Component Value Date   HGBA1C 7.0 (H) 06/21/2022   HGBA1C 6.9 (A) 03/08/2022   HGBA1C 7.1 (H) 07/05/2021   Last seen for diabetes 8 months ago.  Management since then includes continuing the same treatment. She reports {excellent/good/fair/poor:19665} compliance with treatment, Metformin 750 mg She {is/is not:21021397} having side effects. {document side effects if present:1}  Home blood sugar records: {diabetes glucometry results:16657}  Episodes of hypoglycemia? {Yes/No:20286} {enter details if yes:1}   Current insulin regiment: none Most Recent Eye Exam: ***  --------------------------------------------------------------------------------------------------- Hypertension, follow-up  BP Readings from Last 3 Encounters:  02/17/23 118/76  12/22/22 126/80  12/08/22 (!) 152/83   Wt Readings from Last 3 Encounters:  02/17/23 139 lb 12.8 oz (63.4 kg)  12/22/22 142 lb (64.4 kg)  12/08/22 146 lb 12.8 oz (66.6 kg)     She was last seen for hypertension 3 months ago.  BP at that visit was 152/83. Management since that visit includes continue Lisinopril. She reports {excellent/good/fair/poor:19665} compliance with treatment. She {is/is not:9024} having side effects. {document side effects if present:1}    Outside blood pressures are {enter patient reported home BP, or 'not being checked':1}.  She does not smoke.  Lipid/Cholesterol, follow-up  Last Lipid Panel: Lab Results  Component Value Date   CHOL 224 (H) 12/22/2022   LDLCALC 138 (H) 12/22/2022   HDL 48 12/22/2022   TRIG 210 (H) 12/22/2022    She was last seen for this 4 weeks ago.  Management since that visit  includes stop repatha due to allergic reaction. Will consider adding zetia.   She reports {excellent/good/fair/poor:19665} compliance with treatment. She {is/is not:9024} having side effects. {document side effects if present:1}  Symptoms: {Yes/No:20286} appetite changes {Yes/No:20286} foot ulcerations  {Yes/No:20286} chest pain {Yes/No:20286} chest pressure/discomfort  {Yes/No:20286} dyspnea {Yes/No:20286} orthopnea  {Yes/No:20286} fatigue {Yes/No:20286} lower extremity edema  {Yes/No:20286} palpitations {Yes/No:20286} paroxysmal nocturnal dyspnea  {Yes/No:20286} nausea {Yes/No:20286} numbness or tingling of extremity  {Yes/No:20286} polydipsia {Yes/No:20286} polyuria  {Yes/No:20286} speech difficulty {Yes/No:20286} syncope   She is following a {diet:21022986} diet. Current exercise: {exercise 99991111  Last metabolic panel Lab Results  Component Value Date   GLUCOSE 116 (H) 10/06/2022   NA 143 10/06/2022   K 4.5 10/06/2022   BUN 31 (H) 10/06/2022   CREATININE 1.29 (H) 10/06/2022   EGFR 47 (L) 10/06/2022   GFRNONAA 86 09/16/2020   CALCIUM 9.8 10/06/2022   AST 17 10/06/2022   ALT 15 10/06/2022   The 10-year ASCVD risk score (Arnett DK, et al., 2019) is: 11.2%  ---------------------------------------------------------------------------------------------------   Medications: Outpatient Medications Prior to Visit  Medication Sig   calcium carbonate (TUMS - DOSED IN MG ELEMENTAL CALCIUM) 500 MG chewable tablet Chew 1 tablet by mouth as needed for indigestion or heartburn.   cyclobenzaprine (FLEXERIL) 5 MG tablet Take 1 tablet (5 mg total) by mouth 3 (three) times daily as needed for muscle spasms.   escitalopram (LEXAPRO) 20 MG tablet TAKE 1 TABLET BY MOUTH EVERY DAY   esomeprazole (NEXIUM) 40 MG capsule Take 1 capsule (40 mg total) by mouth daily.   lisinopril (ZESTRIL) 5 MG tablet Take 0.5 tablets (2.5 mg total) by mouth daily.  metFORMIN (GLUCOPHAGE-XR) 750 MG 24 hr  tablet TAKE 1 TABLET BY MOUTH EVERY DAY WITH BREAKFAST   naproxen sodium (ALEVE) 220 MG tablet Take 220 mg by mouth daily as needed.   predniSONE (DELTASONE) 10 MG tablet Take 60mg  PO daily x1d, then 50mg  daily x1d, then 40mg  daily x1d, then 30mg  daily x1d, then 20mg  daily x1d, then 10mg  daily x1d, then stop   Vitamin D, Ergocalciferol, (DRISDOL) 1.25 MG (50000 UNIT) CAPS capsule Take 1 capsule (50,000 Units total) by mouth every 7 (seven) days.   No facility-administered medications prior to visit.    Review of Systems  {Labs  Heme  Chem  Endocrine  Serology  Results Review (optional):23779}   Objective    LMP 10/25/2009 (Approximate) Comment: tubal ligation {Show previous vital signs (optional):23777}  Physical Exam  ***  No results found for any visits on 03/17/23.  Assessment & Plan     Problem List Items Addressed This Visit   None   No follow-ups on file.        The entirety of the information documented in the History of Present Illness, Review of Systems and Physical Exam were personally obtained by me. Portions of this information were initially documented by *** . I, Eulis Foster, MD have reviewed the documentation above for thoroughness and accuracy.      Eulis Foster, MD  [facility] [phone] (phone) [phone] (fax)  [place]

## 2023-03-17 ENCOUNTER — Encounter: Payer: Self-pay | Admitting: Podiatry

## 2023-03-17 ENCOUNTER — Ambulatory Visit (INDEPENDENT_AMBULATORY_CARE_PROVIDER_SITE_OTHER): Payer: BC Managed Care – PPO

## 2023-03-17 ENCOUNTER — Ambulatory Visit: Payer: BC Managed Care – PPO | Admitting: Podiatry

## 2023-03-17 ENCOUNTER — Ambulatory Visit: Payer: BC Managed Care – PPO | Admitting: Family Medicine

## 2023-03-17 DIAGNOSIS — M216X1 Other acquired deformities of right foot: Secondary | ICD-10-CM

## 2023-03-17 DIAGNOSIS — M7751 Other enthesopathy of right foot: Secondary | ICD-10-CM

## 2023-03-17 DIAGNOSIS — M7752 Other enthesopathy of left foot: Secondary | ICD-10-CM

## 2023-03-17 NOTE — Progress Notes (Signed)
Subjective:  Patient ID: Hailey Horton, female    DOB: July 24, 1961,  MRN: [id number]  Chief Complaint  Patient presents with   Foot Pain    "I had surgery on my right foot years ago.  Now my foot hurts." N - foot pain L - 1st met right D - couple years O - suddenly, gotten worse C - sharp pain, throb A - walking T - Ibuprofen    62 y.o. female presents with the above complaint.  Patient presents with pain to the right first metatarsophalangeal joint has been going for quite some time.  Rest of the HPI as above   Review of Systems: Negative except as noted in the HPI. Denies N/V/F/Ch.  Past Medical History:  Diagnosis Date   Allergy    Anxiety    Bronchitis    Bulging lumbar disc    DDD (degenerative disc disease), cervical    Depression    Diabetes mellitus without complication (HCC)    GERD (gastroesophageal reflux disease)    Hepatitis A    History of anemia    teenager   History of colon polyps    History of Lyme disease    History of [place] spotted fever    Hyperlipidemia    Hypertension    IBS (irritable bowel syndrome)    Left leg numbness    NASH (nonalcoholic steatohepatitis)    Otitis media    Left   PONV (postoperative nausea and vomiting)    prolonged sedation, blurry vision and headache for 6 months after   Sleep apnea    mild, no machine prescribed at this time   Tick bite    history of   TMJ (dislocation of temporomandibular joint)    VIN II (vulvar intraepithelial neoplasia II)    Wears glasses     Current Outpatient Medications:    calcium carbonate (TUMS - DOSED IN MG ELEMENTAL CALCIUM) 500 MG chewable tablet, Chew 1 tablet by mouth as needed for indigestion or heartburn., Disp: , Rfl:    cyclobenzaprine (FLEXERIL) 5 MG tablet, Take 1 tablet (5 mg total) by mouth 3 (three) times daily as needed for muscle spasms., Disp: 30 tablet, Rfl: 0   escitalopram (LEXAPRO) 20 MG tablet, TAKE 1 TABLET BY MOUTH EVERY DAY, Disp: 90 tablet, Rfl:  3   esomeprazole (NEXIUM) 40 MG capsule, Take 1 capsule (40 mg total) by mouth daily., Disp: 90 capsule, Rfl: 0   metFORMIN (GLUCOPHAGE-XR) 750 MG 24 hr tablet, TAKE 1 TABLET BY MOUTH EVERY DAY WITH BREAKFAST, Disp: 90 tablet, Rfl: 0   naproxen sodium (ALEVE) 220 MG tablet, Take 220 mg by mouth daily as needed., Disp: , Rfl:    Vitamin D, Ergocalciferol, (DRISDOL) 1.25 MG (50000 UNIT) CAPS capsule, Take 1 capsule (50,000 Units total) by mouth every 7 (seven) days., Disp: 12 capsule, Rfl: 3   lisinopril (ZESTRIL) 5 MG tablet, Take 1 tablet (5 mg total) by mouth daily., Disp: 90 tablet, Rfl: 1   predniSONE (DELTASONE) 10 MG tablet, Take 60mg  PO daily x1d, then 50mg  daily x1d, then 40mg  daily x1d, then 30mg  daily x1d, then 20mg  daily x1d, then 10mg  daily x1d, then stop (Patient not taking: Reported on 03/17/2023), Disp: 21 tablet, Rfl: 0  Social History   Tobacco Use  Smoking Status Never  Smokeless Tobacco Never    Allergies  Allergen Reactions   Shellfish Allergy Anaphylaxis   Acetaminophen-Codeine    Bee Venom Swelling    Swelling mouth  Clarithromycin     GI upset   Codeine Phosphate     REACTION: difficulty breathing; vomiting   Hydrochlorothiazide    Other     Blue cheese N/V, diarrhea   Peanuts  [Peanut Oil] Swelling   Penicillins     REACTION: difficulty breathing; rash; vomiting   Repatha [Evolocumab] Hives and Other (See Comments)    Lip swelling   Sulfacetamide Sodium     REACTION: difficulty breathing, rash, vomiting   Amoxicillin Rash   Sulfa Antibiotics Rash   Objective:  There were no vitals filed for this visit. There is no height or weight on file to calculate BMI. Constitutional Well developed. Well nourished.  Vascular Dorsalis pedis pulses palpable bilaterally. Posterior tibial pulses palpable bilaterally. Capillary refill normal to all digits.  No cyanosis or clubbing noted. Pedal hair growth normal.  Neurologic Normal speech. Oriented to person,  place, and time. Epicritic sensation to light touch grossly present bilaterally.  Dermatologic Nails well groomed and normal in appearance. No open wounds. No skin lesions.  Orthopedic: Pain on palpation right first metatarsophalangeal joint pain with range of motion of the joint  deep intra-articular pain noted.  Some crepitus clinically appreciated Limited range of motion noted of the first metatarsophalangeal joint   Radiographs: 3 views of skeletally mature adult right foot previous bunion surgery noted osteoarthritis clinically noted on x-ray.  Uneven joint space narrowing noted.  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 Capsulitis of metatarsophalangeal (MTP) joint of right foot    Plan:  Patient was evaluated and treated and all questions answered.  Left first MTP arthritis with underlying capsulitis -All questions and concerns were discussed with the patient in extensive detail given the amount of pain that she is having she will benefit from a steroid injection of decrease acute inflammatory component associate with pain.  Patient agrees with plan like to proceed with steroid injection. -A steroid injection was performed at Left first MPJ using 1% plain Lidocaine and 10 mg of Kenalog. This was well tolerated. -I discussed shoe gear modification with her   No follow-ups on file.

## 2023-03-20 ENCOUNTER — Telehealth: Payer: Self-pay | Admitting: Family Medicine

## 2023-03-20 DIAGNOSIS — I10 Essential (primary) hypertension: Secondary | ICD-10-CM

## 2023-03-20 NOTE — Telephone Encounter (Signed)
  Pt reports that she was told during one of her appts to take 1 tablet daily but the directions on the bottle stated take 0.5 tablet daily so the pharmacy told her to contact provider  Medication Refill - Medication: lisinopril (ZESTRIL) 5 MG tablet   Has the patient contacted their pharmacy? Yes.   Pt told to contact provider  Preferred Pharmacy (with phone number or street name):  [id number] - [place], [place] Phone: [phone]  Fax: [phone]     Has the patient been seen for an appointment in the last year OR does the patient have an upcoming appointment? Yes.    Agent: Please be advised that RX refills may take up to 3 business days. We ask that you follow-up with your pharmacy.

## 2023-03-21 MED ORDER — LISINOPRIL 5 MG PO TABS: 5.0000 mg | ORAL_TABLET | Freq: Every day | ORAL | 1 refills | Status: DC

## 2023-03-21 NOTE — Telephone Encounter (Signed)
Patient returned call stated she is taking lisinopril (ZESTRIL) 5 MG tablet. She is no longer taking half the pill she is taking the whole 5mg  pill but took her last pill on Friday March 23rd and will not be able to refill until April 9th. She also wants to see if she can get suggestions for an over the counter medication for her Cholesterol as she does not want to take the injection.

## 2023-03-21 NOTE — Telephone Encounter (Signed)
Will send refills for her lisinopril 5mg .   Recommend office appt for further medication recommendations on cholesterol.   Zetia is an option but is a prescription medication.   Eulis Foster, MD  [facility]

## 2023-03-21 NOTE — Telephone Encounter (Signed)
Contacted patient. No answer.   Recommended she return call to report which dose she is taking.  Her last BP was controlled so I recommend she continue the current dose she is taking.   Eulis Foster, MD  [facility]

## 2023-03-21 NOTE — Addendum Note (Signed)
Addended by: Adolph Pollack on: 03/21/2023 01:53 PM   Modules accepted: Orders

## 2023-03-21 NOTE — Telephone Encounter (Signed)
LMTCB-Ok for [facility] Nurse to advise 

## 2023-03-24 NOTE — Telephone Encounter (Signed)
NA-not able to LM

## 2023-03-27 NOTE — Telephone Encounter (Signed)
Patient has appointment scheduled for tomorrow.

## 2023-03-27 NOTE — Progress Notes (Deleted)
      Established patient visit   Patient: Hailey Horton   DOB: May 02, 1961   62 y.o. Female  MRN: [id number] Visit Date: 03/28/2023  Today's healthcare provider: Eulis Foster, MD   No chief complaint on file.  Subjective    HPI  Patient coming in to discuss cholesterol medication.  Medications: Outpatient Medications Prior to Visit  Medication Sig   calcium carbonate (TUMS - DOSED IN MG ELEMENTAL CALCIUM) 500 MG chewable tablet Chew 1 tablet by mouth as needed for indigestion or heartburn.   cyclobenzaprine (FLEXERIL) 5 MG tablet Take 1 tablet (5 mg total) by mouth 3 (three) times daily as needed for muscle spasms.   escitalopram (LEXAPRO) 20 MG tablet TAKE 1 TABLET BY MOUTH EVERY DAY   esomeprazole (NEXIUM) 40 MG capsule Take 1 capsule (40 mg total) by mouth daily.   lisinopril (ZESTRIL) 5 MG tablet Take 1 tablet (5 mg total) by mouth daily.   metFORMIN (GLUCOPHAGE-XR) 750 MG 24 hr tablet TAKE 1 TABLET BY MOUTH EVERY DAY WITH BREAKFAST   naproxen sodium (ALEVE) 220 MG tablet Take 220 mg by mouth daily as needed.   predniSONE (DELTASONE) 10 MG tablet Take 60mg  PO daily x1d, then 50mg  daily x1d, then 40mg  daily x1d, then 30mg  daily x1d, then 20mg  daily x1d, then 10mg  daily x1d, then stop (Patient not taking: Reported on 03/17/2023)   Vitamin D, Ergocalciferol, (DRISDOL) 1.25 MG (50000 UNIT) CAPS capsule Take 1 capsule (50,000 Units total) by mouth every 7 (seven) days.   No facility-administered medications prior to visit.    Review of Systems  {Labs  Heme  Chem  Endocrine  Serology  Results Review (optional):23779}   Objective    LMP 10/25/2009 (Approximate) Comment: tubal ligation {Show previous vital signs (optional):23777}  Physical Exam  ***  No results found for any visits on 03/28/23.  Assessment & Plan     ***  No follow-ups on file.      {provider attestation***:1}   Eulis Foster, MD  [facility] [phone] (phone) [phone] (fax)  [place]

## 2023-03-28 ENCOUNTER — Ambulatory Visit: Payer: BC Managed Care – PPO | Admitting: Family Medicine

## 2023-04-04 ENCOUNTER — Other Ambulatory Visit: Payer: Self-pay | Admitting: Family Medicine

## 2023-04-19 ENCOUNTER — Other Ambulatory Visit: Payer: Self-pay | Admitting: Family Medicine

## 2023-04-19 DIAGNOSIS — K219 Gastro-esophageal reflux disease without esophagitis: Secondary | ICD-10-CM

## 2023-04-19 NOTE — Telephone Encounter (Unsigned)
Copied from CRM [id number]. Topic: General - Other >> Apr 19, 2023  4:07 PM Everette C wrote: Reason for CRM: Medication Refill - Medication: esomeprazole (NEXIUM) 40 MG capsule [[id number]]  Has the patient contacted their pharmacy? Yes.   (Agent: If no, request that the patient contact the pharmacy for the refill. If patient does not wish to contact the pharmacy document the reason why and proceed with request.) (Agent: If yes, when and what did the pharmacy advise?)  Preferred Pharmacy (with phone number or street name): [id number] [place], [place] - [address] [place] [address] Phone: [phone] Fax: [phone] Hours: Not open 24 hours   Has the patient been seen for an appointment in the last year OR does the patient have an upcoming appointment? Yes.    Agent: Please be advised that RX refills may take up to 3 business days. We ask that you follow-up with your pharmacy.

## 2023-04-20 MED ORDER — ESOMEPRAZOLE MAGNESIUM 40 MG PO CPDR: 40.0000 mg | DELAYED_RELEASE_CAPSULE | Freq: Every day | ORAL | 0 refills | Status: DC

## 2023-04-20 NOTE — Telephone Encounter (Signed)
Requested Prescriptions  Pending Prescriptions Disp Refills   esomeprazole (NEXIUM) 40 MG capsule 90 capsule 0    Sig: Take 1 capsule (40 mg total) by mouth daily.     Gastroenterology: Proton Pump Inhibitors 2 Passed - 04/19/2023  4:55 PM      Passed - ALT in normal range and within 360 days    ALT  Date Value Ref Range Status  10/06/2022 15 0 - 32 IU/L Final         Passed - AST in normal range and within 360 days    AST  Date Value Ref Range Status  10/06/2022 17 0 - 40 IU/L Final         Passed - Valid encounter within last 12 months    Recent Outpatient Visits           2 months ago Allergic reaction to drug, initial encounter   [facility] Robertson, Marzella Schlein, MD   3 months ago Other hyperlipidemia   [facility] Simmons-Robinson, [place], MD   4 months ago Neck pain   [place] [facility] Alfredia Ferguson, PA-C   6 months ago Myalgia   [place] [facility] Simmons-Robinson, [place], MD   6 months ago Essential hypertension   [place] [place], Tawanna Cooler, MD

## 2023-04-21 ENCOUNTER — Ambulatory Visit: Payer: BC Managed Care – PPO | Admitting: Podiatry

## 2023-05-25 NOTE — Progress Notes (Signed)
I,Vanessa  Vital,acting as a Neurosurgeon for [employer], PA-C.,have documented all relevant documentation on the behalf of Alfredia Ferguson, PA-C,as directed by  Alfredia Ferguson, PA-C while in the presence of Alfredia Ferguson, PA-C.    Established patient visit   Patient: Hailey Horton   DOB: 06-Nov-1961   62 y.o. Female  MRN: [id number] Visit Date: 05/26/2023  Today's healthcare provider: Alfredia Ferguson, PA-C   Cc. Left foot pain after tripping, right foot persistent pain.  Subjective    HPI   Discussed the use of AI scribe software for clinical note transcription with the patient, who gave verbal consent to proceed.  History of Present Illness   The patient presents with bilateral foot pain. The right foot pain was diagnosed as capsulitis of the MTP joint and was previously treated with an injection by a podiatrist, which provided temporary relief. The left foot pain began two and a half weeks ago after a fall caused by a dog, patient reports she thinks she twisted it. The patient reports that the pain is different from the right foot and is located on the top of the foot. The pain is present when walking and occasionally at rest. Denies significant bruising, swelling.  The patient also reports a history of cholesterol medication intolerance. They experienced muscle aches with statins and a lip swelling with Repatha. The patient is apprehensive about trying another cholesterol medication due to these experiences.  The patient also reports anxiety, which is exacerbated by their current living situation with several other people in the house. They previously took Xanax for anxiety and they are currently taking Lexapro but feel it is not completely managing their anxiety.       Medications: Outpatient Medications Prior to Visit  Medication Sig   calcium carbonate (TUMS - DOSED IN MG ELEMENTAL CALCIUM) 500 MG chewable tablet Chew 1 tablet by mouth as needed for indigestion or heartburn.    cyclobenzaprine (FLEXERIL) 5 MG tablet Take 1 tablet (5 mg total) by mouth 3 (three) times daily as needed for muscle spasms.   escitalopram (LEXAPRO) 20 MG tablet TAKE 1 TABLET BY MOUTH EVERY DAY   esomeprazole (NEXIUM) 40 MG capsule Take 1 capsule (40 mg total) by mouth daily.   lisinopril (ZESTRIL) 5 MG tablet Take 1 tablet (5 mg total) by mouth daily.   metFORMIN (GLUCOPHAGE-XR) 750 MG 24 hr tablet TAKE 1 TABLET BY MOUTH EVERY DAY WITH BREAKFAST   naproxen sodium (ALEVE) 220 MG tablet Take 220 mg by mouth daily as needed.   Vitamin D, Ergocalciferol, (DRISDOL) 1.25 MG (50000 UNIT) CAPS capsule Take 1 capsule (50,000 Units total) by mouth every 7 (seven) days.   [DISCONTINUED] predniSONE (DELTASONE) 10 MG tablet Take 60mg  PO daily x1d, then 50mg  daily x1d, then 40mg  daily x1d, then 30mg  daily x1d, then 20mg  daily x1d, then 10mg  daily x1d, then stop (Patient not taking: Reported on 03/17/2023)   No facility-administered medications prior to visit.    Review of Systems  Constitutional:  Negative for fatigue and fever.  Respiratory:  Negative for cough and shortness of breath.   Cardiovascular:  Negative for chest pain and leg swelling.  Gastrointestinal:  Negative for abdominal pain.  Neurological:  Negative for dizziness and headaches.      Objective    BP 130/77 (BP Location: Left Arm, Patient Position: Sitting, Cuff Size: Normal)   Pulse 80   Temp 98.4 F (36.9 C) (Oral)   Ht 5\' 3"  (1.6 m)  Wt 146 lb 1.6 oz (66.3 kg)   LMP 10/25/2009 (Approximate) Comment: tubal ligation  SpO2 97%   BMI 25.88 kg/m  Blood pressure 130/77, pulse 80, temperature 98.4 F (36.9 C), temperature source Oral, height 5\' 3"  (1.6 m), weight 146 lb 1.6 oz (66.3 kg), last menstrual period 10/25/2009, SpO2 97 %.   Physical Exam Vitals reviewed.  Constitutional:      Appearance: She is not ill-appearing.  HENT:     Head: Normocephalic.  Eyes:     Conjunctiva/sclera: Conjunctivae normal.   Cardiovascular:     Rate and Rhythm: Normal rate.  Pulmonary:     Effort: Pulmonary effort is normal. No respiratory distress.  Musculoskeletal:     Comments: Left foot with no edema, ecchymosis, erythema. Nontender w/ full ROM  Neurological:     General: No focal deficit present.     Mental Status: She is alert and oriented to person, place, and time.  Psychiatric:        Mood and Affect: Mood normal.        Behavior: Behavior normal.      No results found for any visits on 05/26/23.  Assessment & Plan     Problem List Items Addressed This Visit       Endocrine   Hyperlipidemia associated with type 2 diabetes mellitus (HCC)    Hyperlipidemia: History of adverse reactions to statins and Repatha.  -Check lipid panel today and will start Zetia      Relevant Orders   Comprehensive Metabolic Panel (CMET)   Lipid Profile   Controlled type 2 diabetes mellitus without complication, without long-term current use of insulin (HCC)    Last A1c 7.0% in June 2023  Repeat A1c today Managed with metformin 750 mg XR Pt just urinated, unable to do uacr today On acei       Relevant Orders   Comprehensive Metabolic Panel (CMET)   HgB A1c     Other   Anxiety    Manages with lexapro 20 mg  Trial of hydroxyzine prn for breakthrough anxiety  Refer to therapy and given more mental health information      Relevant Medications   hydrOXYzine (VISTARIL) 25 MG capsule   Other Relevant Orders   Ambulatory referral to Psychology   Other Visit Diagnoses     Bilateral foot pain    -  Primary      1. Bilateral foot pain Foot Pain: Right foot MTP capsulitis; left foot strain/sprain -Continue with scheduled podiatry appointment and discuss both feet. -Recommend rest, ice, and elevation for left foot. No edema, ecchymosis, tenderness, no indication for imaging as of now.    Return if symptoms worsen or fail to improve, for f/u pending labs w/ PCP.      I, Alfredia Ferguson, PA-C have  reviewed all documentation for this visit. The documentation on  05/26/23   for the exam, diagnosis, procedures, and orders are all accurate and complete.  Alfredia Ferguson, PA-C [facility] [address] [place], [place], [address] Office: [phone] Fax: [phone]   [facility]

## 2023-05-26 ENCOUNTER — Ambulatory Visit: Payer: BC Managed Care – PPO | Admitting: Physician Assistant

## 2023-05-26 ENCOUNTER — Encounter: Payer: Self-pay | Admitting: Physician Assistant

## 2023-05-26 VITALS — BP 130/77 | HR 80 | Temp 98.4°F | Ht 63.0 in | Wt 146.1 lb

## 2023-05-26 DIAGNOSIS — M79672 Pain in left foot: Secondary | ICD-10-CM

## 2023-05-26 DIAGNOSIS — E1169 Type 2 diabetes mellitus with other specified complication: Secondary | ICD-10-CM | POA: Diagnosis not present

## 2023-05-26 DIAGNOSIS — E119 Type 2 diabetes mellitus without complications: Secondary | ICD-10-CM

## 2023-05-26 DIAGNOSIS — F419 Anxiety disorder, unspecified: Secondary | ICD-10-CM

## 2023-05-26 DIAGNOSIS — E785 Hyperlipidemia, unspecified: Secondary | ICD-10-CM | POA: Diagnosis not present

## 2023-05-26 DIAGNOSIS — M79671 Pain in right foot: Secondary | ICD-10-CM

## 2023-05-26 MED ORDER — HYDROXYZINE PAMOATE 25 MG PO CAPS: 25.0000 mg | ORAL_CAPSULE | Freq: Three times a day (TID) | ORAL | 0 refills | Status: DC | PRN

## 2023-05-26 NOTE — Patient Instructions (Addendum)
Other therapists: [email or web address]  Emergency Mental Health Services:  [facility] (like an urgent care for mental health) [address], [place], [place] [address] [phone]  [facility] Several different mental health services, has a crisis center  [place], [place] [address] [place], [place] [address]  [phone] Same-Day Access Hours:  Monday, Wednesday and Friday, 8am - 3pm Crisis & Diversion Center: Walk-In Crisis Hours: 7 days/week, 8am - 12am -  [employer] -  Patent examiner Drop-Off (side door)

## 2023-05-26 NOTE — Assessment & Plan Note (Signed)
Manages with lexapro 20 mg  Trial of hydroxyzine prn for breakthrough anxiety  Refer to therapy and given more mental health information

## 2023-05-26 NOTE — Assessment & Plan Note (Signed)
Hyperlipidemia: History of adverse reactions to statins and Repatha.  -Check lipid panel today and will start Zetia

## 2023-05-26 NOTE — Assessment & Plan Note (Signed)
Last A1c 7.0% in June 2023  Repeat A1c today Managed with metformin 750 mg XR Pt just urinated, unable to do uacr today On acei

## 2023-05-27 LAB — LIPID PANEL
Chol/HDL Ratio: 5.9 ratio — ABNORMAL HIGH (ref 0.0–4.4)
Cholesterol, Total: 285 mg/dL — ABNORMAL HIGH (ref 100–199)
HDL: 48 mg/dL (ref >39)
LDL Chol Calc (NIH): 171 mg/dL — ABNORMAL HIGH (ref 0–99)
Triglycerides: 341 mg/dL — ABNORMAL HIGH (ref 0–149)
VLDL Cholesterol Cal: 66 mg/dL — ABNORMAL HIGH (ref 5–40)

## 2023-05-27 LAB — COMPREHENSIVE METABOLIC PANEL
ALT: 15 IU/L (ref 0–32)
AST: 16 IU/L (ref 0–40)
Albumin/Globulin Ratio: 1.7 (ref 1.2–2.2)
Albumin: 4 g/dL (ref 3.9–4.9)
Alkaline Phosphatase: 111 IU/L (ref 44–121)
BUN/Creatinine Ratio: 22 (ref 12–28)
BUN: 24 mg/dL (ref 8–27)
Bilirubin Total: <0.2 mg/dL (ref 0.0–1.2)
CO2: 24 mmol/L (ref 20–29)
Calcium: 9.5 mg/dL (ref 8.7–10.3)
Chloride: 105 mmol/L (ref 96–106)
Creatinine, Ser: 1.1 mg/dL — ABNORMAL HIGH (ref 0.57–1.00)
Globulin, Total: 2.4 g/dL (ref 1.5–4.5)
Glucose: 147 mg/dL — ABNORMAL HIGH (ref 70–99)
Potassium: 4.5 mmol/L (ref 3.5–5.2)
Sodium: 143 mmol/L (ref 134–144)
Total Protein: 6.4 g/dL (ref 6.0–8.5)
eGFR: 56 mL/min/{1.73_m2} — ABNORMAL LOW (ref >59)

## 2023-05-27 LAB — HEMOGLOBIN A1C
Est. average glucose Bld gHb Est-mCnc: 143 mg/dL
Hgb A1c MFr Bld: 6.6 % — ABNORMAL HIGH (ref 4.8–5.6)

## 2023-05-29 ENCOUNTER — Other Ambulatory Visit: Payer: Self-pay | Admitting: Physician Assistant

## 2023-05-29 DIAGNOSIS — E1169 Type 2 diabetes mellitus with other specified complication: Secondary | ICD-10-CM

## 2023-05-29 MED ORDER — EZETIMIBE 10 MG PO TABS: 10.0000 mg | ORAL_TABLET | Freq: Every day | ORAL | 3 refills | Status: DC

## 2023-05-30 ENCOUNTER — Ambulatory Visit: Payer: BC Managed Care – PPO | Admitting: Podiatry

## 2023-05-30 DIAGNOSIS — R52 Pain, unspecified: Secondary | ICD-10-CM

## 2023-05-30 DIAGNOSIS — M7751 Other enthesopathy of right foot: Secondary | ICD-10-CM

## 2023-05-30 NOTE — Progress Notes (Signed)
Subjective:  Patient ID: Hailey Horton, female    DOB: Jul 07, 1961,  MRN: [id number]  Chief Complaint  Patient presents with   Foot Pain    62 y.o. female presents with the above complaint.  Patient presents with pain to the right first metatarsophalangeal joint has been going for quite some time.  She states that the injection helped considerably.  She would like to do another injection in the left side is having a lot of pain generally across the entire foot and ankle.  She wanted to discuss treatment options  Review of Systems: Negative except as noted in the HPI. Denies N/V/F/Ch.  Past Medical History:  Diagnosis Date   Allergy    Anxiety    Bronchitis    Bulging lumbar disc    DDD (degenerative disc disease), cervical    Depression    Diabetes mellitus without complication (HCC)    GERD (gastroesophageal reflux disease)    Hepatitis A    History of anemia    teenager   History of colon polyps    History of Lyme disease    History of Rocky [place] spotted fever    Hyperlipidemia    Hypertension    IBS (irritable bowel syndrome)    Left leg numbness    NASH (nonalcoholic steatohepatitis)    Otitis media    Left   PONV (postoperative nausea and vomiting)    prolonged sedation, blurry vision and headache for 6 months after   Sleep apnea    mild, no machine prescribed at this time   Tick bite    history of   TMJ (dislocation of temporomandibular joint)    VIN II (vulvar intraepithelial neoplasia II)    Wears glasses     Current Outpatient Medications:    calcium carbonate (TUMS - DOSED IN MG ELEMENTAL CALCIUM) 500 MG chewable tablet, Chew 1 tablet by mouth as needed for indigestion or heartburn., Disp: , Rfl:    cyclobenzaprine (FLEXERIL) 5 MG tablet, Take 1 tablet (5 mg total) by mouth 3 (three) times daily as needed for muscle spasms., Disp: 30 tablet, Rfl: 0   escitalopram (LEXAPRO) 20 MG tablet, TAKE 1 TABLET BY MOUTH EVERY DAY, Disp: 90 tablet, Rfl: 3    esomeprazole (NEXIUM) 40 MG capsule, Take 1 capsule (40 mg total) by mouth daily., Disp: 90 capsule, Rfl: 0   ezetimibe (ZETIA) 10 MG tablet, Take 1 tablet (10 mg total) by mouth daily., Disp: 90 tablet, Rfl: 3   hydrOXYzine (VISTARIL) 25 MG capsule, Take 1 capsule (25 mg total) by mouth every 8 (eight) hours as needed for anxiety., Disp: 15 capsule, Rfl: 0   lisinopril (ZESTRIL) 5 MG tablet, Take 1 tablet (5 mg total) by mouth daily., Disp: 90 tablet, Rfl: 1   metFORMIN (GLUCOPHAGE-XR) 750 MG 24 hr tablet, TAKE 1 TABLET BY MOUTH EVERY DAY WITH BREAKFAST, Disp: 90 tablet, Rfl: 1   naproxen sodium (ALEVE) 220 MG tablet, Take 220 mg by mouth daily as needed., Disp: , Rfl:    Vitamin D, Ergocalciferol, (DRISDOL) 1.25 MG (50000 UNIT) CAPS capsule, Take 1 capsule (50,000 Units total) by mouth every 7 (seven) days., Disp: 12 capsule, Rfl: 3  Social History   Tobacco Use  Smoking Status Never  Smokeless Tobacco Never    Allergies  Allergen Reactions   Shellfish Allergy Anaphylaxis   Acetaminophen-Codeine    Bee Venom Swelling    Swelling mouth   Clarithromycin     GI upset   Codeine  Phosphate     REACTION: difficulty breathing; vomiting   Hydrochlorothiazide    Other     Blue cheese N/V, diarrhea   Peanuts  [Peanut Oil] Swelling   Penicillins     REACTION: difficulty breathing; rash; vomiting   Repatha [Evolocumab] Hives and Other (See Comments)    Lip swelling   Sulfacetamide Sodium     REACTION: difficulty breathing, rash, vomiting   Amoxicillin Rash   Sulfa Antibiotics Rash   Objective:  There were no vitals filed for this visit. There is no height or weight on file to calculate BMI. Constitutional Well developed. Well nourished.  Vascular Dorsalis pedis pulses palpable bilaterally. Posterior tibial pulses palpable bilaterally. Capillary refill normal to all digits.  No cyanosis or clubbing noted. Pedal hair growth normal.  Neurologic Normal speech. Oriented to person,  place, and time. Epicritic sensation to light touch grossly present bilaterally.  Dermatologic Nails well groomed and normal in appearance. No open wounds. No skin lesions.  Orthopedic: Pain on palpation right first metatarsophalangeal joint pain with range of motion of the joint  deep intra-articular pain noted.  Some crepitus clinically appreciated Limited range of motion noted of the first metatarsophalangeal joint  Left foot generalized pain noted at the plantar fascia Achilles tendinitis arch heel.  No focal tenderness noted   Radiographs: 3 views of skeletally mature adult right foot previous bunion surgery noted osteoarthritis clinically noted on x-ray.  Uneven joint space narrowing noted.  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 Capsulitis of metatarsophalangeal (MTP) joint of right foot   2. Generalized pain     Plan:  Patient was evaluated and treated and all questions answered.  Left foot generalized foot and ankle pain - The patient the etiology of generalized pain over his treatment options were discussed I believe she will benefit from cam boot immobilization to allow the soft tissue structure to heal appropriately and narrow/localize her foot pain.  She states understanding -Cam boot was dispensed  Right first MTP arthritis with underlying capsulitis -All questions and concerns were discussed with the patient in extensive detail given the amount of pain that she is having she will benefit from a steroid injection of decrease acute inflammatory component associate with pain.  Patient agrees with plan like to proceed with steroid injection. -A second steroid injection was performed at Left first MPJ using 1% plain Lidocaine and 10 mg of Kenalog. This was well tolerated. -I discussed shoe gear modification with her   No follow-ups on file.   InjectionRight first MTP capsulitis  Left generalized foot and ankle  pain Cam boot immobilization

## 2023-06-05 ENCOUNTER — Other Ambulatory Visit: Payer: Self-pay | Admitting: Family Medicine

## 2023-06-05 DIAGNOSIS — K219 Gastro-esophageal reflux disease without esophagitis: Secondary | ICD-10-CM

## 2023-06-06 ENCOUNTER — Encounter: Payer: Self-pay | Admitting: Podiatry

## 2023-06-06 NOTE — Telephone Encounter (Signed)
Requested Prescriptions  Refused Prescriptions Disp Refills   esomeprazole (NEXIUM) 40 MG capsule [Pharmacy Med Name: ESOMEPRAZOLE MAG DR 40 MG CAP] 90 capsule 0    Sig: TAKE 1 CAPSULE (40 MG TOTAL) BY MOUTH DAILY.     Gastroenterology: Proton Pump Inhibitors 2 Passed - 06/05/2023 12:52 PM      Passed - ALT in normal range and within 360 days    ALT  Date Value Ref Range Status  05/26/2023 15 0 - 32 IU/L Final         Passed - AST in normal range and within 360 days    AST  Date Value Ref Range Status  05/26/2023 16 0 - 40 IU/L Final         Passed - Valid encounter within last 12 months    Recent Outpatient Visits           1 week ago Bilateral foot pain   [place] [facility] Alfredia Ferguson, PA-C   3 months ago Allergic reaction to drug, initial encounter   [facility] [place], Marzella Schlein, MD   5 months ago Other hyperlipidemia   [facility] [address] Simmons-Robinson, [place], MD   6 months ago Neck pain   [facility] Alfredia Ferguson, PA-C   8 months ago Myalgia   [facility] Ronnald Ramp, MD

## 2023-06-08 ENCOUNTER — Telehealth: Payer: Self-pay | Admitting: Family Medicine

## 2023-06-08 NOTE — Telephone Encounter (Signed)
Pt is calling in because the pharmacy said she needed a prior authorization for Vitamin D, Ergocalciferol, (DRISDOL) 1.25 MG (50000 UNIT) CAPS capsule [[id number]] esomeprazole (NEXIUM) 40 MG capsule [[id number]] escitalopram (LEXAPRO) 20 MG tablet [[id number]] .

## 2023-06-09 NOTE — Telephone Encounter (Signed)
I would hold off on Vitamin D until she has had updated levels checked   Ok to proceed with other two agents    Ronnald Ramp, MD  [facility]

## 2023-07-28 ENCOUNTER — Other Ambulatory Visit: Payer: Self-pay | Admitting: Family Medicine

## 2023-07-28 DIAGNOSIS — I10 Essential (primary) hypertension: Secondary | ICD-10-CM

## 2023-07-28 DIAGNOSIS — F32A Depression, unspecified: Secondary | ICD-10-CM

## 2023-07-28 DIAGNOSIS — E559 Vitamin D deficiency, unspecified: Secondary | ICD-10-CM

## 2023-07-28 NOTE — Telephone Encounter (Signed)
Medication Refill - Medication: escitalopram (LEXAPRO) 20 MG tablet and Vitamin D, Ergocalciferol, (DRISDOL) 1.25 MG (50000 UNIT) CAPS capsule   Has the patient contacted their pharmacy? No.  Preferred Pharmacy (with phone number or street name):  [id number] - [place], [place] [address] Phone: [phone]  Fax: [phone]     Has the patient been seen for an appointment in the last year OR does the patient have an upcoming appointment? Yes.    Agent: Please be advised that RX refills may take up to 3 business days. We ask that you follow-up with your pharmacy.

## 2023-07-29 ENCOUNTER — Other Ambulatory Visit: Payer: Self-pay | Admitting: Family Medicine

## 2023-07-29 DIAGNOSIS — F32A Depression, unspecified: Secondary | ICD-10-CM

## 2023-07-31 ENCOUNTER — Telehealth: Payer: Self-pay | Admitting: Family Medicine

## 2023-07-31 MED ORDER — ESCITALOPRAM OXALATE 20 MG PO TABS: 20.0000 mg | ORAL_TABLET | Freq: Every day | ORAL | 0 refills | Status: DC

## 2023-07-31 MED ORDER — VITAMIN D (ERGOCALCIFEROL) 1.25 MG (50000 UNIT) PO CAPS: 50000.0000 [IU] | ORAL_CAPSULE | ORAL | 0 refills | Status: DC

## 2023-07-31 NOTE — Telephone Encounter (Signed)
Last RF 03/21/23 #90 1 RF too soon. Requested Prescriptions  Refused Prescriptions Disp Refills   lisinopril (ZESTRIL) 5 MG tablet [Pharmacy Med Name: LISINOPRIL 5 MG TABLET] 90 tablet 1    Sig: TAKE 1 TABLET (5 MG TOTAL) BY MOUTH DAILY.     Cardiovascular:  ACE Inhibitors Failed - 07/28/2023  3:46 PM      Failed - Cr in normal range and within 180 days    Creatinine, Ser  Date Value Ref Range Status  05/26/2023 1.10 (H) 0.57 - 1.00 mg/dL Final         Passed - K in normal range and within 180 days    Potassium  Date Value Ref Range Status  05/26/2023 4.5 3.5 - 5.2 mmol/L Final         Passed - Patient is not pregnant      Passed - Last BP in normal range    BP Readings from Last 1 Encounters:  05/26/23 130/77         Passed - Valid encounter within last 6 months    Recent Outpatient Visits           2 months ago Bilateral foot pain   [place] [facility] Alfredia Ferguson, PA-C   5 months ago Allergic reaction to drug, initial encounter   [facility] [place], Marzella Schlein, MD   7 months ago Other hyperlipidemia   [facility] Simmons-Robinson, [place], MD   7 months ago Neck pain   [facility] Alfredia Ferguson, PA-C   9 months ago Myalgia   [facility] Ronnald Ramp, MD

## 2023-07-31 NOTE — Telephone Encounter (Signed)
Requested medication (s) are due for refill today: yes  Requested medication (s) are on the active medication list: yes  Last refill:  06/21/22  Future visit scheduled: no  Notes to clinic:   Manual Review: Route requests for 50,000 IU strength to the provider      Requested Prescriptions  Pending Prescriptions Disp Refills   Vitamin D, Ergocalciferol, (DRISDOL) 1.25 MG (50000 UNIT) CAPS capsule 12 capsule 3    Sig: Take 1 capsule (50,000 Units total) by mouth every 7 (seven) days.     Endocrinology:  Vitamins - Vitamin D Supplementation 2 Failed - 07/28/2023  3:58 PM      Failed - Manual Review: Route requests for 50,000 IU strength to the provider      Failed - Vitamin D in normal range and within 360 days    Vit D, 25-Hydroxy  Date Value Ref Range Status  09/16/2020 52.5 30.0 - 100.0 ng/mL Final    Comment:    Vitamin D deficiency has been defined by the Institute of Medicine and an Endocrine Society practice guideline as a level of serum 25-OH vitamin D less than 20 ng/mL (1,2). The Endocrine Society went on to further define vitamin D insufficiency as a level between 21 and 29 ng/mL (2). 1. IOM (Institute of Medicine). 2010. Dietary reference    intakes for calcium and D. [place]: The    [employer]. 2. Holick MF, Binkley [place], Bischoff-Ferrari HA, et al.    Evaluation, treatment, and prevention of vitamin D    deficiency: an Endocrine Society clinical practice    guideline. JCEM. 2011 Jul; 96(7):1911-30.          Passed - Ca in normal range and within 360 days    Calcium  Date Value Ref Range Status  05/26/2023 9.5 8.7 - 10.3 mg/dL Final   Calcium, Ion  Date Value Ref Range Status  10/09/2018 1.21 1.15 - 1.40 mmol/L Final         Passed - Valid encounter within last 12 months    Recent Outpatient Visits           2 months ago Bilateral foot pain   [place] [facility] [place], [place], PA-C   5 months ago Allergic reaction  to drug, initial encounter   [facility] [place], Marzella Schlein, MD   7 months ago Other hyperlipidemia   [place] [facility] Ronnald Ramp, MD   7 months ago Neck pain   [place] [facility] Alfredia Ferguson, PA-C   9 months ago Myalgia   [facility] Simmons-Robinson, [place], MD              Signed Prescriptions Disp Refills   escitalopram (LEXAPRO) 20 MG tablet 90 tablet 0    Sig: Take 1 tablet (20 mg total) by mouth daily.     Psychiatry:  Antidepressants - SSRI Passed - 07/28/2023  3:58 PM      Passed - Completed PHQ-2 or PHQ-9 in the last 360 days      Passed - Valid encounter within last 6 months    Recent Outpatient Visits           2 months ago Bilateral foot pain   [facility] [facility] Alfredia Ferguson, PA-C   5 months ago Allergic reaction to drug, initial encounter   [facility] [place], Marzella Schlein, MD   7 months ago Other  hyperlipidemia   [facility] Simmons-Robinson, [place], MD   7 months ago Neck pain   [facility] Alfredia Ferguson, [place]   9 months ago Myalgia    [facility] [place], Tawanna Cooler, MD

## 2023-07-31 NOTE — Telephone Encounter (Signed)
Requested Prescriptions  Refused Prescriptions Disp Refills   escitalopram (LEXAPRO) 20 MG tablet [Pharmacy Med Name: ESCITALOPRAM 20 MG TABLET] 90 tablet 3    Sig: TAKE 1 TABLET BY MOUTH EVERY DAY     Psychiatry:  Antidepressants - SSRI Passed - 07/29/2023  7:17 PM      Passed - Completed PHQ-2 or PHQ-9 in the last 360 days      Passed - Valid encounter within last 6 months    Recent Outpatient Visits           2 months ago Bilateral foot pain   [place] [place], [place], PA-C   5 months ago Allergic reaction to drug, initial encounter   [facility] [place], Marzella Schlein, MD   7 months ago Other hyperlipidemia   [place] [facility] Simmons-Robinson, [place], MD   7 months ago Neck pain   [facility] Alfredia Ferguson, PA-C   9 months ago Myalgia   [place] [facility] Ronnald Ramp, MD

## 2023-07-31 NOTE — Telephone Encounter (Signed)
Pt called in, checking status of med refill, escitalopram (LEXAPRO) 20 MG tablet  and vitamin d, I let her know of 48-72 hr turn around, and we are working on this

## 2023-07-31 NOTE — Telephone Encounter (Signed)
Requested Prescriptions  Pending Prescriptions Disp Refills   escitalopram (LEXAPRO) 20 MG tablet 90 tablet 0    Sig: Take 1 tablet (20 mg total) by mouth daily.     Psychiatry:  Antidepressants - SSRI Passed - 07/28/2023  3:58 PM      Passed - Completed PHQ-2 or PHQ-9 in the last 360 days      Passed - Valid encounter within last 6 months    Recent Outpatient Visits           2 months ago Bilateral foot pain   [place] [facility] Alfredia Ferguson, PA-C   5 months ago Allergic reaction to drug, initial encounter   [facility] [place], Marzella Schlein, MD   7 months ago Other hyperlipidemia   [place] [facility] Simmons-Robinson, [place], MD   7 months ago Neck pain   [place] [facility] Alfredia Ferguson, PA-C   9 months ago Myalgia   [place] [facility] Simmons-Robinson, [place], MD               Vitamin D, Ergocalciferol, (DRISDOL) 1.25 MG (50000 UNIT) CAPS capsule 12 capsule 3    Sig: Take 1 capsule (50,000 Units total) by mouth every 7 (seven) days.     Endocrinology:  Vitamins - Vitamin D Supplementation 2 Failed - 07/28/2023  3:58 PM      Failed - Manual Review: Route requests for 50,000 IU strength to the provider      Failed - Vitamin D in normal range and within 360 days    Vit D, 25-Hydroxy  Date Value Ref Range Status  09/16/2020 52.5 30.0 - 100.0 ng/mL Final    Comment:    Vitamin D deficiency has been defined by the Institute of Medicine and an Endocrine Society practice guideline as a level of serum 25-OH vitamin D less than 20 ng/mL (1,2). The Endocrine Society went on to further define vitamin D insufficiency as a level between 21 and 29 ng/mL (2). 1. IOM (Institute of Medicine). 2010. Dietary reference    intakes for calcium and D. [place]: The    [employer]. 2. Holick MF, Binkley Bonita, Bischoff-Ferrari HA, et al.    Evaluation, treatment,  and prevention of vitamin D    deficiency: an Endocrine Society clinical practice    guideline. JCEM. 2011 Jul; 96(7):1911-30.          Passed - Ca in normal range and within 360 days    Calcium  Date Value Ref Range Status  05/26/2023 9.5 8.7 - 10.3 mg/dL Final   Calcium, Ion  Date Value Ref Range Status  10/09/2018 1.21 1.15 - 1.40 mmol/L Final         Passed - Valid encounter within last 12 months    Recent Outpatient Visits           2 months ago Bilateral foot pain   [place] [facility] [place], [place], PA-C   5 months ago Allergic reaction to drug, initial encounter   [facility] [place], Marzella Schlein, MD   7 months ago Other hyperlipidemia   [place] [facility] Simmons-Robinson, [place], MD   7 months ago Neck pain   [place] [facility] Alfredia Ferguson, PA-C   9 months ago Myalgia   [place] [facility] Ronnald Ramp, MD

## 2023-08-02 ENCOUNTER — Other Ambulatory Visit: Payer: Self-pay | Admitting: Family Medicine

## 2023-08-02 DIAGNOSIS — K219 Gastro-esophageal reflux disease without esophagitis: Secondary | ICD-10-CM

## 2023-09-22 ENCOUNTER — Ambulatory Visit (INDEPENDENT_AMBULATORY_CARE_PROVIDER_SITE_OTHER): Payer: BC Managed Care – PPO | Admitting: Family Medicine

## 2023-09-22 DIAGNOSIS — Z23 Encounter for immunization: Secondary | ICD-10-CM | POA: Diagnosis not present

## 2023-09-22 HISTORY — DX: Encounter for immunization: Z23

## 2023-09-22 NOTE — Progress Notes (Signed)
Flu vaccine provided by CMA; no PE was completed.

## 2023-10-05 ENCOUNTER — Other Ambulatory Visit: Payer: Self-pay | Admitting: Family Medicine

## 2023-10-28 ENCOUNTER — Other Ambulatory Visit: Payer: Self-pay | Admitting: Family Medicine

## 2023-10-28 DIAGNOSIS — F32A Depression, unspecified: Secondary | ICD-10-CM

## 2023-10-30 NOTE — Telephone Encounter (Signed)
Requested Prescriptions  Pending Prescriptions Disp Refills   escitalopram (LEXAPRO) 20 MG tablet [Pharmacy Med Name: ESCITALOPRAM 20 MG TABLET] 90 tablet 0    Sig: TAKE 1 TABLET BY MOUTH EVERY DAY     Psychiatry:  Antidepressants - SSRI Passed - 10/28/2023  2:39 AM      Passed - Completed PHQ-2 or PHQ-9 in the last 360 days      Passed - Valid encounter within last 6 months    Recent Outpatient Visits           5 months ago Bilateral foot pain   [place] [facility] Alfredia Ferguson, PA-C   8 months ago Allergic reaction to drug, initial encounter   [facility] [place], Marzella Schlein, MD   10 months ago Other hyperlipidemia   [place] [facility] Simmons-Robinson, [place], MD   10 months ago Neck pain   [place] [facility] Alfredia Ferguson, PA-C   1 year ago Myalgia   [place] [facility] Ronnald Ramp, MD

## 2023-10-31 ENCOUNTER — Other Ambulatory Visit: Payer: Self-pay | Admitting: Family Medicine

## 2023-10-31 DIAGNOSIS — K219 Gastro-esophageal reflux disease without esophagitis: Secondary | ICD-10-CM

## 2023-11-01 ENCOUNTER — Telehealth: Payer: Self-pay | Admitting: Family Medicine

## 2023-11-01 NOTE — Telephone Encounter (Signed)
Patient needs refills on Vitamin D and Escitalopram 20 mg. Sent to [facility] on [address].

## 2023-11-03 NOTE — Telephone Encounter (Signed)
Pt will need office visit and labs to determine if refills needed for Vitamin D  Please contact pt to schedule

## 2023-11-03 NOTE — Telephone Encounter (Signed)
Appointment scheduled.

## 2023-11-06 NOTE — Progress Notes (Unsigned)
Established patient visit   Patient: Hailey Horton   DOB: 20-Dec-1961   62 y.o. Female  MRN: [id number] Visit Date: 11/07/2023  Today's healthcare provider: Ronnald Ramp, MD   No chief complaint on file.  Subjective       Discussed the use of AI scribe software for clinical note transcription with the patient, who gave verbal consent to proceed.  History of Present Illness             Past Medical History:  Diagnosis Date   Allergy    Anxiety    Bronchitis    Bulging lumbar disc    DDD (degenerative disc disease), cervical    Depression    Diabetes mellitus without complication (HCC)    GERD (gastroesophageal reflux disease)    Hepatitis A    History of anemia    teenager   History of colon polyps    History of Lyme disease    History of [place] spotted fever    Hyperlipidemia    Hypertension    IBS (irritable bowel syndrome)    Left leg numbness    NASH (nonalcoholic steatohepatitis)    Otitis media    Left   PONV (postoperative nausea and vomiting)    prolonged sedation, blurry vision and headache for 6 months after   Sleep apnea    mild, no machine prescribed at this time   Tick bite    history of   TMJ (dislocation of temporomandibular joint)    VIN II (vulvar intraepithelial neoplasia II)    Wears glasses     Medications: Outpatient Medications Prior to Visit  Medication Sig   calcium carbonate (TUMS - DOSED IN MG ELEMENTAL CALCIUM) 500 MG chewable tablet Chew 1 tablet by mouth as needed for indigestion or heartburn.   cyclobenzaprine (FLEXERIL) 5 MG tablet Take 1 tablet (5 mg total) by mouth 3 (three) times daily as needed for muscle spasms.   escitalopram (LEXAPRO) 20 MG tablet TAKE 1 TABLET BY MOUTH EVERY DAY   esomeprazole (NEXIUM) 40 MG capsule TAKE 1 CAPSULE (40 MG TOTAL) BY MOUTH DAILY.   ezetimibe (ZETIA) 10 MG tablet Take 1 tablet (10 mg total) by mouth daily.   hydrOXYzine (VISTARIL) 25 MG capsule Take 1  capsule (25 mg total) by mouth every 8 (eight) hours as needed for anxiety.   lisinopril (ZESTRIL) 5 MG tablet Take 1 tablet (5 mg total) by mouth daily.   metFORMIN (GLUCOPHAGE-XR) 750 MG 24 hr tablet TAKE 1 TABLET BY MOUTH EVERY DAY WITH BREAKFAST   naproxen sodium (ALEVE) 220 MG tablet Take 220 mg by mouth daily as needed.   Vitamin D, Ergocalciferol, (DRISDOL) 1.25 MG (50000 UNIT) CAPS capsule Take 1 capsule (50,000 Units total) by mouth every 7 (seven) days.   No facility-administered medications prior to visit.    Review of Systems  {Insert previous labs (optional):23779} {See past labs  Heme  Chem  Endocrine  Serology  Results Review (optional):1}   Objective    LMP 10/25/2009 (Approximate) Comment: tubal ligation {Insert last BP/Wt (optional):23777}{See vitals history (optional):1}      Physical Exam  ***  No results found for any visits on 11/07/23.  Assessment & Plan     Problem List Items Addressed This Visit   None   Assessment and Plan              No follow-ups on file.         Tawanna Cooler  Simmons-Robinson, MD  [facility] [phone] (phone) [phone] (fax)  [facility]

## 2023-11-07 ENCOUNTER — Ambulatory Visit: Payer: BC Managed Care – PPO | Admitting: Family Medicine

## 2023-11-07 ENCOUNTER — Encounter: Payer: Self-pay | Admitting: Family Medicine

## 2023-11-07 VITALS — BP 138/84 | HR 64 | Resp 18 | Ht 63.0 in | Wt 151.1 lb

## 2023-11-07 DIAGNOSIS — K7581 Nonalcoholic steatohepatitis (NASH): Secondary | ICD-10-CM

## 2023-11-07 DIAGNOSIS — E1169 Type 2 diabetes mellitus with other specified complication: Secondary | ICD-10-CM

## 2023-11-07 DIAGNOSIS — Z1211 Encounter for screening for malignant neoplasm of colon: Secondary | ICD-10-CM

## 2023-11-07 DIAGNOSIS — E119 Type 2 diabetes mellitus without complications: Secondary | ICD-10-CM | POA: Diagnosis not present

## 2023-11-07 DIAGNOSIS — Z789 Other specified health status: Secondary | ICD-10-CM | POA: Diagnosis not present

## 2023-11-07 DIAGNOSIS — K219 Gastro-esophageal reflux disease without esophagitis: Secondary | ICD-10-CM

## 2023-11-07 DIAGNOSIS — E1159 Type 2 diabetes mellitus with other circulatory complications: Secondary | ICD-10-CM

## 2023-11-07 DIAGNOSIS — Z114 Encounter for screening for human immunodeficiency virus [HIV]: Secondary | ICD-10-CM | POA: Diagnosis not present

## 2023-11-07 DIAGNOSIS — F419 Anxiety disorder, unspecified: Secondary | ICD-10-CM

## 2023-11-07 DIAGNOSIS — F3341 Major depressive disorder, recurrent, in partial remission: Secondary | ICD-10-CM

## 2023-11-07 DIAGNOSIS — I152 Hypertension secondary to endocrine disorders: Secondary | ICD-10-CM

## 2023-11-07 DIAGNOSIS — E785 Hyperlipidemia, unspecified: Secondary | ICD-10-CM

## 2023-11-07 DIAGNOSIS — E559 Vitamin D deficiency, unspecified: Secondary | ICD-10-CM

## 2023-11-07 NOTE — Assessment & Plan Note (Signed)
Unable to tolerate statins and Repatha. Currently on Zetia. Last cholesterol panel on May 26, 2023, showed LDL 171 mg/dL, total cholesterol 742 mg/dL, and triglycerides 595 mg/dL. ASCVD score is 17.3%, indicating a high risk of cardiovascular events. Discussed the importance of monitoring cholesterol levels and the potential benefits of Zetia in reducing cardiovascular risk. - Order lipid panel to assess current cholesterol levels - Continue Zetia 10mg  daily

## 2023-11-07 NOTE — Assessment & Plan Note (Signed)
Last hemoglobin A1c on May 26, 2023, was 6.6%, indicating well-controlled diabetes. Currently on metformin 750 mg daily. Reports occasional dietary indiscretions and inconsistent meal patterns. Encouraged regular physical activity and consistent meal patterns to improve blood sugar control. - chronic  - Order hemoglobin A1c - Encourage consistent meal patterns and reduced intake of sweets - Encourage regular physical activity (20-30 minutes of walking, 3-4 days a week) - Continue metformin 750 mg daily - Perform urine albumin test to monitor kidney health - request for eye exam records sent to [facility]

## 2023-11-07 NOTE — Assessment & Plan Note (Signed)
Monitoring liver function as part of CMP. - chronic  - Order CMP to monitor liver function

## 2023-11-07 NOTE — Assessment & Plan Note (Signed)
Chronic  Stable  Continue lexapro 20mg  daily

## 2023-11-07 NOTE — Assessment & Plan Note (Signed)
Unable to tolerate statin nor repatha due to adverse reactions

## 2023-11-07 NOTE — Assessment & Plan Note (Signed)
Blood pressure today is 138/84 mmHg. Currently on lisinopril 5 mg daily. Goal is to maintain blood pressure under 130/80 mmHg. Discussed the importance of monitoring kidney function due to diabetes history. - chronic, within goal range  - Order CMP to monitor kidney function - Continue lisinopril 5 mg daily

## 2023-11-07 NOTE — Assessment & Plan Note (Signed)
Currently on Nexium 40 mg daily. - chronic, stable  - Continue Nexium 40 mg daily

## 2023-11-07 NOTE — Assessment & Plan Note (Signed)
Currently on Lexapro 20 mg daily and Vistaril 25 mg every 8 hours as needed for anxiety. - Continue Lexapro 20 mg daily - Continue Vistaril 25 mg every 8 hours as needed

## 2023-11-07 NOTE — Patient Instructions (Signed)
VISIT SUMMARY:  Today, we reviewed your overall health, including your diabetes, hypertension, cholesterol levels, liver health, anxiety, depression, and vitamin D deficiency. We discussed your current medications and lifestyle habits, and we made plans to monitor and improve your health.  YOUR PLAN:  -TYPE 2 DIABETES MELLITUS: Type 2 diabetes is a condition where your body does not use insulin properly, leading to high blood sugar levels. Your last hemoglobin A1c was 6.6%, indicating well-controlled diabetes. We encourage you to maintain consistent meal patterns, reduce sweets, and engage in regular physical activity. Continue taking Metformin 750 mg daily. We will order a hemoglobin A1c test and a urine albumin test to monitor your kidney health.  -HYPERTENSION: Hypertension, or high blood pressure, can lead to serious health issues if not controlled. Your blood pressure today was 138/84 mmHg. Continue taking Lisinopril 5 mg daily. We aim to keep your blood pressure under 130/80 mmHg and will order a comprehensive metabolic panel (CMP) to monitor your kidney function.  -HYPERLIPIDEMIA WITH STATIN INTOLERANCE: Hyperlipidemia is high levels of fats (lipids) in the blood, which can increase the risk of heart disease. You are unable to tolerate statins and Repatha, so you are taking Zetia. Your last cholesterol panel showed high levels of LDL and triglycerides. We will order a lipid panel to assess your current cholesterol levels and continue Zetia.  -NON-ALCOHOLIC FATTY LIVER DISEASE: This condition involves fat build-up in the liver not caused by alcohol. We will monitor your liver function as part of the CMP.  -GASTROESOPHAGEAL REFLUX DISEASE (GERD): GERD is a digestive disorder where stomach acid irritates the food pipe lining. Continue taking Nexium 40 mg daily.  -ANXIETY AND DEPRESSION: You are currently managing anxiety and depression with Lexapro 20 mg daily and Vistaril 25 mg every 8 hours as  needed. Continue with your current medications.  -VITAMIN D DEFICIENCY: Vitamin D deficiency means you have lower than normal levels of vitamin D, which is important for bone health and immune function. We did not discuss any changes to your current treatment plan.  -GENERAL HEALTH MAINTENANCE: We discussed the importance of regular screenings and vaccinations. You are up to date on flu and pneumonia vaccines. We recommend the second dose of the Shingrix vaccine for shingles prevention, a referral for a colonoscopy, regular eye exams, and an HIV screening.  INSTRUCTIONS:  Please schedule a follow-up visit in four months. We will provide lab orders for blood work and a urine sample. Additionally, make sure to get the second dose of the Shingrix vaccine.

## 2023-11-08 ENCOUNTER — Emergency Department: Payer: BC Managed Care – PPO

## 2023-11-08 ENCOUNTER — Emergency Department
Admission: EM | Admit: 2023-11-08 | Discharge: 2023-11-08 | Disposition: A | Discharge status: Home / Self Care | Payer: BC Managed Care – PPO | Attending: Emergency Medicine | Admitting: Emergency Medicine

## 2023-11-08 ENCOUNTER — Other Ambulatory Visit: Payer: Self-pay

## 2023-11-08 ENCOUNTER — Encounter: Payer: Self-pay | Admitting: Emergency Medicine

## 2023-11-08 ENCOUNTER — Ambulatory Visit: Payer: Self-pay

## 2023-11-08 DIAGNOSIS — R0789 Other chest pain: Secondary | ICD-10-CM | POA: Insufficient documentation

## 2023-11-08 DIAGNOSIS — R0902 Hypoxemia: Secondary | ICD-10-CM | POA: Diagnosis not present

## 2023-11-08 DIAGNOSIS — R079 Chest pain, unspecified: Secondary | ICD-10-CM | POA: Diagnosis not present

## 2023-11-08 DIAGNOSIS — E119 Type 2 diabetes mellitus without complications: Secondary | ICD-10-CM | POA: Insufficient documentation

## 2023-11-08 DIAGNOSIS — I10 Essential (primary) hypertension: Secondary | ICD-10-CM | POA: Insufficient documentation

## 2023-11-08 LAB — TROPONIN I (HIGH SENSITIVITY)
Troponin I (High Sensitivity): 3 ng/L (ref <18)
Troponin I (High Sensitivity): 3 ng/L (ref <18)

## 2023-11-08 LAB — LIPID PANEL
Chol/HDL Ratio: 4.8 ratio — ABNORMAL HIGH (ref 0.0–4.4)
Cholesterol, Total: 242 mg/dL — ABNORMAL HIGH (ref 100–199)
HDL: 50 mg/dL (ref >39)
LDL Chol Calc (NIH): 139 mg/dL — ABNORMAL HIGH (ref 0–99)
Triglycerides: 295 mg/dL — ABNORMAL HIGH (ref 0–149)
VLDL Cholesterol Cal: 53 mg/dL — ABNORMAL HIGH (ref 5–40)

## 2023-11-08 LAB — MICROALBUMIN / CREATININE URINE RATIO
Creatinine, Urine: 85.7 mg/dL
Microalb/Creat Ratio: 7 mg/g{creat} (ref 0–29)
Microalbumin, Urine: 6.1 ug/mL

## 2023-11-08 LAB — CMP14+EGFR
ALT: 21 [IU]/L (ref 0–32)
AST: 18 [IU]/L (ref 0–40)
Albumin: 4.1 g/dL (ref 3.9–4.9)
Alkaline Phosphatase: 99 [IU]/L (ref 44–121)
BUN/Creatinine Ratio: 24 (ref 12–28)
BUN: 22 mg/dL (ref 8–27)
Bilirubin Total: <0.2 mg/dL (ref 0.0–1.2)
CO2: 22 mmol/L (ref 20–29)
Calcium: 9.7 mg/dL (ref 8.7–10.3)
Chloride: 104 mmol/L (ref 96–106)
Creatinine, Ser: 0.92 mg/dL (ref 0.57–1.00)
Globulin, Total: 2.2 g/dL (ref 1.5–4.5)
Glucose: 73 mg/dL (ref 70–99)
Potassium: 4.5 mmol/L (ref 3.5–5.2)
Sodium: 140 mmol/L (ref 134–144)
Total Protein: 6.3 g/dL (ref 6.0–8.5)
eGFR: 70 mL/min/{1.73_m2} (ref >59)

## 2023-11-08 LAB — CBC
HCT: 32.4 % — ABNORMAL LOW (ref 36.0–46.0)
Hemoglobin: 9.9 g/dL — ABNORMAL LOW (ref 12.0–15.0)
MCH: 23.5 pg — ABNORMAL LOW (ref 26.0–34.0)
MCHC: 30.6 g/dL (ref 30.0–36.0)
MCV: 77 fL — ABNORMAL LOW (ref 80.0–100.0)
Platelets: 367 10*3/uL (ref 150–400)
RBC: 4.21 MIL/uL (ref 3.87–5.11)
RDW: 16.7 % — ABNORMAL HIGH (ref 11.5–15.5)
WBC: 6.4 10*3/uL (ref 4.0–10.5)
nRBC: 0 % (ref 0.0–0.2)

## 2023-11-08 LAB — HEMOGLOBIN A1C
Est. average glucose Bld gHb Est-mCnc: 160 mg/dL
Hgb A1c MFr Bld: 7.2 % — ABNORMAL HIGH (ref 4.8–5.6)

## 2023-11-08 LAB — BASIC METABOLIC PANEL
Anion gap: 9 (ref 5–15)
BUN: 22 mg/dL (ref 8–23)
CO2: 24 mmol/L (ref 22–32)
Calcium: 8.6 mg/dL — ABNORMAL LOW (ref 8.9–10.3)
Chloride: 105 mmol/L (ref 98–111)
Creatinine, Ser: 1 mg/dL (ref 0.44–1.00)
GFR, Estimated: >60 mL/min (ref >60)
Glucose, Bld: 83 mg/dL (ref 70–99)
Potassium: 4.2 mmol/L (ref 3.5–5.1)
Sodium: 138 mmol/L (ref 135–145)

## 2023-11-08 LAB — HIV ANTIBODY (ROUTINE TESTING W REFLEX): HIV Screen 4th Generation wRfx: NONREACTIVE

## 2023-11-08 MED ORDER — ACETAMINOPHEN 500 MG PO TABS
1000.0000 mg | ORAL_TABLET | Freq: Once | ORAL | Status: AC
  Administered 2023-11-08: 1000 mg via ORAL
  Filled 2023-11-08: qty 2

## 2023-11-08 MED ORDER — LIDOCAINE 5 % EX PTCH: 1.0000 | MEDICATED_PATCH | Freq: Two times a day (BID) | CUTANEOUS | 0 refills | Status: DC

## 2023-11-08 MED ORDER — LIDOCAINE 5 % EX PTCH
1.0000 | MEDICATED_PATCH | CUTANEOUS | Status: DC
  Administered 2023-11-08: 1 via TRANSDERMAL
  Filled 2023-11-08: qty 1

## 2023-11-08 NOTE — ED Triage Notes (Signed)
Arrives from home via [facility].  Reports chest pain since yesterday afternoon.  Radiates to back and improves with rest.  324 mg ASA given, 1 spray of NTG with relief.  20g RFA.  Hx HTN, DM, High Chol, GERD.  12 lead wnl.  VS wnl.

## 2023-11-08 NOTE — ED Provider Notes (Signed)
[facility] Provider Note    Event Date/Time   First MD Initiated Contact with Patient 11/08/23 1334     (approximate)   History   Chief Complaint Chest Pain   HPI  Hailey Horton is a 62 y.o. female with past medical history of hypertension, hyperlipidemia, diabetes, GERD, and NASH who presents to the ED complaining of chest pain.  Patient reports that she has been dealing with sharp pain in the center of her chest since yesterday that is worse with exertion.  She denies any fevers, cough, or difficulty breathing and has not noticed any pain or swelling in her legs.  She denies any recent heavy lifting or other trauma to her chest.  She denies any history of similar symptoms, denies personal cardiac history.     Physical Exam   Triage Vital Signs: ED Triage Vitals  Encounter Vitals Group     BP 11/08/23 1114 (!) 141/75     Systolic BP Percentile --      Diastolic BP Percentile --      Pulse Rate 11/08/23 1114 72     Resp 11/08/23 1114 18     Temp 11/08/23 1114 98.1 F (36.7 C)     Temp Source 11/08/23 1114 Oral     SpO2 11/08/23 1114 94 %     Weight 11/08/23 1115 151 lb (68.5 kg)     Height 11/08/23 1115 5\' 3"  (1.6 m)     Head Circumference --      Peak Flow --      Pain Score 11/08/23 1115 8     Pain Loc --      Pain Education --      Exclude from Growth Chart --     Most recent vital signs: Vitals:   11/08/23 1114  BP: (!) 141/75  Pulse: 72  Resp: 18  Temp: 98.1 F (36.7 C)  SpO2: 94%    Constitutional: Alert and oriented. Eyes: Conjunctivae are normal. Head: Atraumatic. Nose: No congestion/rhinnorhea. Mouth/Throat: Mucous membranes are moist.  Cardiovascular: Normal rate, regular rhythm. Grossly normal heart sounds.  2+ radial pulses bilaterally. Respiratory: Normal respiratory effort.  No retractions. Lungs CTAB.  Central chest wall tenderness to palpation noted. Gastrointestinal: Soft and nontender. No  distention. Musculoskeletal: No lower extremity tenderness nor edema.  Neurologic:  Normal speech and language. No gross focal neurologic deficits are appreciated.    ED Results / Procedures / Treatments   Labs (all labs ordered are listed, but only abnormal results are displayed) Labs Reviewed  BASIC METABOLIC PANEL - Abnormal; Notable for the following components:      Result Value   Calcium 8.6 (*)    All other components within normal limits  CBC - Abnormal; Notable for the following components:   Hemoglobin 9.9 (*)    HCT 32.4 (*)    MCV 77.0 (*)    MCH 23.5 (*)    RDW 16.7 (*)    All other components within normal limits  TROPONIN I (HIGH SENSITIVITY)  TROPONIN I (HIGH SENSITIVITY)     EKG  ED ECG REPORT I, Chesley Noon, the attending physician, personally viewed and interpreted this ECG.   Date: 11/08/2023  EKG Time: 11:11  Rate: 74  Rhythm: normal sinus rhythm  Axis: Normal  Intervals:none  ST&T Change: None  RADIOLOGY Chest x-ray reviewed and interpreted by me with no infiltrate, edema, or effusion.  PROCEDURES:  Critical Care performed: No  Procedures   MEDICATIONS  ORDERED IN ED: Medications  lidocaine (LIDODERM) 5 % 1 patch (1 patch Transdermal Patch Applied 11/08/23 1418)  acetaminophen (TYLENOL) tablet 1,000 mg (1,000 mg Oral Given 11/08/23 1417)     IMPRESSION / MDM / ASSESSMENT AND PLAN / ED COURSE  I reviewed the triage vital signs and the nursing notes.                              62 y.o. female with with past medical history of hypertension, hyperlipidemia, diabetes, GERD, and NASH who presents to the ED complaining of sharp pain in the center of her chest since yesterday that is worse with exertion.  Patient's presentation is most consistent with acute presentation with potential threat to life or bodily function.  Differential diagnosis includes, but is not limited to, ACS, PE, pneumonia, pneumothorax, musculoskeletal pain,  GERD, anxiety.  Patient nontoxic-appearing and in no acute distress, vital signs are unremarkable.  EKG shows no evidence of arrhythmia or ischemia and 2 sets of troponin are within normal limits.  I doubt ACS or PE given reassuring vital signs and pain is reproducible with palpation.  Remainder of labs are reassuring with no significant anemia, leukocytosis, electrolyte abnormality, or AKI.  Chest x-ray is also unremarkable, pain improved following Lidoderm patch and Tylenol.  Patient appropriate for discharge home, will refer to cardiology given pain worsening with exertion.  She was counseled to return to the ED for new or worsening symptoms, patient agrees with plan.      FINAL CLINICAL IMPRESSION(S) / ED DIAGNOSES   Final diagnoses:  Nonspecific chest pain     Rx / DC Orders   ED Discharge Orders          Ordered    Ambulatory referral to Cardiology        11/08/23 1511    lidocaine (LIDODERM) 5 %  Every 12 hours        11/08/23 1511             Note:  This document was prepared using Dragon voice recognition software and may include unintentional dictation errors.   Chesley Noon, MD 11/08/23 [phone]

## 2023-11-08 NOTE — ED Triage Notes (Signed)
Patient to ED via [facility] from home for centralized CP. Started yesterday. States dull pain. Denies cardiac hx.

## 2023-11-08 NOTE — Telephone Encounter (Signed)
  Chief Complaint: severe chest pain Symptoms: dizziness Frequency: started yesterday pm Pertinent Negatives: Patient denies SOB, sweating, nausea Disposition: [x] ED /[] Urgent Care (no appt availability in office) / [] Appointment(In office/virtual)/ []  [place]/ [] Home Care/ [] Refused Recommended Disposition /[] [place] [facility]/ []  Follow-up with PCP Additional Notes: called 911 for pt and they are en route. Pt advised to take an additional aspirin and NPO. Reassurance given. Reason for Disposition  [1] Chest pain lasts > 5 minutes AND [2] age > 66  Answer Assessment - Initial Assessment Questions 1. LOCATION: "Where does it hurt?"       Center of chest  2. RADIATION: "Does the pain go anywhere else?" (e.g., into neck, jaw, arms, back)     no 3. ONSET: "When did the chest pain begin?" (Minutes, hours or days)      Yest pm   6. SEVERITY: "How bad is the pain?"  (e.g., Scale 1-10; mild, moderate, or severe)    - MILD (1-3): doesn't interfere with normal activities     - MODERATE (4-7): interferes with normal activities or awakens from sleep    - SEVERE (8-10): excruciating pain, unable to do any normal activities       severe 7. CARDIAC RISK FACTORS: "Do you have any history of heart problems or risk factors for heart disease?" (e.g., angina, prior heart attack; diabetes, high blood pressure, high cholesterol, smoker, or strong family history of heart disease)     no  10. OTHER SYMPTOMS: "Do you have any other symptoms?" (e.g., dizziness, nausea, vomiting, sweating, fever, difficulty breathing, cough)       dizziness  Protocols used: Chest Pain-A-AH

## 2023-11-08 NOTE — Telephone Encounter (Signed)
Agree with ED evaluation for chest pain

## 2023-11-16 ENCOUNTER — Encounter: Payer: Self-pay | Admitting: *Deleted

## 2023-11-28 ENCOUNTER — Telehealth: Payer: Self-pay

## 2023-11-28 NOTE — Telephone Encounter (Signed)
Transition Care Management Unsuccessful Follow-up Telephone Call  Date of discharge and from where:  11/08/2023 [facility]  Attempts:  1st Attempt  Reason for unsuccessful TCM follow-up call:  Unable to leave message  Zyia Kaneko [facility]  [facility] [place] Guide Direct Dial: [phone]  Website: [email or web address]

## 2023-11-29 ENCOUNTER — Telehealth: Payer: Self-pay

## 2023-11-29 NOTE — Telephone Encounter (Signed)
Transition Care Management Unsuccessful Follow-up Telephone Call  Date of discharge and from where:  11/08/2023 [facility]  Attempts:  2nd Attempt  Reason for unsuccessful TCM follow-up call:  Unable to leave message voicemail did not pickup.  Lynard Postlewait [facility]  [facility] Guide Direct Dial: [phone]  Website: [email or web address]

## 2023-12-31 ENCOUNTER — Other Ambulatory Visit: Payer: Self-pay | Admitting: Family Medicine

## 2024-01-12 ENCOUNTER — Other Ambulatory Visit: Payer: Self-pay | Admitting: Family Medicine

## 2024-01-12 DIAGNOSIS — I10 Essential (primary) hypertension: Secondary | ICD-10-CM

## 2024-01-12 MED ORDER — LISINOPRIL 5 MG PO TABS: 5.0000 mg | ORAL_TABLET | Freq: Every day | ORAL | 1 refills | Status: DC

## 2024-01-26 ENCOUNTER — Other Ambulatory Visit: Payer: Self-pay | Admitting: Family Medicine

## 2024-01-26 DIAGNOSIS — F32A Depression, unspecified: Secondary | ICD-10-CM

## 2024-01-26 NOTE — Telephone Encounter (Signed)
 Please advise

## 2024-02-27 ENCOUNTER — Other Ambulatory Visit: Payer: Self-pay | Admitting: Family Medicine

## 2024-02-27 DIAGNOSIS — K219 Gastro-esophageal reflux disease without esophagitis: Secondary | ICD-10-CM

## 2024-02-28 NOTE — Telephone Encounter (Signed)
 Requested Prescriptions  Pending Prescriptions Disp Refills   esomeprazole (NEXIUM) 40 MG capsule [Pharmacy Med Name: ESOMEPRAZOLE MAG DR 40 MG CAP] 90 capsule 0    Sig: TAKE 1 CAPSULE (40 MG TOTAL) BY MOUTH DAILY.     Gastroenterology: Proton Pump Inhibitors 2 Passed - 02/28/2024  8:28 AM      Passed - ALT in normal range and within 360 days    ALT  Date Value Ref Range Status  11/07/2023 21 0 - 32 IU/L Final         Passed - AST in normal range and within 360 days    AST  Date Value Ref Range Status  11/07/2023 18 0 - 40 IU/L Final         Passed - Valid encounter within last 12 months    Recent Outpatient Visits           3 months ago Hypertension associated with diabetes [facility])   [place] [facility] Simmons-Robinson, [place], MD   9 months ago Bilateral foot pain   [place] [facility] Alfredia Ferguson, PA-C   1 year ago Allergic reaction to drug, initial encounter   [facility] [place], Marzella Schlein, MD   1 year ago Other hyperlipidemia   [place] [facility] Simmons-Robinson, [place], MD   1 year ago Neck pain   [place] [facility] Alfredia Ferguson, PA-C       Future Appointments             In 1 week Simmons-Robinson, Tawanna Cooler, MD [facility]

## 2024-03-06 ENCOUNTER — Ambulatory Visit: Payer: BC Managed Care – PPO | Admitting: Family Medicine

## 2024-03-06 ENCOUNTER — Encounter: Payer: Self-pay | Admitting: Family Medicine

## 2024-03-06 VITALS — BP 134/83 | HR 73 | Temp 97.6°F | Ht 63.0 in | Wt 151.0 lb

## 2024-03-06 DIAGNOSIS — E119 Type 2 diabetes mellitus without complications: Secondary | ICD-10-CM

## 2024-03-06 DIAGNOSIS — E1169 Type 2 diabetes mellitus with other specified complication: Secondary | ICD-10-CM

## 2024-03-06 DIAGNOSIS — E1159 Type 2 diabetes mellitus with other circulatory complications: Secondary | ICD-10-CM | POA: Diagnosis not present

## 2024-03-06 DIAGNOSIS — E559 Vitamin D deficiency, unspecified: Secondary | ICD-10-CM | POA: Diagnosis not present

## 2024-03-06 DIAGNOSIS — T7840XA Allergy, unspecified, initial encounter: Secondary | ICD-10-CM | POA: Diagnosis not present

## 2024-03-06 DIAGNOSIS — E785 Hyperlipidemia, unspecified: Secondary | ICD-10-CM

## 2024-03-06 DIAGNOSIS — Z7984 Long term (current) use of oral hypoglycemic drugs: Secondary | ICD-10-CM | POA: Diagnosis not present

## 2024-03-06 DIAGNOSIS — I152 Hypertension secondary to endocrine disorders: Secondary | ICD-10-CM

## 2024-03-06 DIAGNOSIS — F419 Anxiety disorder, unspecified: Secondary | ICD-10-CM

## 2024-03-06 DIAGNOSIS — K219 Gastro-esophageal reflux disease without esophagitis: Secondary | ICD-10-CM

## 2024-03-06 MED ORDER — ESOMEPRAZOLE MAGNESIUM 40 MG PO CPDR: 40.0000 mg | DELAYED_RELEASE_CAPSULE | Freq: Every day | ORAL | 2 refills | Status: DC

## 2024-03-06 MED ORDER — METHYLPREDNISOLONE ACETATE 40 MG/ML IJ SUSP
40.0000 mg | Freq: Once | INTRAMUSCULAR | Status: AC
  Administered 2024-03-06: 40 mg via INTRAMUSCULAR

## 2024-03-06 MED ORDER — HYDROXYZINE PAMOATE 25 MG PO CAPS
25.0000 mg | ORAL_CAPSULE | Freq: Three times a day (TID) | ORAL | 0 refills | Status: AC | PRN
Start: 2024-03-06 — End: ?

## 2024-03-06 MED ORDER — CETIRIZINE HCL 10 MG PO TABS: 10.0000 mg | ORAL_TABLET | Freq: Every day | ORAL | 11 refills | Status: AC

## 2024-03-06 NOTE — Assessment & Plan Note (Signed)
 Chronic GERD managed with Nexium. Symptoms worsen significantly if medication is missed. - Continue Nexium 40 mg daily - Send prescription to [facility]

## 2024-03-06 NOTE — Patient Instructions (Signed)
 VISIT SUMMARY:  Today, you were seen for an allergic reaction involving swelling and redness of your lips. You also reported itching in your tongue and throat, and diarrhea. We discussed your history of allergies and reviewed your current medications. Additionally, we addressed your chronic conditions, including diabetes, hypertension, high cholesterol, acid reflux, anxiety, depression, and vitamin D deficiency.  YOUR PLAN:  -ACUTE ALLERGIC REACTION: An acute allergic reaction is a sudden response by your immune system to a substance it considers harmful. You experienced swelling and redness of your lips, likely triggered by either clobetasol cream or salt and vinegar chips. We administered a Depo-Medrol injection in the clinic and prescribed cetirizine 10 mg daily and hydroxyzine 25 mg at night for one week. Please avoid potential allergens, including salt and vinegar chips, and return for evaluation if symptoms do not improve in one week.  -DIABETES MELLITUS: Diabetes mellitus is a condition where your blood sugar levels are too high. We will continue your metformin 750 mg daily and have ordered an A1c test and a complete metabolic panel to assess your control. Please schedule a follow-up in three months and obtain an updated eye exam.  -HYPERTENSION: Hypertension is high blood pressure. Your condition is well-controlled with lisinopril. Continue taking lisinopril 5 mg daily.  -HYPERLIPIDEMIA: Hyperlipidemia is high cholesterol. We will continue managing it with Zetia 10 mg daily and have ordered a lipid panel to monitor your cholesterol levels.  -GASTROESOPHAGEAL REFLUX DISEASE (GERD): GERD is a condition where stomach acid frequently flows back into the tube connecting your mouth and stomach. Continue taking Nexium 40 mg daily. We have sent a prescription to [facility].  -ANXIETY AND DEPRESSION: Anxiety and depression are mental health conditions that affect your mood and behavior. Continue taking  Lexapro 20 mg daily. We have also prescribed hydroxyzine 25 mg at night for one week to help manage your anxiety.  -VITAMIN D DEFICIENCY: Vitamin D deficiency means you have low levels of vitamin D, which is important for bone health. We have ordered a vitamin D level test to check your current levels.  INSTRUCTIONS:  Please return for evaluation if your allergic reaction symptoms do not improve in one week. Schedule a follow-up appointment in three months for diabetes management and obtain an updated eye exam. Additionally, we have ordered several tests: an A1c test, a complete metabolic panel, a lipid panel, and a vitamin D level test.

## 2024-03-06 NOTE — Assessment & Plan Note (Signed)
 Chronic hypertension, well-controlled with lisinopril. Blood pressure reading at 134/83 mmHg. - Continue lisinopril 5 mg daily

## 2024-03-06 NOTE — Progress Notes (Signed)
 Established patient visit   Patient: Hailey Horton   DOB: 1961/03/14   63 y.o. Female  MRN: [id number] Visit Date: 03/06/2024  Today's healthcare provider: Ronnald Ramp, MD   Chief Complaint  Patient presents with   Hypertension    Patient does not check blood pressure outside of the office   Diabetes    Patient reports she has not been checking her glucose at home   Allergic Reaction    Patient also complains of lips swelling burning and itching since yesterday.  She states she ate some salt and vinegar chips, but also may have gotten some cream on her lips that could be causing the issuse.   Subjective     HPI     Hypertension    Additional comments: Patient does not check blood pressure outside of the office        Diabetes    Additional comments: Patient reports she has not been checking her glucose at home        Allergic Reaction    Additional comments: Patient also complains of lips swelling burning and itching since yesterday.  She states she ate some salt and vinegar chips, but also may have gotten some cream on her lips that could be causing the issuse.      Last edited by Adline Peals, CMA on 03/06/2024  9:49 AM.       Discussed the use of AI scribe software for clinical note transcription with the patient, who gave verbal consent to proceed.  History of Present Illness   The patient presents with an allergic reaction involving lip swelling and erythema.  She has been experiencing swelling and soreness of her lips since last night around 6 PM. She attributes the onset of symptoms to possibly touching her face after applying clobetasol cream to her husband's leg and consuming salt and vinegar chips shortly thereafter. Her lips became swollen and painful after eating the chips, which she has consumed before without issue.  She describes a sensation of itching in her tongue and throat earlier, similar to the feeling in her lips. No  current wheezing or shortness of breath. She took Benadryl without relief of symptoms.  She experienced diarrhea three times yesterday afternoon, which she associates with the allergic reaction.  She has a history of allergies and mentions that she has 'sneaky allergies' to various substances. She has not taken any antibiotics recently and has not experienced any new exposures aside from the chips and clobetasol cream.  Her current medications include Nexium 40 mg for reflux, Zetia 10 mg for cholesterol, and hydroxyzine 25 mg as needed for anxiety, which she uses about once a month. She also takes Lexapro 20 mg for anxiety and depression. She is not currently taking vitamin D supplements.         Past Medical History:  Diagnosis Date   Allergy    Anxiety    Bronchitis    Bulging lumbar disc    DDD (degenerative disc disease), cervical    Depression    Diabetes mellitus without complication (HCC)    GERD (gastroesophageal reflux disease)    Hepatitis A    History of anemia    teenager   History of colon polyps    History of Lyme disease    History of [place] spotted fever    Hyperlipidemia    Hypertension    IBS (irritable bowel syndrome)    Left leg numbness  NASH (nonalcoholic steatohepatitis)    Need for influenza vaccination 09/22/2023   Otitis media    Left   PONV (postoperative nausea and vomiting)    prolonged sedation, blurry vision and headache for 6 months after   Sleep apnea    mild, no machine prescribed at this time   Tick bite    history of   TMJ (dislocation of temporomandibular joint)    VIN II (vulvar intraepithelial neoplasia II)    Wears glasses     Medications: Outpatient Medications Prior to Visit  Medication Sig   calcium carbonate (TUMS - DOSED IN MG ELEMENTAL CALCIUM) 500 MG chewable tablet Chew 1 tablet by mouth as needed for indigestion or heartburn.   escitalopram (LEXAPRO) 20 MG tablet TAKE 1 TABLET BY MOUTH EVERY DAY   ezetimibe  (ZETIA) 10 MG tablet Take 1 tablet (10 mg total) by mouth daily.   lisinopril (ZESTRIL) 5 MG tablet Take 1 tablet (5 mg total) by mouth daily.   metFORMIN (GLUCOPHAGE-XR) 750 MG 24 hr tablet TAKE 1 TABLET BY MOUTH EVERY DAY WITH BREAKFAST   naproxen sodium (ALEVE) 220 MG tablet Take 220 mg by mouth daily as needed.   [DISCONTINUED] esomeprazole (NEXIUM) 40 MG capsule TAKE 1 CAPSULE (40 MG TOTAL) BY MOUTH DAILY.   [DISCONTINUED] hydrOXYzine (VISTARIL) 25 MG capsule Take 1 capsule (25 mg total) by mouth every 8 (eight) hours as needed for anxiety.   [DISCONTINUED] cyclobenzaprine (FLEXERIL) 5 MG tablet Take 1 tablet (5 mg total) by mouth 3 (three) times daily as needed for muscle spasms.   [DISCONTINUED] lidocaine (LIDODERM) 5 % Place 1 patch onto the skin every 12 (twelve) hours. Remove & Discard patch within 12 hours or as directed by MD   No facility-administered medications prior to visit.    Review of Systems  Last CBC Lab Results  Component Value Date   WBC 6.4 11/08/2023   HGB 9.9 (L) 11/08/2023   HCT 32.4 (L) 11/08/2023   MCV 77.0 (L) 11/08/2023   MCH 23.5 (L) 11/08/2023   RDW 16.7 (H) 11/08/2023   PLT 367 11/08/2023   Last metabolic panel Lab Results  Component Value Date   GLUCOSE 83 11/08/2023   NA 138 11/08/2023   K 4.2 11/08/2023   CL 105 11/08/2023   CO2 24 11/08/2023   BUN 22 11/08/2023   CREATININE 1.00 11/08/2023   GFRNONAA >60 11/08/2023   CALCIUM 8.6 (L) 11/08/2023   PROT 6.3 11/07/2023   ALBUMIN 4.1 11/07/2023   LABGLOB 2.2 11/07/2023   AGRATIO 1.7 05/26/2023   BILITOT <0.2 11/07/2023   ALKPHOS 99 11/07/2023   AST 18 11/07/2023   ALT 21 11/07/2023   ANIONGAP 9 11/08/2023   Last lipids Lab Results  Component Value Date   CHOL 242 (H) 11/07/2023   HDL 50 11/07/2023   LDLCALC 139 (H) 11/07/2023   TRIG 295 (H) 11/07/2023   CHOLHDL 4.8 (H) 11/07/2023   The 10-year ASCVD risk score (Arnett DK, et al., 2019) is: 15.9%  Last hemoglobin A1c Lab  Results  Component Value Date   HGBA1C 7.2 (H) 11/07/2023   Last thyroid functions Lab Results  Component Value Date   TSH 2.760 06/21/2022   Last vitamin D Lab Results  Component Value Date   VD25OH 52.5 09/16/2020        Objective    BP 134/83 (BP Location: Right Arm, Patient Position: Sitting)   Pulse 73   Temp 97.6 F (36.4 C) (Oral)   Ht  5\' 3"  (1.6 m)   Wt 151 lb (68.5 kg)   LMP 10/25/2009 (Approximate) Comment: tubal ligation  SpO2 100%   BMI 26.75 kg/m  BP Readings from Last 3 Encounters:  03/06/24 134/83  11/08/23 (!) 141/75  11/07/23 138/84   Wt Readings from Last 3 Encounters:  03/06/24 151 lb (68.5 kg)  11/08/23 151 lb (68.5 kg)  11/07/23 151 lb 1.6 oz (68.5 kg)        Physical Exam  Physical Exam   VITALS: BP- 134/83 GENERAL: No acute distress, speaking in complete sentences. HEENT: Edema of bilateral lips with perioral erythema. No oral lesions. CHEST: No wheezing or crackles. Regular respiratory effort on room air. CARDIOVASCULAR: Regular rhythm, no murmurs. ABDOMEN: Normal bowel sounds.       No results found for any visits on 03/06/24.  Assessment & Plan     Problem List Items Addressed This Visit       Cardiovascular and Mediastinum   Hypertension associated with diabetes (HCC) - Primary   Chronic hypertension, well-controlled with lisinopril. Blood pressure reading at 134/83 mmHg. - Continue lisinopril 5 mg daily      Relevant Orders   CMP14+EGFR     Digestive   GERD   Chronic GERD managed with Nexium. Symptoms worsen significantly if medication is missed. - Continue Nexium 40 mg daily - [facility]      Relevant Medications   esomeprazole (NEXIUM) 40 MG capsule     Endocrine   Hyperlipidemia associated with type 2 diabetes mellitus (HCC)   Chronic hyperlipidemia managed with Zetia. Statins and Repatha are not options due to intolerance. Lipid panel will be checked to monitor cholesterol levels. -  Continue Zetia 10 mg daily - Order lipid panel      Relevant Orders   Lipid panel   Controlled type 2 diabetes mellitus without complication, without long-term current use of insulin (HCC)   Chronic diabetes management with metformin. A1c and complete metabolic panel to be checked to assess control. Blood pressure and cholesterol levels will be monitored as part of comprehensive care. - Continue metformin 750 mg daily - Order A1c test - Order complete metabolic panel - Schedule follow-up in three months - Advise to obtain an updated eye exam      Relevant Orders   Hemoglobin A1c   CMP14+EGFR     Other   Avitaminosis D   Relevant Orders   Vitamin D (25 hydroxy)   Anxiety   Relevant Medications   hydrOXYzine (VISTARIL) 25 MG capsule   Other Visit Diagnoses       Allergic reaction, initial encounter       Relevant Medications   methylPREDNISolone acetate (DEPO-MEDROL) injection 40 mg   cetirizine (ZYRTEC) 10 MG tablet   hydrOXYzine (VISTARIL) 25 MG capsule          Acute Allergic Reaction Acute allergic reaction with bilateral lip edema, perioral erythema, and itching. Potential triggers include clobetasol cream or salt and vinegar chips, possibly containing peanut oil. No wheezing, shortness of breath, or swollen tongue. Differential includes anaphylactic reaction due to food or topical exposure. - Administer Depo-Medrol 40 mg once in the clinic - Prescribe cetirizine 10 mg daily for one week - Prescribe hydroxyzine 25 mg at night for one week - Advise to avoid potential allergens, including salt and vinegar chips - Instruct to return for evaluation if symptoms do not improve in one week    Anxiety and Depression Chronic anxiety and depression managed with  Lexapro and hydroxyzine. Anxiety episodes occur approximately once a month. - Continue Lexapro 20 mg daily - Prescribe hydroxyzine 25 mg at night for one week  Vitamin D Deficiency Chronic vitamin D deficiency.  Not currently on supplementation. Vitamin D levels have not been checked since 2021. - Order vitamin D level test         Return in about 4 months (around 07/06/2024) for HTN, DM, Anxiety.         Ronnald Ramp, MD  [facility] [phone] (phone) [phone] (fax)  [facility]

## 2024-03-06 NOTE — Assessment & Plan Note (Signed)
 Chronic hyperlipidemia managed with Zetia. Statins and Repatha are not options due to intolerance. Lipid panel will be checked to monitor cholesterol levels. - Continue Zetia 10 mg daily - Order lipid panel

## 2024-03-06 NOTE — Assessment & Plan Note (Signed)
 Chronic diabetes management with metformin. A1c and complete metabolic panel to be checked to assess control. Blood pressure and cholesterol levels will be monitored as part of comprehensive care. - Continue metformin 750 mg daily - Order A1c test - Order complete metabolic panel - Schedule follow-up in three months - Advise to obtain an updated eye exam

## 2024-03-07 ENCOUNTER — Encounter: Payer: Self-pay | Admitting: Family Medicine

## 2024-03-07 LAB — LIPID PANEL
Chol/HDL Ratio: 4.4 ratio (ref 0.0–4.4)
Cholesterol, Total: 213 mg/dL — ABNORMAL HIGH (ref 100–199)
HDL: 48 mg/dL (ref >39)
LDL Chol Calc (NIH): 116 mg/dL — ABNORMAL HIGH (ref 0–99)
Triglycerides: 285 mg/dL — ABNORMAL HIGH (ref 0–149)
VLDL Cholesterol Cal: 49 mg/dL — ABNORMAL HIGH (ref 5–40)

## 2024-03-07 LAB — CMP14+EGFR
ALT: 37 IU/L — ABNORMAL HIGH (ref 0–32)
AST: 26 IU/L (ref 0–40)
Albumin: 4.1 g/dL (ref 3.9–4.9)
Alkaline Phosphatase: 106 IU/L (ref 44–121)
BUN/Creatinine Ratio: 15 (ref 12–28)
BUN: 20 mg/dL (ref 8–27)
Bilirubin Total: <0.2 mg/dL (ref 0.0–1.2)
CO2: 22 mmol/L (ref 20–29)
Calcium: 9.7 mg/dL (ref 8.7–10.3)
Chloride: 106 mmol/L (ref 96–106)
Creatinine, Ser: 1.31 mg/dL — ABNORMAL HIGH (ref 0.57–1.00)
Globulin, Total: 2.5 g/dL (ref 1.5–4.5)
Glucose: 83 mg/dL (ref 70–99)
Potassium: 4.9 mmol/L (ref 3.5–5.2)
Sodium: 142 mmol/L (ref 134–144)
Total Protein: 6.6 g/dL (ref 6.0–8.5)
eGFR: 45 mL/min/{1.73_m2} — ABNORMAL LOW (ref >59)

## 2024-03-07 LAB — HEMOGLOBIN A1C
Est. average glucose Bld gHb Est-mCnc: 160 mg/dL
Hgb A1c MFr Bld: 7.2 % — ABNORMAL HIGH (ref 4.8–5.6)

## 2024-03-07 LAB — VITAMIN D 25 HYDROXY (VIT D DEFICIENCY, FRACTURES): Vit D, 25-Hydroxy: 23.6 ng/mL — ABNORMAL LOW (ref 30.0–100.0)

## 2024-03-15 ENCOUNTER — Ambulatory Visit: Payer: Self-pay | Admitting: *Deleted

## 2024-03-15 NOTE — Telephone Encounter (Signed)
 Would recommend taking a dose of Benadryl and being evaluated in office for same day visit if available slot as she may need steroid injection to stop current reaction.  She will need ED urgent evaluation if she has difficulty with breathing, starting vomiting/diarrhea along with swelling of lips.   The cetirizine would have likely caused an allergic reaction sooner if she has been taking it for over a week but it is ok to hold the cetirizine for now.

## 2024-03-15 NOTE — Telephone Encounter (Signed)
 Copied from CRM [id number]. Topic: Clinical - Red Word Triage >> Mar 15, 2024  8:15 AM Alessandra Bevels wrote: Red Word that prompted transfer to Nurse Triage: Patient is calling to report that she was seen in the office on 03/06/24 Possible allergic reaction. Pain and itching around her lips. That feels warm. Please advise Reason for Disposition  [1] Caller has URGENT medicine question about med that PCP or specialist prescribed AND [2] triager unable to answer question  Answer Assessment - Initial Assessment Questions 1. NAME of MEDICINE: "What medicine(s) are you calling about?"     I'm having pain and itching around my lips and they are red.  They feel warm.  Started yesterday. I started on a new allergy medicine.   It started with an A.    Not seen in chart.   Looks like Zyrtec was started.    2. QUESTION: "What is your question?" (e.g., double dose of medicine, side effect)     My lips and mild swollen and my throat is scratchy.    When I'm doing something I breath a little heavy.   That's new for me.   3. PRESCRIBER: "Who prescribed the medicine?" Reason: if prescribed by specialist, call should be referred to that group.     Dr. Roxan Hockey   4. SYMPTOMS: "Do you have any symptoms?" If Yes, ask: "What symptoms are you having?"  "How bad are the symptoms (e.g., mild, moderate, severe)     See above 5. PREGNANCY:  "Is there any chance that you are pregnant?" "When was your last menstrual period?"     N/A due to age  Protocols used: Medication Question Call-A-AH  Chief Complaint: Possible allergic reaction.   (Seen by Dr. Roxan Hockey on 03/06/2024 and these same symptoms were mentioned in OV note).   Pt did not mention she was having these symptoms when she was seen.   She only told me she was started on Zyrtec and hydroxyzine was to be taken nightly now instead of as needed.  Throat is a little scratchy but denies it being swollen. Symptoms: Lips are itching and mildly swollen and feel warm.   Eyes are  also itching and feel dry.  These symptoms started day before yesterday (Wed). Frequency: daily. Pertinent Negatives: Patient denies chest tightness, wheezing, no tongue swelling and denies throat feeling like it's trying to close.   Has mild shortness of breath for the last couple of days that is new for her. Disposition: [] ED /[] Urgent Care (no appt availability in office) / [] Appointment(In office/virtual)/ []  [facility]/ [] Home Care/ [] Refused Recommended Disposition /[]  Mobile Bus/ [x]  Follow-up with PCP Additional Notes: High priority message sent to Dr. Roxan Hockey.   Pt. Agreeable to someone calling her back with Dr. Danella Penton recommendations.    I instructed her to hold hold on the Zyrtec since it's the only new medication she has been prescribed until hearing further from Dr. Roxan Hockey.   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  I instructed her to take some Benadryl when she got home.   She was agreeable to this.        I also went over signs/symptoms to go to the ED;  shortness of breath, feeling like her throat is closing, wheezing, chest tightness, lips swelling becomes worse or tongue swells.   She verbalized understanding.

## 2024-03-15 NOTE — Telephone Encounter (Signed)
 FYI Called and was able to inform the pt of the message per Dr Roxan Hockey. She stated has already taken a Benadryl and it only made her sleepy, she is not feeling any better but she is not feeling worst either, she has been advised to scheduled an appt if things turn for the worst

## 2024-04-11 ENCOUNTER — Ambulatory Visit: Admitting: Family Medicine

## 2024-06-14 ENCOUNTER — Other Ambulatory Visit: Payer: Self-pay | Admitting: Family Medicine

## 2024-06-19 ENCOUNTER — Other Ambulatory Visit: Payer: Self-pay | Admitting: Physician Assistant

## 2024-06-19 DIAGNOSIS — E1169 Type 2 diabetes mellitus with other specified complication: Secondary | ICD-10-CM

## 2024-06-20 ENCOUNTER — Other Ambulatory Visit: Payer: Self-pay | Admitting: Family Medicine

## 2024-06-20 DIAGNOSIS — E1169 Type 2 diabetes mellitus with other specified complication: Secondary | ICD-10-CM

## 2024-06-20 NOTE — Telephone Encounter (Signed)
 Copied from CRM [id number]. Topic: Clinical - Medication Refill >> Jun 20, 2024  2:12 PM Elle L wrote: Medication: ezetimibe  (ZETIA ) 10 MG tablet  Has the patient contacted their pharmacy? Yes  This is the patient's preferred pharmacy:  [id number] - [place], [place] - [address] AT [facility] OF [address] [address] [place] [address] Phone: [phone] Fax: [phone]  Is this the correct pharmacy for this prescription? Yes  Has the prescription been filled recently? Yes  Is the patient out of the medication? Yes  Has the patient been seen for an appointment in the last year OR does the patient have an upcoming appointment? Yes  Can we respond through MyChart? Yes  Agent: Please be advised that Rx refills may take up to 3 business days. We ask that you follow-up with your pharmacy.

## 2024-06-21 NOTE — Telephone Encounter (Signed)
 Requested Prescriptions  Refused Prescriptions Disp Refills   ezetimibe  (ZETIA ) 10 MG tablet 90 tablet 3    Sig: Take 1 tablet (10 mg total) by mouth daily.     Cardiovascular:  Antilipid - Sterol Transport Inhibitors Failed - 06/21/2024  2:06 PM      Failed - ALT in normal range and within 360 days    ALT  Date Value Ref Range Status  03/06/2024 37 (H) 0 - 32 IU/L Final         Failed - Lipid Panel in normal range within the last 12 months    Cholesterol, Total  Date Value Ref Range Status  03/06/2024 213 (H) 100 - 199 mg/dL Final   LDL Chol Calc (NIH)  Date Value Ref Range Status  03/06/2024 116 (H) 0 - 99 mg/dL Final   HDL  Date Value Ref Range Status  03/06/2024 48 >39 mg/dL Final   Triglycerides  Date Value Ref Range Status  03/06/2024 285 (H) 0 - 149 mg/dL Final         Passed - AST in normal range and within 360 days    AST  Date Value Ref Range Status  03/06/2024 26 0 - 40 IU/L Final         Passed - Patient is not pregnant      Passed - Valid encounter within last 12 months    Recent Outpatient Visits           3 months ago Hypertension associated with diabetes (HCC)   [facility] Simmons-Robinson, Financial controller, MD       Future Appointments             In 2 weeks Simmons-Robinson, Rockie, MD [facility]

## 2024-07-08 ENCOUNTER — Ambulatory Visit: Admitting: Family Medicine

## 2024-08-12 ENCOUNTER — Other Ambulatory Visit: Payer: Self-pay | Admitting: Family Medicine

## 2024-08-12 DIAGNOSIS — F32A Depression, unspecified: Secondary | ICD-10-CM

## 2024-09-03 ENCOUNTER — Other Ambulatory Visit: Payer: Self-pay | Admitting: Family Medicine

## 2024-09-03 DIAGNOSIS — K219 Gastro-esophageal reflux disease without esophagitis: Secondary | ICD-10-CM

## 2024-09-23 ENCOUNTER — Other Ambulatory Visit: Payer: Self-pay | Admitting: Family Medicine

## 2024-09-23 DIAGNOSIS — I10 Essential (primary) hypertension: Secondary | ICD-10-CM

## 2024-09-23 NOTE — Telephone Encounter (Unsigned)
 Copied from CRM #[id number]. Topic: Clinical - Medication Refill >> Sep 23, 2024 11:59 AM Mia F wrote: Medication: lisinopril  (ZESTRIL ) 5 MG tablet   Has the patient contacted their pharmacy? Yes (Agent: If no, request that the patient contact the pharmacy for the refill. If patient does not wish to contact the pharmacy document the reason why and proceed with request.) (Agent: If yes, when and what did the pharmacy advise?)  This is the patient's preferred pharmacy:  [id number] [place], [place] - [address] AT [facility] OF [address] [place] [address] [place] [address] Phone: [phone] Fax: [phone]  [id number] - [place], [place] - [address] [place] [address] Phone: [phone] Fax: [phone]  [facility] [address], [place] - [address] [place] [address] Phone: [phone] Fax: [phone]  Is this the correct pharmacy for this prescription? Yes If no, delete pharmacy and type the correct one.   Has the prescription been filled recently? No  Is the patient out of the medication? No- 1 day left  Has the patient been seen for an appointment in the last year OR does the patient have an upcoming appointment? Yes  Can we respond through MyChart? Yes  Agent: Please be advised that Rx refills may take up to 3 business days. We ask that you follow-up with your pharmacy.

## 2024-09-25 MED ORDER — LISINOPRIL 5 MG PO TABS: 5.0000 mg | ORAL_TABLET | Freq: Every day | ORAL | 0 refills | Status: DC

## 2024-09-25 NOTE — Telephone Encounter (Signed)
 Attempted to call patient to schedule appointment- no answer- left message to call office for appointment. Courtesy 30 day Rx sent to pharmacy. Requested Prescriptions  Pending Prescriptions Disp Refills   lisinopril  (ZESTRIL ) 5 MG tablet 90 tablet 1    Sig: Take 1 tablet (5 mg total) by mouth daily.     Cardiovascular:  ACE Inhibitors Failed - 09/25/2024 11:28 AM      Failed - Cr in normal range and within 180 days    Creatinine, Ser  Date Value Ref Range Status  03/06/2024 1.31 (H) 0.57 - 1.00 mg/dL Final         Failed - K in normal range and within 180 days    Potassium  Date Value Ref Range Status  03/06/2024 4.9 3.5 - 5.2 mmol/L Final         Failed - Valid encounter within last 6 months    Recent Outpatient Visits           6 months ago Hypertension associated with diabetes [facility])   [place] [facility] Simmons-Robinson, [place], MD              Passed - Patient is not pregnant      Passed - Last BP in normal range    BP Readings from Last 1 Encounters:  03/06/24 134/83

## 2024-09-26 ENCOUNTER — Ambulatory Visit: Payer: Self-pay

## 2024-09-26 NOTE — Telephone Encounter (Signed)
 FYI Only or Action Required?: FYI only for provider.  Patient was last seen in primary care on 03/06/2024 by Sharma Coyer, MD.  Called Nurse Triage reporting Foot Injury.  Symptoms began several days ago.  Interventions attempted: OTC medications: Tylenol /Aleve , Rest, hydration, or home remedies, and Ice/heat application.  Symptoms are: unchanged.  Triage Disposition: See Physician Within 24 Hours  Patient/caregiver understands and will follow disposition?: Yes   Copied from CRM [id number]. Topic: Clinical - Red Word Triage >> Sep 26, 2024  3:55 PM Antony RAMAN wrote: Red Word that prompted transfer to Nurse Triage: fell and hurt foot in pain Reason for Disposition  [1] Limp when walking AND [2] due to a direct blow or crushing injury  Answer Assessment - Initial Assessment Questions Pin in that foot, Right foot the heal to ankle  Aleve /Tylenol - minimal to no relief  1. MECHANISM: How did the injury happen? (e.g., twisting injury, direct blow)      Almost fell walking into [employer] in crocs 2. ONSET: When did the injury happen? (Minutes or hours ago)      4-5 days ago  3. LOCATION: Where is the injury located?      Right heal to ankle  4. APPEARANCE of INJURY: What does the injury look like?      Not swollen, skin not broken  5. WEIGHT-BEARING: Can you put weight on that foot? Can you walk (four steps or more)?       FWB but hurts  6. SIZE: For cuts, bruises, or swelling, ask: How large is it? (e.g., inches or centimeters;  entire joint)      denies 7. PAIN: Is there pain? If Yes, ask: How bad is the pain?    (e.g., Scale 1-10; or mild, moderate, severe)   - NONE (0): no pain.   - MILD (1-3): doesn't interfere with normal activities.    - MODERATE (4-7): interferes with normal activities (e.g., work or school) or awakens from sleep, limping.    - SEVERE (8-10): excruciating pain, unable to do any normal activities, unable to walk.      8-9/10  when walking on and foot up 9. OTHER SYMPTOMS: Do you have any other symptoms?      denies  Protocols used: Ankle and Foot Injury-A-AH

## 2024-09-27 ENCOUNTER — Ambulatory Visit: Admitting: Family Medicine

## 2024-09-27 VITALS — BP 124/74 | HR 78 | Temp 98.2°F | Ht 63.0 in | Wt 146.0 lb

## 2024-09-27 DIAGNOSIS — I10 Essential (primary) hypertension: Secondary | ICD-10-CM | POA: Diagnosis not present

## 2024-09-27 DIAGNOSIS — Z23 Encounter for immunization: Secondary | ICD-10-CM

## 2024-09-27 DIAGNOSIS — M7750 Other enthesopathy of unspecified foot: Secondary | ICD-10-CM | POA: Diagnosis not present

## 2024-09-27 MED ORDER — LISINOPRIL 5 MG PO TABS: 5.0000 mg | ORAL_TABLET | Freq: Every day | ORAL | 0 refills | Status: DC

## 2024-09-27 MED ORDER — DICLOFENAC SODIUM 75 MG PO TBEC: 75.0000 mg | DELAYED_RELEASE_TABLET | Freq: Two times a day (BID) | ORAL | 0 refills | Status: DC

## 2024-09-27 NOTE — Progress Notes (Signed)
 Established patient visit   Patient: Hailey Horton   DOB: [birth year]   63 y.o. Female  MRN: [id number] Visit Date: 09/27/2024  Today's healthcare provider: Nancyann Perry, MD   Chief Complaint  Patient presents with   Foot Pain    Right foot pain-patient was wearing crocs in the rain when going into [employer] when she slipped.  This occurred 5 days ago.  She states it has not gotten better but it hasn't gotten worse.  Patient has not been seen for this issue.  She states it hurts when she walks on it but denies any swelling.  She does state her ROM is limited due to stiffness.   Subjective    Discussed the use of AI scribe software for clinical note transcription with the patient, who gave verbal consent to proceed.  History of Present Illness   Hailey Horton is a 63 year old female who presents with right foot pain following an injury.  She has been experiencing right foot pain for approximately five days after twisting her foot while wearing Crocs in the rain. The pain is located at the bend of the foot, extending to the heel and middle of the foot, and is more pronounced on the outside of the foot. There is also soreness on the right side of the ankle.  She has been taking Aleve  for pain relief, which provides some relief but the pain returns about an hour later. The pain is described as 'sore' when pressure is applied to the bottom of the foot and more sore on the outside. The big toe is particularly sensitive, and she has difficulty walking, sometimes having to drag her foot.  She has a history of a previous foot issue where the 'milk part was gone' and required monthly follow-ups, though it is unclear if this is related to her current condition. She also mentions taking medication for blood pressure, which she is currently out of, having taken only half a pill today.       Medications: Outpatient Medications Prior to Visit  Medication Sig   calcium  carbonate (TUMS -  DOSED IN MG ELEMENTAL CALCIUM ) 500 MG chewable tablet Chew 1 tablet by mouth as needed for indigestion or heartburn.   cetirizine  (ZYRTEC ) 10 MG tablet Take 1 tablet (10 mg total) by mouth daily.   cholecalciferol (VITAMIN D3) 25 MCG (1000 UNIT) tablet Take 1,000 Units by mouth daily.   escitalopram  (LEXAPRO ) 20 MG tablet TAKE 1 TABLET BY MOUTH EVERY DAY   esomeprazole  (NEXIUM ) 40 MG capsule Take 1 capsule (40 mg total) by mouth daily.   ezetimibe  (ZETIA ) 10 MG tablet TAKE 1 TABLET BY MOUTH EVERY DAY   hydrOXYzine  (VISTARIL ) 25 MG capsule Take 1 capsule (25 mg total) by mouth every 8 (eight) hours as needed for anxiety.   metFORMIN  (GLUCOPHAGE -XR) 750 MG 24 hr tablet TAKE 1 TABLET BY MOUTH EVERY DAY WITH BREAKFAST   naproxen  sodium (ALEVE ) 220 MG tablet Take 220 mg by mouth daily as needed.   lisinopril  (ZESTRIL ) 5 MG tablet Take 1 tablet (5 mg total) by mouth daily.   No facility-administered medications prior to visit.      Objective    BP 124/74 (BP Location: Left Arm, Patient Position: Sitting, Cuff Size: Normal)   Pulse 78   Temp 98.2 F (36.8 C) (Oral)   Ht 5' 3 (1.6 m)   Wt 146 lb (66.2 kg)   LMP 10/25/2009 (Approximate) Comment: tubal ligation  BMI 25.86 kg/m   Physical Exam  Mild tenderness along lateral aspect of foot. No gross deformities, no swelling.     Assessment & Plan        Right foot and ankle ligament strain Right foot and ankle ligament strain likely from twisting injury. Pain localized to foot and ankle. No swelling. Expected recovery in three weeks. - Prescribe diclofenac for pain management. - Advise against excessive walking or standing.  Essential hypertension She ran out of blood pressure medication and took a half dose today. - Send refill for blood pressure medication to [facility] in [place].  [facility] Maintenance Due for flu shot. - Administer flu shot.         Nancyann Perry, MD  [facility] [phone]  (phone) [phone] (fax)  [facility]

## 2024-10-06 ENCOUNTER — Other Ambulatory Visit: Payer: Self-pay | Admitting: Family Medicine

## 2024-11-11 ENCOUNTER — Telehealth: Payer: Self-pay

## 2024-11-11 DIAGNOSIS — E1159 Type 2 diabetes mellitus with other circulatory complications: Secondary | ICD-10-CM

## 2024-11-11 DIAGNOSIS — E119 Type 2 diabetes mellitus without complications: Secondary | ICD-10-CM

## 2024-11-11 NOTE — Telephone Encounter (Signed)
 Copied from CRM [id number]. Topic: Clinical - Request for Lab/Test Order >> Nov 08, 2024  4:19 PM Everette C wrote: Reason for CRM: The patient has called to request lab orders for testing their blood sugar and A1C

## 2024-11-11 NOTE — Telephone Encounter (Signed)
 Left vm for pt to call back to schedule appt.  Okay for E2C2 to schedule and give pt message as stated in previous message.

## 2024-11-11 NOTE — Telephone Encounter (Signed)
 Pt is due for chronic follow up visit. Ok to have labs drawn before visit

## 2024-11-11 NOTE — Telephone Encounter (Signed)
 LVM requesting patient call back to schedule f/u may have labs drawn before visit. Please retry calling patient to schedule f/u. \  (E2C2- if patient calls back please advise of message and schedule f/u an ask patient to have labs drawn at least 2 days prior to appt)

## 2024-11-11 NOTE — Telephone Encounter (Signed)
 Will try to call pt later this afternoon.

## 2024-11-22 ENCOUNTER — Other Ambulatory Visit: Payer: Self-pay | Admitting: Family Medicine

## 2024-12-03 ENCOUNTER — Other Ambulatory Visit: Payer: Self-pay | Admitting: Family Medicine

## 2024-12-03 DIAGNOSIS — K219 Gastro-esophageal reflux disease without esophagitis: Secondary | ICD-10-CM

## 2024-12-06 DIAGNOSIS — E119 Type 2 diabetes mellitus without complications: Secondary | ICD-10-CM | POA: Diagnosis not present

## 2024-12-06 DIAGNOSIS — I152 Hypertension secondary to endocrine disorders: Secondary | ICD-10-CM | POA: Diagnosis not present

## 2024-12-06 DIAGNOSIS — E1159 Type 2 diabetes mellitus with other circulatory complications: Secondary | ICD-10-CM | POA: Diagnosis not present

## 2024-12-07 ENCOUNTER — Ambulatory Visit: Payer: Self-pay | Admitting: Family Medicine

## 2024-12-07 LAB — COMPREHENSIVE METABOLIC PANEL WITH GFR
ALT: 46 IU/L — ABNORMAL HIGH (ref 0–32)
AST: 30 IU/L (ref 0–40)
Albumin: 4.3 g/dL (ref 3.9–4.9)
Alkaline Phosphatase: 112 IU/L (ref 49–135)
BUN/Creatinine Ratio: 23 (ref 12–28)
BUN: 23 mg/dL (ref 8–27)
Bilirubin Total: <0.2 mg/dL (ref 0.0–1.2)
CO2: 22 mmol/L (ref 20–29)
Calcium: 9.8 mg/dL (ref 8.7–10.3)
Chloride: 103 mmol/L (ref 96–106)
Creatinine, Ser: 1.01 mg/dL — ABNORMAL HIGH (ref 0.57–1.00)
Globulin, Total: 2.5 g/dL (ref 1.5–4.5)
Glucose: 101 mg/dL — ABNORMAL HIGH (ref 70–99)
Potassium: 4.8 mmol/L (ref 3.5–5.2)
Sodium: 140 mmol/L (ref 134–144)
Total Protein: 6.8 g/dL (ref 6.0–8.5)
eGFR: 62 mL/min/1.73 (ref >59)

## 2024-12-07 LAB — HEMOGLOBIN A1C
Est. average glucose Bld gHb Est-mCnc: 151 mg/dL
Hgb A1c MFr Bld: 6.9 % — ABNORMAL HIGH (ref 4.8–5.6)

## 2024-12-31 ENCOUNTER — Other Ambulatory Visit: Payer: Self-pay | Admitting: Family Medicine

## 2024-12-31 DIAGNOSIS — I10 Essential (primary) hypertension: Secondary | ICD-10-CM

## 2024-12-31 DIAGNOSIS — E1169 Type 2 diabetes mellitus with other specified complication: Secondary | ICD-10-CM

## 2024-12-31 NOTE — Telephone Encounter (Signed)
 Copied from CRM [id number]. Topic: Clinical - Medication Refill >> Dec 31, 2024  1:16 PM Hadassah PARAS wrote: Medication: lisinopril  (ZESTRIL ) 5 MG tablet; also needs cholesterol medication filled (does not know name)   Has the patient contacted their pharmacy? Yes (Agent: If no, request that the patient contact the pharmacy for the refill. If patient does not wish to contact the pharmacy document the reason why and proceed with request.) (Agent: If yes, when and what did the pharmacy advise?)  This is the patient's preferred pharmacy:  [id number] [place], [place] - [address] AT [facility] OF [address] [place] [address] Phone: [phone] Fax: [phone]   Is this the correct pharmacy for this prescription? Yes If no, delete pharmacy and type the correct one.   Has the prescription been filled recently? No  Is the patient out of the medication? Yes  Has the patient been seen for an appointment in the last year OR does the patient have an upcoming appointment? Yes  Can we respond through MyChart? Yes  Agent: Please be advised that Rx refills may take up to 3 business days. We ask that you follow-up with your pharmacy.

## 2025-01-02 MED ORDER — LISINOPRIL 5 MG PO TABS: 5.0000 mg | ORAL_TABLET | Freq: Every day | ORAL | 0 refills | Status: AC

## 2025-01-02 NOTE — Telephone Encounter (Signed)
 Requested Prescriptions  Pending Prescriptions Disp Refills   lisinopril  (ZESTRIL ) 5 MG tablet 90 tablet 0    Sig: Take 1 tablet (5 mg total) by mouth daily.     Cardiovascular:  ACE Inhibitors Failed - 01/02/2025 10:10 AM      Failed - Cr in normal range and within 180 days    Creatinine, Ser  Date Value Ref Range Status  12/06/2024 1.01 (H) 0.57 - 1.00 mg/dL Final         Failed - Valid encounter within last 6 months    Recent Outpatient Visits           3 months ago Tendonitis of foot   [place] [facility] [place] Nancyann BRAVO, MD   10 months ago Hypertension associated with diabetes [facility])   [place] [facility] Simmons-Robinson, [place], MD              Passed - K in normal range and within 180 days    Potassium  Date Value Ref Range Status  12/06/2024 4.8 3.5 - 5.2 mmol/L Final         Passed - Patient is not pregnant      Passed - Last BP in normal range    BP Readings from Last 1 Encounters:  09/27/24 124/74         Refused Prescriptions Disp Refills   ezetimibe  (ZETIA ) 10 MG tablet 90 tablet 3    Sig: Take 1 tablet (10 mg total) by mouth daily.     Cardiovascular:  Antilipid - Sterol Transport Inhibitors Failed - 01/02/2025 10:10 AM      Failed - ALT in normal range and within 360 days    ALT  Date Value Ref Range Status  12/06/2024 46 (H) 0 - 32 IU/L Final         Failed - Lipid Panel in normal range within the last 12 months    Cholesterol, Total  Date Value Ref Range Status  03/06/2024 213 (H) 100 - 199 mg/dL Final   LDL Chol Calc (NIH)  Date Value Ref Range Status  03/06/2024 116 (H) 0 - 99 mg/dL Final   HDL  Date Value Ref Range Status  03/06/2024 48 >39 mg/dL Final   Triglycerides  Date Value Ref Range Status  03/06/2024 285 (H) 0 - 149 mg/dL Final         Passed - AST in normal range and within 360 days    AST  Date Value Ref Range Status  12/06/2024 30 0 - 40 IU/L Final         Passed - Patient  is not pregnant      Passed - Valid encounter within last 12 months    Recent Outpatient Visits           3 months ago Tendonitis of foot   [place] [facility] Nancyann BRAVO, MD   10 months ago Hypertension associated with diabetes [facility])   [place] [facility] Simmons-Robinson, [place], MD

## 2025-01-17 ENCOUNTER — Other Ambulatory Visit: Payer: Self-pay | Admitting: Family Medicine
